# Patient Record
Sex: Female | Born: 1945
Health system: Southern US, Community
[De-identification: ages and names within clinical notes are randomized; demographics above are authoritative.]

## PROBLEM LIST (undated history)

## (undated) DIAGNOSIS — K8020 Calculus of gallbladder without cholecystitis without obstruction: Secondary | ICD-10-CM

## (undated) DIAGNOSIS — N951 Menopausal and female climacteric states: Secondary | ICD-10-CM

## (undated) DIAGNOSIS — N3001 Acute cystitis with hematuria: Secondary | ICD-10-CM

## (undated) DIAGNOSIS — Z1329 Encounter for screening for other suspected endocrine disorder: Secondary | ICD-10-CM

## (undated) DIAGNOSIS — J42 Unspecified chronic bronchitis: Secondary | ICD-10-CM

## (undated) DIAGNOSIS — F329 Major depressive disorder, single episode, unspecified: Secondary | ICD-10-CM

## (undated) DIAGNOSIS — M5430 Sciatica, unspecified side: Secondary | ICD-10-CM

## (undated) DIAGNOSIS — IMO0001 Reserved for inherently not codable concepts without codable children: Secondary | ICD-10-CM

## (undated) DIAGNOSIS — F32A Depression, unspecified: Secondary | ICD-10-CM

## (undated) DIAGNOSIS — J3800 Paralysis of vocal cords and larynx, unspecified: Secondary | ICD-10-CM

## (undated) DIAGNOSIS — R220 Localized swelling, mass and lump, head: Secondary | ICD-10-CM

## (undated) DIAGNOSIS — F419 Anxiety disorder, unspecified: Secondary | ICD-10-CM

## (undated) DIAGNOSIS — E785 Hyperlipidemia, unspecified: Secondary | ICD-10-CM

## (undated) DIAGNOSIS — D1800 Hemangioma unspecified site: Secondary | ICD-10-CM

## (undated) DIAGNOSIS — I10 Essential (primary) hypertension: Secondary | ICD-10-CM

## (undated) DIAGNOSIS — K219 Gastro-esophageal reflux disease without esophagitis: Secondary | ICD-10-CM

## (undated) DIAGNOSIS — G5600 Carpal tunnel syndrome, unspecified upper limb: Secondary | ICD-10-CM

## (undated) DIAGNOSIS — Z5189 Encounter for other specified aftercare: Secondary | ICD-10-CM

## (undated) DIAGNOSIS — J449 Chronic obstructive pulmonary disease, unspecified: Secondary | ICD-10-CM

## (undated) DIAGNOSIS — R221 Localized swelling, mass and lump, neck: Secondary | ICD-10-CM

## (undated) HISTORY — PX: LUMBAR LAMINECTOMY: SHX95

## (undated) HISTORY — DX: Calculus of gallbladder without cholecystitis without obstruction: K80.20

## (undated) HISTORY — PX: OTHER SURGICAL HISTORY: SHX169

## (undated) HISTORY — DX: Hemangioma unspecified site: D18.00

## (undated) HISTORY — DX: Essential (primary) hypertension: I10

## (undated) HISTORY — DX: Acute cystitis with hematuria: N30.01

## (undated) HISTORY — DX: Sciatica, unspecified side: M54.30

## (undated) HISTORY — PX: CARPAL TUNNEL RELEASE: SHX101

## (undated) HISTORY — PX: CHOLECYSTECTOMY: SHX55

## (undated) HISTORY — DX: Hyperlipidemia, unspecified: E78.5

## (undated) HISTORY — PX: TUBAL LIGATION: SHX77

## (undated) HISTORY — DX: Menopausal and female climacteric states: N95.1

## (undated) HISTORY — DX: Encounter for screening for other suspected endocrine disorder: Z13.29

## (undated) HISTORY — PX: ABDOMINAL HYSTERECTOMY: SHX81

## (undated) HISTORY — DX: Depression, unspecified: F32.A

## (undated) HISTORY — DX: Encounter for other specified aftercare: Z51.89

## (undated) HISTORY — DX: Unspecified chronic bronchitis: J42

## (undated) HISTORY — DX: Reserved for inherently not codable concepts without codable children: IMO0001

## (undated) HISTORY — DX: Chronic obstructive pulmonary disease, unspecified: J44.9

## (undated) HISTORY — DX: Major depressive disorder, single episode, unspecified: F32.9

## (undated) HISTORY — DX: Anxiety disorder, unspecified: F41.9

## (undated) HISTORY — DX: Carpal tunnel syndrome, unspecified upper limb: G56.00

## (undated) HISTORY — DX: Gastro-esophageal reflux disease without esophagitis: K21.9

## (undated) HISTORY — DX: Localized swelling, mass and lump, neck: R22.1

## (undated) HISTORY — DX: Localized swelling, mass and lump, head: R22.0

## (undated) HISTORY — DX: Paralysis of vocal cords and larynx, unspecified: J38.00

---

## 1997-05-27 ENCOUNTER — Encounter (INDEPENDENT_AMBULATORY_CARE_PROVIDER_SITE_OTHER): Payer: Self-pay | Admitting: *Deleted

## 1998-04-23 ENCOUNTER — Ambulatory Visit (HOSPITAL_COMMUNITY): Admission: RE | Admit: 1998-04-23 | Discharge: 1998-04-23 | Payer: Self-pay | Admitting: *Deleted

## 1998-05-22 ENCOUNTER — Encounter: Admission: RE | Admit: 1998-05-22 | Discharge: 1998-05-22 | Payer: Self-pay | Admitting: Sports Medicine

## 1998-06-07 ENCOUNTER — Encounter: Admission: RE | Admit: 1998-06-07 | Discharge: 1998-06-07 | Payer: Self-pay | Admitting: Family Medicine

## 1998-10-17 ENCOUNTER — Encounter: Admission: RE | Admit: 1998-10-17 | Discharge: 1998-10-17 | Payer: Self-pay | Admitting: Sports Medicine

## 1999-02-25 ENCOUNTER — Encounter: Admission: RE | Admit: 1999-02-25 | Discharge: 1999-02-25 | Payer: Self-pay | Admitting: Family Medicine

## 1999-02-25 ENCOUNTER — Ambulatory Visit (HOSPITAL_COMMUNITY): Admission: RE | Admit: 1999-02-25 | Discharge: 1999-02-25 | Payer: Self-pay | Admitting: Family Medicine

## 1999-04-03 ENCOUNTER — Encounter: Admission: RE | Admit: 1999-04-03 | Discharge: 1999-04-03 | Payer: Self-pay | Admitting: Family Medicine

## 1999-06-13 ENCOUNTER — Encounter: Admission: RE | Admit: 1999-06-13 | Discharge: 1999-06-13 | Payer: Self-pay | Admitting: Family Medicine

## 1999-06-21 ENCOUNTER — Ambulatory Visit (HOSPITAL_COMMUNITY): Admission: RE | Admit: 1999-06-21 | Discharge: 1999-06-21 | Payer: Self-pay | Admitting: Family Medicine

## 1999-06-21 ENCOUNTER — Encounter: Payer: Self-pay | Admitting: Family Medicine

## 1999-08-22 ENCOUNTER — Encounter: Admission: RE | Admit: 1999-08-22 | Discharge: 1999-08-22 | Payer: Self-pay | Admitting: Family Medicine

## 1999-09-11 ENCOUNTER — Encounter: Admission: RE | Admit: 1999-09-11 | Discharge: 1999-09-11 | Payer: Self-pay | Admitting: Family Medicine

## 1999-10-28 DIAGNOSIS — D1800 Hemangioma unspecified site: Secondary | ICD-10-CM

## 1999-10-28 HISTORY — DX: Hemangioma unspecified site: D18.00

## 1999-12-12 ENCOUNTER — Encounter: Admission: RE | Admit: 1999-12-12 | Discharge: 1999-12-12 | Payer: Self-pay | Admitting: Family Medicine

## 2000-01-03 ENCOUNTER — Encounter: Admission: RE | Admit: 2000-01-03 | Discharge: 2000-01-03 | Payer: Self-pay | Admitting: Family Medicine

## 2000-01-15 ENCOUNTER — Encounter: Payer: Self-pay | Admitting: Orthopedic Surgery

## 2000-01-15 ENCOUNTER — Encounter: Admission: RE | Admit: 2000-01-15 | Discharge: 2000-01-15 | Payer: Self-pay | Admitting: Orthopedic Surgery

## 2000-01-29 ENCOUNTER — Ambulatory Visit (HOSPITAL_COMMUNITY): Admission: RE | Admit: 2000-01-29 | Discharge: 2000-01-29 | Payer: Self-pay | Admitting: Orthopedic Surgery

## 2000-01-29 ENCOUNTER — Encounter: Payer: Self-pay | Admitting: Orthopedic Surgery

## 2000-04-22 ENCOUNTER — Encounter: Admission: RE | Admit: 2000-04-22 | Discharge: 2000-04-22 | Payer: Self-pay | Admitting: Family Medicine

## 2000-06-23 ENCOUNTER — Encounter: Payer: Self-pay | Admitting: Family Medicine

## 2000-06-23 ENCOUNTER — Ambulatory Visit (HOSPITAL_COMMUNITY): Admission: RE | Admit: 2000-06-23 | Discharge: 2000-06-23 | Payer: Self-pay | Admitting: Sports Medicine

## 2000-10-06 ENCOUNTER — Encounter: Admission: RE | Admit: 2000-10-06 | Discharge: 2000-10-06 | Payer: Self-pay | Admitting: Family Medicine

## 2000-10-09 ENCOUNTER — Encounter: Payer: Self-pay | Admitting: Family Medicine

## 2000-10-09 ENCOUNTER — Encounter: Admission: RE | Admit: 2000-10-09 | Discharge: 2000-10-09 | Payer: Self-pay | Admitting: Family Medicine

## 2000-11-10 ENCOUNTER — Encounter: Admission: RE | Admit: 2000-11-10 | Discharge: 2000-11-10 | Payer: Self-pay | Admitting: Sports Medicine

## 2000-12-08 ENCOUNTER — Encounter: Admission: RE | Admit: 2000-12-08 | Discharge: 2000-12-08 | Payer: Self-pay | Admitting: Family Medicine

## 2000-12-31 ENCOUNTER — Encounter: Admission: RE | Admit: 2000-12-31 | Discharge: 2000-12-31 | Payer: Self-pay | Admitting: Family Medicine

## 2001-03-31 ENCOUNTER — Encounter: Admission: RE | Admit: 2001-03-31 | Discharge: 2001-03-31 | Payer: Self-pay | Admitting: Family Medicine

## 2001-04-07 ENCOUNTER — Encounter: Admission: RE | Admit: 2001-04-07 | Discharge: 2001-05-26 | Payer: Self-pay | Admitting: Sports Medicine

## 2001-05-27 ENCOUNTER — Encounter: Admission: RE | Admit: 2001-05-27 | Discharge: 2001-08-25 | Payer: Self-pay | Admitting: Sports Medicine

## 2001-09-07 ENCOUNTER — Encounter: Admission: RE | Admit: 2001-09-07 | Discharge: 2001-09-07 | Payer: Self-pay | Admitting: Family Medicine

## 2001-09-07 ENCOUNTER — Encounter: Payer: Self-pay | Admitting: Sports Medicine

## 2001-09-07 ENCOUNTER — Encounter: Admission: RE | Admit: 2001-09-07 | Discharge: 2001-09-07 | Payer: Self-pay | Admitting: Sports Medicine

## 2001-09-13 ENCOUNTER — Encounter: Admission: RE | Admit: 2001-09-13 | Discharge: 2001-09-13 | Payer: Self-pay | Admitting: Family Medicine

## 2001-09-21 ENCOUNTER — Ambulatory Visit (HOSPITAL_COMMUNITY): Admission: RE | Admit: 2001-09-21 | Discharge: 2001-09-21 | Payer: Self-pay | Admitting: Sports Medicine

## 2001-10-05 ENCOUNTER — Encounter: Admission: RE | Admit: 2001-10-05 | Discharge: 2001-10-05 | Payer: Self-pay | Admitting: Family Medicine

## 2002-01-07 ENCOUNTER — Encounter: Admission: RE | Admit: 2002-01-07 | Discharge: 2002-01-07 | Payer: Self-pay | Admitting: Family Medicine

## 2002-02-10 ENCOUNTER — Encounter: Admission: RE | Admit: 2002-02-10 | Discharge: 2002-02-10 | Payer: Self-pay | Admitting: Family Medicine

## 2002-03-08 ENCOUNTER — Encounter: Admission: RE | Admit: 2002-03-08 | Discharge: 2002-03-08 | Payer: Self-pay | Admitting: Sports Medicine

## 2002-03-08 ENCOUNTER — Encounter: Payer: Self-pay | Admitting: Sports Medicine

## 2002-03-08 ENCOUNTER — Encounter: Admission: RE | Admit: 2002-03-08 | Discharge: 2002-03-08 | Payer: Self-pay | Admitting: Family Medicine

## 2002-05-17 ENCOUNTER — Encounter: Admission: RE | Admit: 2002-05-17 | Discharge: 2002-05-17 | Payer: Self-pay | Admitting: Family Medicine

## 2002-05-20 ENCOUNTER — Encounter: Admission: RE | Admit: 2002-05-20 | Discharge: 2002-05-20 | Payer: Self-pay | Admitting: Neurosurgery

## 2002-05-20 ENCOUNTER — Encounter: Payer: Self-pay | Admitting: Neurosurgery

## 2002-06-03 ENCOUNTER — Encounter: Payer: Self-pay | Admitting: Neurosurgery

## 2002-06-03 ENCOUNTER — Encounter: Admission: RE | Admit: 2002-06-03 | Discharge: 2002-06-03 | Payer: Self-pay | Admitting: Neurosurgery

## 2002-06-20 ENCOUNTER — Encounter: Admission: RE | Admit: 2002-06-20 | Discharge: 2002-06-20 | Payer: Self-pay | Admitting: Family Medicine

## 2003-02-14 ENCOUNTER — Encounter: Admission: RE | Admit: 2003-02-14 | Discharge: 2003-02-14 | Payer: Self-pay | Admitting: Sports Medicine

## 2003-04-27 ENCOUNTER — Encounter: Admission: RE | Admit: 2003-04-27 | Discharge: 2003-04-27 | Payer: Self-pay | Admitting: Family Medicine

## 2003-09-26 ENCOUNTER — Encounter (INDEPENDENT_AMBULATORY_CARE_PROVIDER_SITE_OTHER): Payer: Self-pay | Admitting: Specialist

## 2003-09-26 ENCOUNTER — Observation Stay (HOSPITAL_COMMUNITY): Admission: RE | Admit: 2003-09-26 | Discharge: 2003-09-27 | Payer: Self-pay | Admitting: Obstetrics & Gynecology

## 2003-10-28 DIAGNOSIS — J42 Unspecified chronic bronchitis: Secondary | ICD-10-CM

## 2003-10-28 HISTORY — DX: Unspecified chronic bronchitis: J42

## 2003-12-14 ENCOUNTER — Encounter: Admission: RE | Admit: 2003-12-14 | Discharge: 2003-12-14 | Payer: Self-pay | Admitting: Family Medicine

## 2004-02-05 ENCOUNTER — Encounter: Admission: RE | Admit: 2004-02-05 | Discharge: 2004-02-05 | Payer: Self-pay | Admitting: Family Medicine

## 2004-02-12 ENCOUNTER — Encounter: Admission: RE | Admit: 2004-02-12 | Discharge: 2004-02-12 | Payer: Self-pay | Admitting: Sports Medicine

## 2004-05-02 ENCOUNTER — Encounter: Admission: RE | Admit: 2004-05-02 | Discharge: 2004-05-02 | Payer: Self-pay | Admitting: Family Medicine

## 2004-08-30 ENCOUNTER — Ambulatory Visit: Payer: Self-pay | Admitting: Family Medicine

## 2004-09-18 ENCOUNTER — Ambulatory Visit: Payer: Self-pay | Admitting: Family Medicine

## 2004-10-14 ENCOUNTER — Ambulatory Visit: Payer: Self-pay | Admitting: Sports Medicine

## 2004-10-15 ENCOUNTER — Encounter: Admission: RE | Admit: 2004-10-15 | Discharge: 2004-10-15 | Payer: Self-pay | Admitting: Sports Medicine

## 2004-10-27 HISTORY — PX: UPPER GASTROINTESTINAL ENDOSCOPY: SHX188

## 2004-10-30 ENCOUNTER — Ambulatory Visit (HOSPITAL_COMMUNITY): Admission: RE | Admit: 2004-10-30 | Discharge: 2004-10-30 | Payer: Self-pay | Admitting: Gastroenterology

## 2004-11-08 ENCOUNTER — Ambulatory Visit: Payer: Self-pay | Admitting: Sports Medicine

## 2005-05-01 ENCOUNTER — Ambulatory Visit: Payer: Self-pay | Admitting: Family Medicine

## 2005-05-14 ENCOUNTER — Ambulatory Visit: Payer: Self-pay | Admitting: Family Medicine

## 2005-05-27 ENCOUNTER — Ambulatory Visit: Payer: Self-pay | Admitting: Psychology

## 2005-05-28 ENCOUNTER — Ambulatory Visit: Payer: Self-pay | Admitting: Family Medicine

## 2005-06-03 ENCOUNTER — Ambulatory Visit: Payer: Self-pay | Admitting: Family Medicine

## 2005-07-16 ENCOUNTER — Encounter: Admission: RE | Admit: 2005-07-16 | Discharge: 2005-07-16 | Payer: Self-pay | Admitting: Sports Medicine

## 2005-10-03 ENCOUNTER — Ambulatory Visit: Payer: Self-pay | Admitting: Family Medicine

## 2005-11-17 ENCOUNTER — Ambulatory Visit: Payer: Self-pay | Admitting: Sports Medicine

## 2005-11-28 ENCOUNTER — Encounter: Admission: RE | Admit: 2005-11-28 | Discharge: 2005-11-28 | Payer: Self-pay | Admitting: Family Medicine

## 2006-04-29 ENCOUNTER — Emergency Department (HOSPITAL_COMMUNITY): Admission: EM | Admit: 2006-04-29 | Discharge: 2006-04-29 | Payer: Self-pay | Admitting: Emergency Medicine

## 2006-07-15 ENCOUNTER — Ambulatory Visit: Payer: Self-pay | Admitting: Family Medicine

## 2006-07-23 ENCOUNTER — Ambulatory Visit: Payer: Self-pay | Admitting: Family Medicine

## 2006-11-26 ENCOUNTER — Ambulatory Visit: Payer: Self-pay | Admitting: Family Medicine

## 2006-12-23 ENCOUNTER — Ambulatory Visit: Payer: Self-pay | Admitting: Family Medicine

## 2006-12-23 ENCOUNTER — Encounter (INDEPENDENT_AMBULATORY_CARE_PROVIDER_SITE_OTHER): Payer: Self-pay | Admitting: Family Medicine

## 2006-12-23 ENCOUNTER — Ambulatory Visit (HOSPITAL_COMMUNITY): Admission: RE | Admit: 2006-12-23 | Discharge: 2006-12-23 | Payer: Self-pay | Admitting: Family Medicine

## 2006-12-23 LAB — CONVERTED CEMR LAB
ALT: 11 units/L (ref 0–35)
Albumin: 3.9 g/dL (ref 3.5–5.2)
Alkaline Phosphatase: 69 units/L (ref 39–117)
CO2: 28 meq/L (ref 19–32)
Calcium: 9.1 mg/dL (ref 8.4–10.5)
Chloride: 105 meq/L (ref 96–112)
Cholesterol: 193 mg/dL (ref 0–200)
HDL: 41 mg/dL (ref >39)
LDL Cholesterol: 116 mg/dL — ABNORMAL HIGH (ref 0–99)
TSH: 2.065 microintl units/mL (ref 0.350–5.50)
Total Protein: 6.7 g/dL (ref 6.0–8.3)
Triglycerides: 181 mg/dL — ABNORMAL HIGH (ref <150)

## 2006-12-24 DIAGNOSIS — M545 Low back pain, unspecified: Secondary | ICD-10-CM | POA: Insufficient documentation

## 2006-12-24 DIAGNOSIS — K219 Gastro-esophageal reflux disease without esophagitis: Secondary | ICD-10-CM

## 2006-12-24 DIAGNOSIS — R002 Palpitations: Secondary | ICD-10-CM | POA: Insufficient documentation

## 2006-12-24 DIAGNOSIS — G5600 Carpal tunnel syndrome, unspecified upper limb: Secondary | ICD-10-CM

## 2006-12-24 DIAGNOSIS — M1990 Unspecified osteoarthritis, unspecified site: Secondary | ICD-10-CM | POA: Insufficient documentation

## 2006-12-24 DIAGNOSIS — J45909 Unspecified asthma, uncomplicated: Secondary | ICD-10-CM | POA: Insufficient documentation

## 2006-12-24 DIAGNOSIS — F339 Major depressive disorder, recurrent, unspecified: Secondary | ICD-10-CM | POA: Insufficient documentation

## 2006-12-24 DIAGNOSIS — I10 Essential (primary) hypertension: Secondary | ICD-10-CM | POA: Insufficient documentation

## 2006-12-24 DIAGNOSIS — E669 Obesity, unspecified: Secondary | ICD-10-CM | POA: Insufficient documentation

## 2006-12-24 DIAGNOSIS — F411 Generalized anxiety disorder: Secondary | ICD-10-CM | POA: Insufficient documentation

## 2006-12-24 DIAGNOSIS — N951 Menopausal and female climacteric states: Secondary | ICD-10-CM

## 2006-12-24 DIAGNOSIS — N3941 Urge incontinence: Secondary | ICD-10-CM | POA: Insufficient documentation

## 2006-12-24 HISTORY — DX: Menopausal and female climacteric states: N95.1

## 2006-12-24 HISTORY — DX: Gastro-esophageal reflux disease without esophagitis: K21.9

## 2006-12-24 HISTORY — DX: Carpal tunnel syndrome, unspecified upper limb: G56.00

## 2006-12-25 ENCOUNTER — Encounter (INDEPENDENT_AMBULATORY_CARE_PROVIDER_SITE_OTHER): Payer: Self-pay | Admitting: *Deleted

## 2006-12-28 ENCOUNTER — Ambulatory Visit (HOSPITAL_COMMUNITY): Admission: RE | Admit: 2006-12-28 | Discharge: 2006-12-28 | Payer: Self-pay | Admitting: Family Medicine

## 2006-12-29 ENCOUNTER — Encounter (INDEPENDENT_AMBULATORY_CARE_PROVIDER_SITE_OTHER): Payer: Self-pay | Admitting: *Deleted

## 2006-12-29 DIAGNOSIS — R221 Localized swelling, mass and lump, neck: Secondary | ICD-10-CM

## 2006-12-29 DIAGNOSIS — R220 Localized swelling, mass and lump, head: Secondary | ICD-10-CM | POA: Insufficient documentation

## 2006-12-29 HISTORY — DX: Localized swelling, mass and lump, neck: R22.1

## 2006-12-29 HISTORY — DX: Localized swelling, mass and lump, head: R22.0

## 2006-12-31 ENCOUNTER — Ambulatory Visit: Payer: Self-pay | Admitting: Sports Medicine

## 2006-12-31 DIAGNOSIS — E28319 Asymptomatic premature menopause: Secondary | ICD-10-CM | POA: Insufficient documentation

## 2007-01-01 ENCOUNTER — Encounter (INDEPENDENT_AMBULATORY_CARE_PROVIDER_SITE_OTHER): Payer: Self-pay | Admitting: Pharmacist

## 2007-01-08 ENCOUNTER — Ambulatory Visit: Payer: Self-pay | Admitting: Sports Medicine

## 2007-01-08 DIAGNOSIS — M722 Plantar fascial fibromatosis: Secondary | ICD-10-CM | POA: Insufficient documentation

## 2007-01-21 ENCOUNTER — Encounter (INDEPENDENT_AMBULATORY_CARE_PROVIDER_SITE_OTHER): Payer: Self-pay | Admitting: Family Medicine

## 2007-02-03 ENCOUNTER — Ambulatory Visit: Payer: Self-pay | Admitting: Sports Medicine

## 2007-02-03 DIAGNOSIS — M25579 Pain in unspecified ankle and joints of unspecified foot: Secondary | ICD-10-CM | POA: Insufficient documentation

## 2007-03-01 ENCOUNTER — Encounter (INDEPENDENT_AMBULATORY_CARE_PROVIDER_SITE_OTHER): Payer: Self-pay | Admitting: Family Medicine

## 2007-03-10 ENCOUNTER — Telehealth: Payer: Self-pay | Admitting: *Deleted

## 2007-03-15 ENCOUNTER — Encounter (INDEPENDENT_AMBULATORY_CARE_PROVIDER_SITE_OTHER): Payer: Self-pay | Admitting: Family Medicine

## 2007-04-05 ENCOUNTER — Encounter (INDEPENDENT_AMBULATORY_CARE_PROVIDER_SITE_OTHER): Payer: Self-pay | Admitting: Family Medicine

## 2007-04-09 ENCOUNTER — Ambulatory Visit: Payer: Self-pay | Admitting: Family Medicine

## 2007-04-16 ENCOUNTER — Encounter (INDEPENDENT_AMBULATORY_CARE_PROVIDER_SITE_OTHER): Payer: Self-pay | Admitting: Family Medicine

## 2007-04-22 ENCOUNTER — Encounter: Payer: Self-pay | Admitting: *Deleted

## 2007-05-03 ENCOUNTER — Encounter: Payer: Self-pay | Admitting: *Deleted

## 2007-05-05 ENCOUNTER — Encounter (INDEPENDENT_AMBULATORY_CARE_PROVIDER_SITE_OTHER): Payer: Self-pay | Admitting: Family Medicine

## 2007-05-06 ENCOUNTER — Encounter (INDEPENDENT_AMBULATORY_CARE_PROVIDER_SITE_OTHER): Payer: Self-pay | Admitting: Family Medicine

## 2007-05-06 ENCOUNTER — Ambulatory Visit (HOSPITAL_COMMUNITY): Admission: RE | Admit: 2007-05-06 | Discharge: 2007-05-06 | Payer: Self-pay | Admitting: Family Medicine

## 2007-05-06 ENCOUNTER — Ambulatory Visit: Payer: Self-pay | Admitting: Sports Medicine

## 2007-05-08 ENCOUNTER — Emergency Department (HOSPITAL_COMMUNITY): Admission: EM | Admit: 2007-05-08 | Discharge: 2007-05-08 | Payer: Self-pay | Admitting: Emergency Medicine

## 2007-05-10 ENCOUNTER — Encounter (INDEPENDENT_AMBULATORY_CARE_PROVIDER_SITE_OTHER): Payer: Self-pay | Admitting: Family Medicine

## 2007-05-12 ENCOUNTER — Encounter (INDEPENDENT_AMBULATORY_CARE_PROVIDER_SITE_OTHER): Payer: Self-pay | Admitting: Family Medicine

## 2007-05-28 ENCOUNTER — Encounter (INDEPENDENT_AMBULATORY_CARE_PROVIDER_SITE_OTHER): Payer: Self-pay | Admitting: Family Medicine

## 2007-06-11 ENCOUNTER — Encounter (INDEPENDENT_AMBULATORY_CARE_PROVIDER_SITE_OTHER): Payer: Self-pay | Admitting: Family Medicine

## 2007-08-02 ENCOUNTER — Encounter (INDEPENDENT_AMBULATORY_CARE_PROVIDER_SITE_OTHER): Payer: Self-pay | Admitting: Family Medicine

## 2007-08-24 ENCOUNTER — Encounter: Admission: RE | Admit: 2007-08-24 | Discharge: 2007-08-24 | Payer: Self-pay | Admitting: Sports Medicine

## 2007-08-31 ENCOUNTER — Encounter: Admission: RE | Admit: 2007-08-31 | Discharge: 2007-08-31 | Payer: Self-pay | Admitting: Sports Medicine

## 2007-09-13 ENCOUNTER — Encounter (INDEPENDENT_AMBULATORY_CARE_PROVIDER_SITE_OTHER): Payer: Self-pay | Admitting: Family Medicine

## 2007-09-13 LAB — CONVERTED CEMR LAB
BUN: 15 mg/dL
CO2: 27 meq/L
GFR calc non Af Amer: >60 mL/min
Hgb A1c MFr Bld: 5.5 %

## 2007-09-22 ENCOUNTER — Encounter (INDEPENDENT_AMBULATORY_CARE_PROVIDER_SITE_OTHER): Payer: Self-pay | Admitting: Family Medicine

## 2007-10-14 ENCOUNTER — Ambulatory Visit: Payer: Self-pay | Admitting: Family Medicine

## 2007-10-18 ENCOUNTER — Encounter (INDEPENDENT_AMBULATORY_CARE_PROVIDER_SITE_OTHER): Payer: Self-pay | Admitting: Family Medicine

## 2007-11-01 ENCOUNTER — Encounter (INDEPENDENT_AMBULATORY_CARE_PROVIDER_SITE_OTHER): Payer: Self-pay | Admitting: Family Medicine

## 2007-11-11 ENCOUNTER — Encounter (INDEPENDENT_AMBULATORY_CARE_PROVIDER_SITE_OTHER): Payer: Self-pay | Admitting: Family Medicine

## 2007-11-24 ENCOUNTER — Encounter (INDEPENDENT_AMBULATORY_CARE_PROVIDER_SITE_OTHER): Payer: Self-pay | Admitting: Family Medicine

## 2007-11-26 ENCOUNTER — Ambulatory Visit: Payer: Self-pay | Admitting: Family Medicine

## 2007-11-30 ENCOUNTER — Telehealth (INDEPENDENT_AMBULATORY_CARE_PROVIDER_SITE_OTHER): Payer: Self-pay | Admitting: Family Medicine

## 2007-12-28 ENCOUNTER — Encounter (INDEPENDENT_AMBULATORY_CARE_PROVIDER_SITE_OTHER): Payer: Self-pay | Admitting: Family Medicine

## 2007-12-28 LAB — CONVERTED CEMR LAB
BUN: 14 mg/dL
CO2: 31 meq/L
GFR calc non Af Amer: >60 mL/min
Glucose, Bld: 92 mg/dL
Potassium: 4.2 meq/L

## 2007-12-29 ENCOUNTER — Encounter (INDEPENDENT_AMBULATORY_CARE_PROVIDER_SITE_OTHER): Payer: Self-pay | Admitting: Family Medicine

## 2008-01-19 ENCOUNTER — Encounter (INDEPENDENT_AMBULATORY_CARE_PROVIDER_SITE_OTHER): Payer: Self-pay | Admitting: Family Medicine

## 2008-02-21 ENCOUNTER — Encounter (INDEPENDENT_AMBULATORY_CARE_PROVIDER_SITE_OTHER): Payer: Self-pay | Admitting: Family Medicine

## 2008-03-09 ENCOUNTER — Telehealth: Payer: Self-pay | Admitting: *Deleted

## 2008-04-24 ENCOUNTER — Encounter (INDEPENDENT_AMBULATORY_CARE_PROVIDER_SITE_OTHER): Payer: Self-pay | Admitting: Family Medicine

## 2008-05-08 ENCOUNTER — Encounter (INDEPENDENT_AMBULATORY_CARE_PROVIDER_SITE_OTHER): Payer: Self-pay | Admitting: Family Medicine

## 2008-05-22 ENCOUNTER — Encounter (INDEPENDENT_AMBULATORY_CARE_PROVIDER_SITE_OTHER): Payer: Self-pay | Admitting: Family Medicine

## 2008-06-19 ENCOUNTER — Encounter (INDEPENDENT_AMBULATORY_CARE_PROVIDER_SITE_OTHER): Payer: Self-pay | Admitting: Family Medicine

## 2008-10-30 ENCOUNTER — Telehealth: Payer: Self-pay | Admitting: *Deleted

## 2008-10-31 ENCOUNTER — Ambulatory Visit: Payer: Self-pay | Admitting: Family Medicine

## 2008-10-31 DIAGNOSIS — J019 Acute sinusitis, unspecified: Secondary | ICD-10-CM | POA: Insufficient documentation

## 2008-11-08 ENCOUNTER — Ambulatory Visit (HOSPITAL_COMMUNITY): Admission: RE | Admit: 2008-11-08 | Discharge: 2008-11-08 | Payer: Self-pay | Admitting: Family Medicine

## 2008-11-08 ENCOUNTER — Telehealth: Payer: Self-pay | Admitting: *Deleted

## 2008-11-08 ENCOUNTER — Ambulatory Visit: Payer: Self-pay | Admitting: Family Medicine

## 2008-11-08 DIAGNOSIS — J13 Pneumonia due to Streptococcus pneumoniae: Secondary | ICD-10-CM | POA: Insufficient documentation

## 2009-02-20 ENCOUNTER — Encounter: Admission: RE | Admit: 2009-02-20 | Discharge: 2009-02-20 | Payer: Self-pay | Admitting: Family Medicine

## 2009-02-20 ENCOUNTER — Ambulatory Visit: Payer: Self-pay | Admitting: Family Medicine

## 2009-02-20 ENCOUNTER — Encounter (INDEPENDENT_AMBULATORY_CARE_PROVIDER_SITE_OTHER): Payer: Self-pay | Admitting: Family Medicine

## 2009-02-20 ENCOUNTER — Telehealth (INDEPENDENT_AMBULATORY_CARE_PROVIDER_SITE_OTHER): Payer: Self-pay | Admitting: *Deleted

## 2009-02-21 LAB — CONVERTED CEMR LAB
HCT: 37.7 % (ref 36.0–46.0)
Hemoglobin: 12.5 g/dL (ref 12.0–15.0)
MCV: 92.6 fL (ref 78.0–100.0)
Neutrophils Relative %: 34 % — ABNORMAL LOW (ref 43–77)
Platelets: 183 10*3/uL (ref 150–400)
WBC: 3.5 10*3/uL — ABNORMAL LOW (ref 4.0–10.5)

## 2009-04-18 ENCOUNTER — Encounter (INDEPENDENT_AMBULATORY_CARE_PROVIDER_SITE_OTHER): Payer: Self-pay | Admitting: Family Medicine

## 2009-04-18 ENCOUNTER — Ambulatory Visit: Payer: Self-pay | Admitting: Family Medicine

## 2009-04-18 LAB — CONVERTED CEMR LAB
AST: 15 units/L (ref 0–37)
Albumin: 3.9 g/dL (ref 3.5–5.2)
Alkaline Phosphatase: 66 units/L (ref 39–117)
BUN: 17 mg/dL (ref 6–23)
CO2: 26 meq/L (ref 19–32)
Calcium: 9.1 mg/dL (ref 8.4–10.5)
Glucose, Bld: 101 mg/dL — ABNORMAL HIGH (ref 70–99)
Potassium: 4.1 meq/L (ref 3.5–5.3)
Total Bilirubin: 0.2 mg/dL — ABNORMAL LOW (ref 0.3–1.2)

## 2009-04-19 ENCOUNTER — Encounter (INDEPENDENT_AMBULATORY_CARE_PROVIDER_SITE_OTHER): Payer: Self-pay | Admitting: *Deleted

## 2009-04-19 DIAGNOSIS — D1800 Hemangioma unspecified site: Secondary | ICD-10-CM | POA: Insufficient documentation

## 2009-04-23 ENCOUNTER — Encounter (INDEPENDENT_AMBULATORY_CARE_PROVIDER_SITE_OTHER): Payer: Self-pay | Admitting: Family Medicine

## 2009-04-24 ENCOUNTER — Ambulatory Visit (HOSPITAL_COMMUNITY): Admission: RE | Admit: 2009-04-24 | Discharge: 2009-04-24 | Payer: Self-pay | Admitting: Family Medicine

## 2009-04-25 ENCOUNTER — Telehealth (INDEPENDENT_AMBULATORY_CARE_PROVIDER_SITE_OTHER): Payer: Self-pay | Admitting: *Deleted

## 2009-04-25 DIAGNOSIS — K8020 Calculus of gallbladder without cholecystitis without obstruction: Secondary | ICD-10-CM | POA: Insufficient documentation

## 2009-04-25 HISTORY — DX: Calculus of gallbladder without cholecystitis without obstruction: K80.20

## 2009-05-02 ENCOUNTER — Encounter: Payer: Self-pay | Admitting: Family Medicine

## 2009-07-04 ENCOUNTER — Encounter: Payer: Self-pay | Admitting: Family Medicine

## 2009-08-08 ENCOUNTER — Encounter: Payer: Self-pay | Admitting: Family Medicine

## 2009-11-28 ENCOUNTER — Encounter: Payer: Self-pay | Admitting: Family Medicine

## 2010-07-30 ENCOUNTER — Encounter: Payer: Self-pay | Admitting: Family Medicine

## 2010-09-12 ENCOUNTER — Encounter: Payer: Self-pay | Admitting: Family Medicine

## 2010-11-14 ENCOUNTER — Encounter: Payer: Self-pay | Admitting: Family Medicine

## 2010-11-14 ENCOUNTER — Ambulatory Visit
Admission: RE | Admit: 2010-11-14 | Discharge: 2010-11-14 | Payer: Self-pay | Source: Home / Self Care | Attending: Family Medicine | Admitting: Family Medicine

## 2010-11-14 DIAGNOSIS — J069 Acute upper respiratory infection, unspecified: Secondary | ICD-10-CM | POA: Insufficient documentation

## 2010-11-18 ENCOUNTER — Telehealth: Payer: Self-pay | Admitting: *Deleted

## 2010-11-25 ENCOUNTER — Encounter: Payer: Self-pay | Admitting: Family Medicine

## 2010-11-28 NOTE — Consult Note (Signed)
Summary: [facility]  [facility]   Imported By: Clydell Hakim 08/12/2010 15:24:32  _____________________________________________________________________  External Attachment:    Type:   Image     Comment:   External Document

## 2010-11-28 NOTE — Consult Note (Signed)
Summary: [facility]  [facility]   Imported By: Marily Memos 09/26/2010 10:10:58  _____________________________________________________________________  External Attachment:    Type:   Image     Comment:   External Document

## 2010-11-28 NOTE — Consult Note (Signed)
Summary: [facility]: Thyroid US in May, 2011  [facility]   Imported By: Clydell Hakim 12/19/2009 15:27:08  _____________________________________________________________________  External Attachment:    Type:   Image     Comment:   External Document

## 2010-11-28 NOTE — Letter (Signed)
Summary: Out of Work  [facility]  [address]   [place], [place] [address]   Phone: [phone]  Fax: [phone]    November 14, 2010   Employee:  [employer]AP[employer]    To Whom It May Concern:   For Medical reasons, please excuse the above named employee from work for the following dates:  Start:   November 14, 2010  End:   November 14, 2010  If you need additional information, please feel free to contact our office.         Sincerely,    Milinda Antis MD

## 2010-11-28 NOTE — Progress Notes (Signed)
Summary: MR/TS  ---- Converted from flag ---- ---- 11/14/2010 8:43 PM, Milinda Antis MD wrote: Can we get the hospital discharge from [facility] and her most recent labs. We already get other correspondence from [facility]. I am missing the most recent note ------------------------------  called and request above to be faxed to Dr.Quartzsite's attention.

## 2010-11-28 NOTE — Assessment & Plan Note (Signed)
Summary: flu sx,df   Vital Signs:  Patient profile:   65 year old female Height:      63 inches Weight:      193 pounds BMI:     34.31 Temp:     98.1 degrees F oral Pulse rate:   87 / minute BP sitting:   148 / 83  (left arm)  Vitals Entered By: Tessie Fass CMA (November 14, 2010 10:40 AM)  Serial Vital Signs/Assessments:  Time      Position  BP       Pulse  Resp  Temp     By                     144/88                         Milinda Antis MD  CC: flu like symptoms Pain Assessment Patient in pain? yes     Location: head Intensity: 6   Primary Care Provider:  Jamie Brookes MD  CC:  flu like symptoms.  History of Present Illness:    +nasal drip and congestion x 1 week , history of chronic congestion and sinutis, since intiial surgery, +subjective fever, +chills, cough with a chronic sputum production , this morning had blood streaked sputum, no chest pain, no SOB , no nausea,body aches   using night-time cold medication  using nasal saline   Taking Tylenol, drinking fluids Works at [employer]   Had [place] removed on 12/20 has not had follow-up  +Flu shot   Current Medications (verified): 1)  Advair Diskus 250-50 Mcg/dose Misc (Fluticasone-Salmeterol) .... Inhale 1 Puff As Directed Twice A Day 2)  Albuterol 90 Mcg/act Aers (Albuterol) .... Inhale 2 Puff Using Inhaler Every Four Hours 3)  Mobic 7.5 Mg Tabs (Meloxicam) .... Take 1 Tablet By Mouth Once A Day 4)  Sm Calcium/vitamin D 500-200 Mg-Unit Tabs (Calcium Carbonate-Vitamin D) .... Take 1 Tablet By Mouth Twice A Day 5)  Zoloft 100 Mg Tabs (Sertraline Hcl) .... Take 1 Tablet By Mouth Once A Day 6)  Neurontin 300 Mg  Caps (Gabapentin) .Marland Kitchen.. 1 Tab By Mouth Three Times A Day 7)  Omeprazole 20 Mg Cpdr (Omeprazole) .Marland Kitchen.. 1 By Mouth Two Times A Day 8)  Lisinopril 10 Mg  Tabs (Lisinopril) .... Take 1 Tab By Mouth Daily 9)  Hydrochlorothiazide 25 Mg Tabs (Hydrochlorothiazide) .... Take 1 Tablet By Mouth Once A  Day 10)  Trazodone Hcl 50 Mg Tabs (Trazodone Hcl) .Marland Kitchen.. 1 Tab By Mouth At Bedtime 11)  Augmentin 875-125 Mg Tabs (Amoxicillin-Pot Clavulanate) .Marland Kitchen.. 1 By Mouth Two Times A Day X 10 Days 12)  Nasonex 50 Mcg/act Susp (Mometasone Furoate) .Marland Kitchen.. 1 Spray Each Nostril Daily 13)  Guiatuss Ac 100-10 Mg/33ml Syrp (Guaifenesin-Codeine) .... Take 1-2 Teaspoons By Mouth Q 6 Hours As Needed Cough Qs 2 Weeks  Allergies (verified): No Known Drug Allergies  Past History:  Past Medical History: C6-7 Degenerative Disk Disease w/ spurring, bulgin, Focal Calcific density R breast History of Fatty Liver (Ultrasound 6-98) Hospitalized in 1990 for depression/SI--Charter S/P removal of Bartholin`s cyst-2005-[place] HTN Bronchitis  Depression/anxiety Insomnia   Past Surgical History: Bladder repair 1986/1989 - 02/06/2000 Carpal tunnel surgery in 1989/2000 - 02/06/2000 cystoscopy for stones in 1990 - 02/06/2000 EGD-HH with lower esoph. Ring S/P dilatation, mild gastritis - 10/27/2004 hysterectomy secondary to DUB 1986 - lumbar laminectomy 1891 - ? vocal cord surgery Cholecystectomy - laproscopic 10/16/2011-- LKG  Review of Systems       per HPI  Physical Exam  General:  Hourse quality to voice ( previous surgery) Vital signs noted BP rechecked Eyes:  PERRL, clear conjuncitva/sclera Ears:  TM Clear bilat, canals clear Nose:  mucosal erythema, mucosal edema, L maxillary sinus tenderness, and R maxillary sinus tenderness.  clear discharge Mouth:  post nasal drip mild erythema no exudates Neck:  mild chronic tenderness right side of neck, no LAD Lungs:  upper airway congestion otherwise CTAB Heart:  RRR, no murmur Abdomen:  soft, normal bowel sounds, and no distention.  mild TTP over epigastric port site- port incisions well healed   Impression & Recommendations:  Problem # 1:  SINUSITIS, ACUTE (ICD-461.9) Assessment New with chronic vocal cord paralysis and high risk for sinus infection  secondary to surgical intervention will treat current symptoms Nasal steroid for inflammation Her updated medication list for this problem includes:    Augmentin 875-125 Mg Tabs (Amoxicillin-pot clavulanate) .Marland Kitchen... 1 by mouth two times a day x 10 days    Nasonex 50 Mcg/act Susp (Mometasone furoate) .Marland Kitchen... 1 spray each nostril daily    Guiatuss Ac 100-10 Mg/32ml Syrp (Guaifenesin-codeine) .Marland Kitchen... Take 1-2 teaspoons by mouth q 6 hours as needed cough qs 2 weeks  Orders: [facility]- Est  Level 4 (14782)  Problem # 2:  VIRAL URI (ICD-465.9) Assessment: New Likley viral illness however with sinusitis and recent hospitalization antibiotics per above. Her updated medication list for this problem includes:    Mobic 7.5 Mg Tabs (Meloxicam) .Marland Kitchen... Take 1 tablet by mouth once a day    Guiatuss Ac 100-10 Mg/39ml Syrp (Guaifenesin-codeine) .Marland Kitchen... Take 1-2 teaspoons by mouth q 6 hours as needed cough qs 2 weeks  Orders: [facility]- Est  Level 4 ([address]21)  Problem # 3:  HYPERTENSION, BENIGN SYSTEMIC (ICD-401.1) Assessment: Deteriorated pt to f/u with PCP No meds taken today will contact [facility] for recent labs and d/c summary Her updated medication list for this problem includes:    Lisinopril 10 Mg Tabs (Lisinopril) .Marland Kitchen... Take 1 tab by mouth daily    Hydrochlorothiazide 25 Mg Tabs (Hydrochlorothiazide) .Marland Kitchen... Take 1 tablet by mouth once a day  Orders: [facility]- Est  Level 4 (30865)  Complete Medication List: 1)  Advair Diskus 250-50 Mcg/dose Misc (Fluticasone-salmeterol) .... Inhale 1 puff as directed twice a day 2)  Albuterol 90 Mcg/act Aers (Albuterol) .... Inhale 2 puff using inhaler every four hours 3)  Mobic 7.5 Mg Tabs (Meloxicam) .... Take 1 tablet by mouth once a day 4)  Sm Calcium/vitamin D 500-200 Mg-unit Tabs (Calcium carbonate-vitamin d) .... Take 1 tablet by mouth twice a day 5)  Zoloft 100 Mg Tabs (Sertraline hcl) .... Take 1 tablet by mouth once a day 6)  Neurontin 300 Mg Caps (Gabapentin) .Marland Kitchen.. 1 tab by mouth  three times a day 7)  Omeprazole 20 Mg Cpdr (Omeprazole) .Marland Kitchen.. 1 by mouth two times a day 8)  Lisinopril 10 Mg Tabs (Lisinopril) .... Take 1 tab by mouth daily 9)  Hydrochlorothiazide 25 Mg Tabs (Hydrochlorothiazide) .... Take 1 tablet by mouth once a day 10)  Trazodone Hcl 50 Mg Tabs (Trazodone hcl) .Marland Kitchen.. 1 tab by mouth at bedtime 11)  Augmentin 875-125 Mg Tabs (Amoxicillin-pot clavulanate) .Marland Kitchen.. 1 by mouth two times a day x 10 days 12)  Nasonex 50 Mcg/act Susp (Mometasone furoate) .Marland Kitchen.. 1 spray each nostril daily 13)  Guiatuss Ac 100-10 Mg/17ml Syrp (Guaifenesin-codeine) .... Take 1-2 teaspoons by mouth q 6 hours as  needed cough qs 2 weeks  Patient Instructions: 1)  If no improvement call back to be rechecked 2)  Take the anibiotics as prescribed 3)  Use the nasal Spray  4)  Use the cough medicine as needed  5)  Make a follow-up appt for your blood pressure  Prescriptions: GUIATUSS AC 100-10 MG/5ML SYRP (GUAIFENESIN-CODEINE) Take 1-2 teaspoons by mouth q 6 hours as needed cough QS 2 weeks  #1 x 0   Entered and Authorized by:   Milinda Antis MD   Signed by:   Milinda Antis MD on 11/14/2010   Method used:   Handwritten   RxID:   [id number] NASONEX 50 MCG/ACT SUSP (MOMETASONE FUROATE) 1 spray each nostril daily  #1 x 2   Entered and Authorized by:   Milinda Antis MD   Signed by:   Milinda Antis MD on 11/14/2010   Method used:   Electronically to        [facility] (retail)       [address]       [place], [place]  [address]       Ph: [phone]       Fax: [phone]   RxID:   [id number] AUGMENTIN 875-125 MG TABS (AMOXICILLIN-POT CLAVULANATE) 1 by mouth two times a day x 10 days  #20 x 0   Entered and Authorized by:   Milinda Antis MD   Signed by:   Milinda Antis MD on 11/14/2010   Method used:   Electronically to        [facility] (retail)       [address]       [place], [place]  [address]        Ph: [phone]       Fax: [phone]   RxID:   [id number]    Orders Added: 1)  [facility]- Est  Level 4 [[address]]

## 2010-12-12 NOTE — Consult Note (Signed)
[facility]: [facility]  [facility]   Imported By: De Nurse 12/03/2010 10:56:13  _____________________________________________________________________  External Attachment:    Type:   Image     Comment:   External Document

## 2010-12-20 ENCOUNTER — Telehealth: Payer: Self-pay | Admitting: Family Medicine

## 2010-12-20 NOTE — Telephone Encounter (Signed)
Ms. Andrea Buck need all her meds except her inhalers to be filled to [facility] on [address].  Just got out of hospital.  Called [facility] to have them fax the info to [place] first and was told they didn't have list of her meds.

## 2010-12-20 NOTE — Telephone Encounter (Signed)
Sent to me in error. 

## 2010-12-24 MED ORDER — ALBUTEROL 90 MCG/ACT IN AERS: 2.0000 | INHALATION_SPRAY | RESPIRATORY_TRACT | Status: DC

## 2010-12-24 MED ORDER — FLUTICASONE-SALMETEROL 250-50 MCG/DOSE IN AEPB: 1.0000 | INHALATION_SPRAY | Freq: Two times a day (BID) | RESPIRATORY_TRACT | Status: DC

## 2010-12-24 NOTE — Telephone Encounter (Signed)
Advair and albuterol refilled.  

## 2010-12-31 ENCOUNTER — Telehealth: Payer: Self-pay | Admitting: Family Medicine

## 2010-12-31 NOTE — Telephone Encounter (Signed)
pcp is strother

## 2010-12-31 NOTE — Telephone Encounter (Signed)
Ms. Andrea Buck did not want her inhaler's refilled as stated in previous note.  She wanted all her other meds called to [facility] on [address].  Please inform pt when all other meds have been refilled

## 2011-01-05 NOTE — Telephone Encounter (Signed)
Can you verify that this patient got her prescriptions changed to [facility] on [address]?  I have a note from March 6th saying that she wanted her Rx's changed to [facility] on [address] and it looks like Earnest Bailey did it but just trying to make sure. Thanks.

## 2011-01-06 NOTE — Telephone Encounter (Signed)
Spoke with patient and she is going to call the [facility] on [address] and see if they were switched. She has not checked since Wednesday of last week

## 2011-01-29 ENCOUNTER — Encounter: Payer: Self-pay | Admitting: Home Health Services

## 2011-02-03 ENCOUNTER — Telehealth: Payer: Self-pay | Admitting: Family Medicine

## 2011-02-03 NOTE — Telephone Encounter (Signed)
Will route to her PCP who is in clinic tomorrow afternoon.

## 2011-02-03 NOTE — Telephone Encounter (Signed)
Ms. Andrea Buck calling to say that she has always had [facility] on [address] as her pharmacy and wanted to have refills on her meds except her inhalers and the omeprazole.  Here is the list of meds needed for refill:  Gabapentin Hctz Lisinopril  Mobic Nasonex Ambien 10 mgs Zoloft Trazadone  She has been out of these meds for several weeks. Please call her when filled.

## 2011-02-04 ENCOUNTER — Other Ambulatory Visit: Payer: Self-pay | Admitting: Family Medicine

## 2011-02-04 DIAGNOSIS — G4700 Insomnia, unspecified: Secondary | ICD-10-CM

## 2011-02-04 MED ORDER — MOMETASONE FUROATE 50 MCG/ACT NA SUSP: 1.0000 | Freq: Every day | NASAL | Status: DC

## 2011-02-04 MED ORDER — SERTRALINE HCL 100 MG PO TABS: 100.0000 mg | ORAL_TABLET | Freq: Every day | ORAL | Status: DC

## 2011-02-04 MED ORDER — LISINOPRIL 10 MG PO TABS: 10.0000 mg | ORAL_TABLET | Freq: Every day | ORAL | Status: DC

## 2011-02-04 MED ORDER — TRAZODONE HCL 50 MG PO TABS: 50.0000 mg | ORAL_TABLET | Freq: Every day | ORAL | Status: DC

## 2011-02-04 MED ORDER — MELOXICAM 7.5 MG PO TABS: 7.5000 mg | ORAL_TABLET | Freq: Every day | ORAL | Status: DC

## 2011-02-04 MED ORDER — HYDROCHLOROTHIAZIDE 25 MG PO TABS: 25.0000 mg | ORAL_TABLET | Freq: Every day | ORAL | Status: DC

## 2011-02-04 MED ORDER — GABAPENTIN 300 MG PO CAPS: 300.0000 mg | ORAL_CAPSULE | Freq: Three times a day (TID) | ORAL | Status: DC

## 2011-02-04 NOTE — Telephone Encounter (Signed)
Please let this patient know that I have refilled all her meds except the Ambien which I do not have record of her ever being on. I looked in both computer systems and there is not a record of being on Ambien. Besides, I sent in Trazodone for insomnia which she has been on in the past. She can not be on both for sleep.

## 2011-02-05 NOTE — Telephone Encounter (Signed)
Message left on voicemail to return call.

## 2011-02-06 ENCOUNTER — Ambulatory Visit (INDEPENDENT_AMBULATORY_CARE_PROVIDER_SITE_OTHER): Payer: Medicare Other | Admitting: Family Medicine

## 2011-02-06 ENCOUNTER — Encounter: Payer: Self-pay | Admitting: Family Medicine

## 2011-02-06 VITALS — BP 135/81 | HR 69 | Temp 98.2°F | Wt 197.2 lb

## 2011-02-06 DIAGNOSIS — M25569 Pain in unspecified knee: Secondary | ICD-10-CM

## 2011-02-06 DIAGNOSIS — M25561 Pain in right knee: Secondary | ICD-10-CM | POA: Insufficient documentation

## 2011-02-06 DIAGNOSIS — M549 Dorsalgia, unspecified: Secondary | ICD-10-CM | POA: Insufficient documentation

## 2011-02-06 DIAGNOSIS — M79609 Pain in unspecified limb: Secondary | ICD-10-CM

## 2011-02-06 DIAGNOSIS — I10 Essential (primary) hypertension: Secondary | ICD-10-CM

## 2011-02-06 DIAGNOSIS — M79669 Pain in unspecified lower leg: Secondary | ICD-10-CM | POA: Insufficient documentation

## 2011-02-06 LAB — BASIC METABOLIC PANEL
BUN: 22 mg/dL (ref 6–23)
CO2: 26 mEq/L (ref 19–32)
Calcium: 9.6 mg/dL (ref 8.4–10.5)
Chloride: 104 mEq/L (ref 96–112)
Sodium: 139 mEq/L (ref 135–145)

## 2011-02-06 LAB — LIPID PANEL
Cholesterol: 201 mg/dL — ABNORMAL HIGH (ref 0–200)
HDL: 50 mg/dL (ref >39)

## 2011-02-06 NOTE — Telephone Encounter (Signed)
Patient notified of message from MD. 

## 2011-02-06 NOTE — Assessment & Plan Note (Signed)
Pt had surgery 1 month ago. She is now having Rt calf and has not been on any blood thinner or ASA since the surgery. She is concerned for clot. Minimal Rt calf pain with palpation. Will get d-dimer.

## 2011-02-06 NOTE — Patient Instructions (Signed)
We are going to get an xray of your chest to look for aspiration pneumonia, and bilateral standing xrays to look for arthritis in your knees.  Also, we are going to check you blood today for cholesterol, kidney and glucose function and do a d-dimer to evaluate for a clot.  We will call you with results.

## 2011-02-06 NOTE — Assessment & Plan Note (Signed)
Pt has vocal cord problems and is suppose to be on thicken liquids for aspiration risk. She feels like she has been aspirating her foods lately and is concerned about PNA. Plan to do CXR to eval for asp PNA.

## 2011-02-07 NOTE — Assessment & Plan Note (Addendum)
Pt is having some Rt knee pain with very minimal swellling. She has some pain going up and down stairs. I suspect that she is having some pain from OA. Will get standing knee x-rays. PT is also wearing some support hose and elevated legs and says that this feels better. Pt will continue taking Mobic now that she has her refill of it.

## 2011-02-07 NOTE — Progress Notes (Signed)
Knee pain: Pt has been having some knee pain for the last 4-5 days. It has been hurting going up and down stairs. She has sharp pain only when walking, no pain while seated. She has noted some Rt knee swelling. She started having the pain after she ran out of her Mobic. She has not fallen or had any trauma to the knee.   Back pain:  Pt is having some back pain and  Pt has vocal cord problems and is suppose to be on thicken liquids for aspiration risk. She feels like she has been aspirating her foods lately and is concerned about PNA. No fever or chills. No change in urination or pain with urination.   Calf pain:  Pt recently had surgery and was not put on anything for DVT prophylaxis according to her. She was not even taking her usual aspirin. She is concerned that the pain in her calf (which only happens when she walks) could be a DVT. PT has nothad any difficulty breathing.   HTN: Pt has been out of her meds until yesterday. She took her BP pills yesterday and her BP is improved today.   ROS: neg except as noted in HPI.  PE:  Gen: NAD, sitting comfortably on bed CV: RRR, no murmurs Pulm: CTAB, no wheezes or crackles, normal breathing.  Back: Pt has some paraspinal pain at the level of T12.  Knee: Pt has equal range of motion with both hips and flexion and exetnsion is equal bilaterally with the knee, no tenderness with palpation around knee, nor on the joint line, valgus and varus stress does not cause tenderness bilatearlly, no Rt knee swelling noted. No calf sweeling or tenderness on squeezing, no warmth, no erythema.

## 2011-02-09 NOTE — Assessment & Plan Note (Signed)
Pt ran out of all her medications and has not been taking them until yesterday. Her BP is fairly well controlled today. Will not adjust anything at this time. If elevated at the next appointment would consider adding an additional medication.

## 2011-02-12 ENCOUNTER — Other Ambulatory Visit: Payer: Self-pay | Admitting: Family Medicine

## 2011-02-12 ENCOUNTER — Ambulatory Visit
Admission: RE | Admit: 2011-02-12 | Discharge: 2011-02-12 | Disposition: A | Discharge status: Home / Self Care | Payer: Medicare Other | Source: Ambulatory Visit | Attending: Family Medicine | Admitting: Family Medicine

## 2011-02-12 ENCOUNTER — Telehealth: Payer: Self-pay | Admitting: *Deleted

## 2011-02-12 DIAGNOSIS — M549 Dorsalgia, unspecified: Secondary | ICD-10-CM

## 2011-02-12 DIAGNOSIS — M25561 Pain in right knee: Secondary | ICD-10-CM

## 2011-02-12 NOTE — Telephone Encounter (Signed)
LVM for patient to call back to inform of below 

## 2011-02-12 NOTE — Telephone Encounter (Signed)
Message copied by Jimmy Footman on Wed Feb 12, 2011 11:35 AM ------      Message from: Jamie Brookes      Created: Tue Feb 11, 2011  9:21 PM      Regarding: Please call this patient       And ask her if her leg is still hurting? Is she breathing ok?       Her test for a clot was elevated by 0.1above normal and I suspect that this has nothing to do with a clot. But I will consider doing an ultrasound of her legs if she is having continued pain or difficulty breathing. Please let me know.       Amber      ----- Message -----         From: [facility] Interface         Sent: 02/06/2011  11:37 AM           To: Jamie Brookes

## 2011-02-12 NOTE — Telephone Encounter (Signed)
lvm for pt to return call °

## 2011-02-14 NOTE — Telephone Encounter (Signed)
Left message on patient's voicemail informing of message below after attempting to reach patient numerous time. Left phone number for patient to call us back and let us know if there are any issues or at least to let us know she is doing ok.

## 2011-02-20 ENCOUNTER — Ambulatory Visit: Payer: No Typology Code available for payment source | Admitting: Home Health Services

## 2011-02-21 ENCOUNTER — Encounter: Payer: Self-pay | Admitting: Home Health Services

## 2011-02-21 ENCOUNTER — Ambulatory Visit (INDEPENDENT_AMBULATORY_CARE_PROVIDER_SITE_OTHER): Payer: Medicare Other | Admitting: Family Medicine

## 2011-02-21 ENCOUNTER — Ambulatory Visit (INDEPENDENT_AMBULATORY_CARE_PROVIDER_SITE_OTHER): Payer: No Typology Code available for payment source | Admitting: Home Health Services

## 2011-02-21 VITALS — BP 115/80 | HR 71 | Temp 98.2°F | Ht 64.5 in | Wt 192.2 lb

## 2011-02-21 DIAGNOSIS — J13 Pneumonia due to Streptococcus pneumoniae: Secondary | ICD-10-CM

## 2011-02-21 DIAGNOSIS — Z0000 Encounter for general adult medical examination without abnormal findings: Secondary | ICD-10-CM

## 2011-02-21 MED ORDER — PNEUMOCOCCAL VAC POLYVALENT 25 MCG/0.5ML IJ INJ: 0.5000 mL | INJECTION | Freq: Once | INTRAMUSCULAR | Status: DC

## 2011-02-21 NOTE — Progress Notes (Signed)
Patient here for annual wellness visit, patient reports: Risk Factors/Conditions needing evaluation or treatment: Patient does not have any risk factors that need evaluation. Home Safety: Patient lives in 2 story home with granddaughter and 3 great grandchildren.  Patient reports having smoke detectors and does not have adaptive equipment in bathroom. Other Information: Corrective lens: Patient reports wearing corrective lens for driving.  Results from vision screening in hearing/vision.  Patient reports visiting eye doctor every 2 years. Dentures: Patient does not have dentures and reports having a tooth that needs to be pulled.  She does not have dental insurance. Memory: Patient reports some memory loss. Patient's Mini Mental Score (recorded in doc. flowsheet): 30 Patient's (15 question) Geriatric Depression Screening Score (recorded in doc. flowsheet): 7   Balance Abnormal Patient value  Sitting balance    Arise  Uses hands  Attempts to arise    Immediate standing balance    Standing balance    Nudge    Eyes closed    360 degree turn    Sitting down  Uses hands   Gait Abnormal Patient value  Initiation of gait    Step length-left    Step length-right    Step height-left    Step height-right    Step symmetry    Step continuity    Path    Trunk    Walking stance        Annual Wellness Visit Requirements Recorded Today In  Medical, family, social history Past Medical, Family, Social History Section  Current providers Care team  Current medications Medications  Wt, BP, Ht, BMI Vital signs  Visual acuity (welcome visit) Hearing/Vision  Hearing assessment (welcome visit) Hearing/Vision  Tobacco, alcohol, illicit drug use History  ADL Nurse Assessment  Depression Screening Nurse Assessment  Cognitive impairment Nurse Assessment  Mini Mental Status Document Flowsheet  Fall Risk Nurse Assessment  Home Safety Progress Note  End of Life Planning (welcome visit) Social  Documentation  Medicare preventative services Progress Note  Risk factors/conditions needing evaluation/treatment Progress Note  Personalized health advice Patient Instructions, goals, letter  Diet & Exercise Social Documentation  Emergency Contact Social Documentation  Seat Belts Social Documentation  Sun exposure/protection Social Documentation    Prevention Plan: Reccommended patient schedule a colonoscopy, mammogram, and contact pharmacy for shingles vaccine.   Recommended Medicare Prevention Screenings Women over 69 Test For Frequency Date of Last- BOLD if needed  Breast Cancer 1-2 yrs 11/08  Cervical Cancer 1-3 yrs hysterectomy  Colorectal Cancer 1-10 yrs recommended  Osteoporosis once 12/2006  Cholesterol 5 yrs 4/12  Diabetes yearly 11/08  HIV yearly declined  Influenza Shot yearly 9/11  Pneumonia Shot once 4/12  Zostavax Shot once recommended

## 2011-02-21 NOTE — Patient Instructions (Addendum)
1. Work on losing 15 pounds by April 2013. 2. Start walking/exercising 3-4 times a week for 30 minutes. 3. Call Pharmacy for shingles vaccine. 4. Schedule a mammogram. 5. Schedule a colonoscopy.  6. Focus on eating 3-4 (soft) vegetables a day.  7. Review your medical wishes with your family.

## 2011-02-23 NOTE — Progress Notes (Signed)
I saw and examined this patient with Rosalita Chessman.   PE:  Gen: sitting comfortably in chair. HEENt: [place]/AT, EOMI, PERRL, decrease in voice quality, no external ear or nose deformities CV: RRR, no murmur Pulm: CTAB, no wheezes.  Ext: no edema

## 2011-03-11 NOTE — Progress Notes (Signed)
Pt was seen and examined with Andrea Buck.   Pt is doing well. No specific concerns today.   PE:  Gen: Pt sitting comfortably in chair.  CV: RRR, no murmur, rubs or gallops Pulm: CTAB, no wheezing or crackles.  Ext: no edema, warm dry intact skin.

## 2011-03-14 NOTE — H&P (Signed)
   NAME:  Andrea Buck, Andrea Buck                           ACCOUNT NO.:  [id number]   MEDICAL RECORD NO.:  [id number]                   PATIENT TYPE:  AMB   LOCATION:  [facility]                                  FACILITY:  [facility]   PHYSICIAN:  [name], M.D.         DATE OF BIRTH:  1946-01-24   DATE OF ADMISSION:  DATE OF DISCHARGE:                                HISTORY & PHYSICAL   CHIEF COMPLAINT:  The patient is a 64 year old African-American female with  a right-sided Bartholin cyst who presents for a marsupialization.   HISTORY OF PRESENT ILLNESS:  As above.   PAST OBSTETRICAL/GYNECOLOGICAL HISTORY:  She has been pregnant five times.  She has five living children.  She is status post hysterectomy and two  retropubic cystourethropexies.   PAST MEDICAL HISTORY:  1. Bronchitis.  2. Depression.  3. Hypertension.  4. Back problems.   PAST SURGICAL HISTORY:  As above, laminectomy and carpal tunnel.   SOCIAL HISTORY:  She is divorced.  She is employed as an L.P.N.  She denies  any significant smoking history.  She does not give any significant history  of alcohol usage.  She reports a minimal amount of caffeinated beverages and  denies illicit drug use.   FAMILY HISTORY:  Remarkable for breast cancer, Alzheimer disease, CVA,  diabetes mellitus, and hypertension.   REVIEW OF SYSTEMS:  Noncontributory.   MEDICATIONS:  Premarin, Celebrex, Neurontin, Lexapro, hydrochlorothiazide,  albuterol inhaler.   ALLERGIES:  No known drug allergies.   PHYSICAL EXAMINATION:  VITAL SIGNS:  Height 5 feet 4 inches, weight 240  pounds, temperature 97.4, pulse 72, blood pressure 140/80.  GENERAL APPEARANCE:  An African-American female, appears stated age, obese  body habitus.  LUNGS:  Clear to auscultation.  HEART:  Regular rate and rhythm.  ABDOMEN:  Soft, nontender without masses.  PELVIC:  There is a nontender Bartholin gland cyst on the right. Otherwise,  the external female genitalia are  normal appearing.  On speculum exam the  vagina was clean.  There are no lesions.  Bimanual exam:  The adnexa are  nonpalpable.    ASSESSMENT:  Right-sided Bartholin gland cyst.   PLAN:  Marsupialization and biopsy of the cyst wall.                                               [name], M.D.    Judee Clara  D:  09/05/2003  T:  09/05/2003  Job:  [id number]

## 2011-03-14 NOTE — Op Note (Signed)
NAME:  Andrea Buck, Andrea Buck                           ACCOUNT NO.:  [id number]   MEDICAL RECORD NO.:  [id number]                   PATIENT TYPE:  OBV   LOCATION:  9325                                 FACILITY:  [facility]   PHYSICIAN:  [name], M.D.         DATE OF BIRTH:  [birth year]   DATE OF PROCEDURE:  09/26/2003  DATE OF DISCHARGE:                                 OPERATIVE REPORT   PREOPERATIVE DIAGNOSIS:  Right-sided Bartholin's gland cyst.   POSTOPERATIVE DIAGNOSIS:  Right-sided Bartholin's gland cyst.   PROCEDURE:  Marsupialization of a right Bartholin's gland cyst.   SURGEON:  [name], M.D.   ANESTHESIA:  Laryngeal mask airway.   COMPLICATIONS:  None.   ESTIMATED BLOOD LOSS:  50 mL.   FINDINGS:  The right Bartholin's gland was dilated approximately 3 cm and  was fluctuant.  Upon drainage of the cyst, cloudy fluid was noted.  The cyst  wall appeared smooth.   The patient was taken to the operating room.  She was placed in the dorsal  lithotomy position and prepped and draped in the usual sterile fashion.  The  area over the mucocutaneous junction was infiltrated with 1% lidocaine.  An  elliptical incision was made over the gland at the mucocutaneous junction in  the skin.  The skin was excised.  The gland was then incised with the above  findings noted.  A biopsy was then taken of the gland wall.  The gland wall  was then sutured to the vaginal mucosa with interrupted sutures of 0  Monocryl.  At the biopsy site at approximately 10 o'clock, there was a  bleeder noted.  This was oversewn with figure-of-eight sutures of the same.  Adequate hemostasis was noted.  The gland was then irrigated and packed with  Iodoform gauze.  The patient tolerated the procedure well.  The patient was  taken to the PACU in stable condition.   PATHOLOGY:  Bartholin's gland wall.                                               [name], M.D.    Judee Clara  D:   09/27/2003  T:  09/27/2003  Job:  [id number]

## 2011-03-14 NOTE — Op Note (Signed)
NAME:  Andrea Buck, Andrea Buck                           ACCOUNT NO.:  [id number]   MEDICAL RECORD NO.:  [id number]                   PATIENT TYPE:  OBV   LOCATION:  9325                                 FACILITY:  [facility]   PHYSICIAN:  [name], M.D.         DATE OF BIRTH:  [birth year]   DATE OF PROCEDURE:  09/26/2003  DATE OF DISCHARGE:                                 OPERATIVE REPORT   PREOPERATIVE DIAGNOSIS:  Bleeding at operative site status post right-sided  Bartholin's gland marsupialization and biopsy.   POSTOPERATIVE DIAGNOSIS:  Bleeding at operative site status post right-sided  Bartholin's gland marsupialization and biopsy.   PROCEDURE:  Placement of hemostatic sutures and vaginal packing.   SURGEON:  [name], M.D.   ANESTHESIA:  Laryngeal mask airway.   COMPLICATIONS:  Inadequately controlled bleeding with hemostatic sutures.   ESTIMATED BLOOD LOSS:  50 mL.   FINDINGS:  At the biopsy site, there was noted to be active bleeding.   DESCRIPTION OF PROCEDURE:  The patient was taken to the operating room.  The  patient was placed in the dorsal lithotomy position and prepped and draped  in usual sterile fashion.  The entire right side of the suture line was  oversewn in a running interlocking fashion with 2-0 chromic.  Interrupted  sutures of the same were placed to obtain adequate hemostasis.  The gland  was then irrigated and watched for several minutes and there was no active  bleeding noted.  The gland was then again packed with Iodoform gauze  packing.  However, after the patient was transferred to the stretcher en  route to the PACU, brisk bleeding was again noted.  At this point, the  vagina was packed tightly with Kerlix.  A Foley catheter was placed.  The  patient was then taken to the PACU in stable condition.                                               [name], M.D.    Andrea Buck  D:  09/27/2003  T:  09/27/2003  Job:  [id number]

## 2011-03-14 NOTE — Op Note (Signed)
Andrea Buck, Andrea Buck                 ACCOUNT NO.:  [id number]   MEDICAL RECORD NO.:  [id number]          PATIENT TYPE:  AMB   LOCATION:  ENDO                         FACILITY:  [facility]   PHYSICIAN:  John C. Madilyn Fireman, M.D.    DATE OF BIRTH:  [birth year]   DATE OF PROCEDURE:  10/30/2004  DATE OF DISCHARGE:                                 OPERATIVE REPORT   PROCEDURE:  Colonoscopy.   INDICATIONS FOR PROCEDURE:  Average risk colon cancer screening.   PROCEDURE:  The patient was placed in the left lateral decubitus position  and placed on the pulse monitor with continuous low-flow oxygen delivered by  nasal cannula.  She was sedated with 25 mcg IV fentanyl 2 mg IV Versed in  addition to the medicine given for the previous EGD.  The Olympus video  colonoscope was inserted into the rectum and advanced the cecum, confirmed  by transillumination of McBurney's point and visualization of ileocecal  valve and appendiceal orifice.  The prep was excellent.  The cecum,  ascending, transverse, descending and sigmoid colon all appeared normal with  no masses, polyps, diverticula or other mucosal abnormalities.  The rectum  likewise appeared normal and retroflexed view of the anus revealed no  obvious internal hemorrhoids.  The scope was then withdrawn and the patient  returned to the recovery room in stable condition .  She tolerated the  procedure well and there were no immediate complications.   IMPRESSION:  Normal colonoscopy.   PLAN:  Next colon screening by sigmoidoscopy in five years.      J[facility]/MEDQ  D:  10/30/2004  T:  10/30/2004  Job:  [id number]   cc:   Rodolph Bong, M.D.  [address] [place], [place] [address]  Fax: [phone]

## 2011-03-14 NOTE — Op Note (Signed)
Andrea Buck, Andrea Buck                 ACCOUNT NO.:  [id number]   MEDICAL RECORD NO.:  [id number]          PATIENT TYPE:  AMB   LOCATION:  ENDO                         FACILITY:  [facility]   PHYSICIAN:  John C. Madilyn Fireman, M.D.    DATE OF BIRTH:  05/23/1946   DATE OF PROCEDURE:  10/30/2004  DATE OF DISCHARGE:                                 OPERATIVE REPORT   PROCEDURE:  Esophagogastroduodenoscopy.   INDICATIONS FOR PROCEDURE:  Epigastric abdominal pain as well as solid and  liquid dysphagia with failure to respond adequately to proton pump inhibitor  and recent negative gallbladder ultrasound.   DESCRIPTION OF PROCEDURE:  The patient was placed in the left lateral  decubitus position and placed on the pulse monitor with continuous low flow  oxygen delivered by nasal cannula.  She was sedated with 75 mcg IV Fentanyl  and 8 mg IV Versed.  The Olympus videoendoscope was advanced under direct  vision into the oropharynx and esophagus.  The esophagus was straight and of  normal caliber, with the squamocolumnar line at 38 cm.  There was a fairly  patent but distinctive lower esophageal ring above a 1.5-cm hiatal hernia.  There was no visible esophagitis or significant length of stricture.  The  stomach was entered, and small amount of liquid secretions were suctioned  from the fundus.  Retroflex view of the cardia confirmed a hiatal hernia but  was otherwise unremarkable.  The fundus, body, antrum, and pylorus were well-  inspected, and there appeared to be some granularity mainly in the fundus  and some mild erythema with finer granularity in the antrum consistent with  a diffuse mild gastritis.  No ulcers or erosions were seen.  The pylorus was  nondeformed and easily allowed passage of the endoscope dip into the  duodenum.  Both the bulb and second portion were well-inspected and appeared  to be within normal limits.  The scope was withdrawn back into the stomach  and a CLOtest obtained.  The  Savary guidewire was then passed into the distal duodenum and the scope  withdrawn.  A single 17-mm Savary dilator was passed over the guidewire with  minimal resistance and minimal blood seen on withdrawal.  The dilator was  removed together with the wire and the patient returned to the recovery room  in stable condition.  She tolerated the procedure well, and there were no  immediate complications.   IMPRESSION:  1.  Hiatal hernia with lower esophageal ring.  2.  Mild gastritis.   PLAN:  Await CLOtest and treat for eradication if Helicobacter is positive.  Otherwise, continue proton pump inhibitor and advance diet and observe  response to today's dilatation.      JCH/MEDQ  D:  10/30/2004  T:  10/30/2004  Job:  [id number]   cc:   Penni Bombard, M.D.

## 2011-05-21 ENCOUNTER — Ambulatory Visit (INDEPENDENT_AMBULATORY_CARE_PROVIDER_SITE_OTHER): Payer: Medicare Other | Admitting: Family Medicine

## 2011-05-21 ENCOUNTER — Encounter: Payer: Self-pay | Admitting: Family Medicine

## 2011-05-21 VITALS — BP 148/88 | HR 69 | Temp 98.2°F | Ht 64.5 in | Wt 194.1 lb

## 2011-05-21 DIAGNOSIS — M25519 Pain in unspecified shoulder: Secondary | ICD-10-CM

## 2011-05-21 DIAGNOSIS — J3800 Paralysis of vocal cords and larynx, unspecified: Secondary | ICD-10-CM

## 2011-05-21 MED ORDER — CYCLOBENZAPRINE HCL 10 MG PO TABS: 10.0000 mg | ORAL_TABLET | Freq: Three times a day (TID) | ORAL | Status: DC | PRN

## 2011-05-21 NOTE — Assessment & Plan Note (Signed)
Rt. Sided present for 3-4 days

## 2011-05-26 ENCOUNTER — Encounter: Payer: Self-pay | Admitting: Family Medicine

## 2011-05-26 DIAGNOSIS — J3800 Paralysis of vocal cords and larynx, unspecified: Secondary | ICD-10-CM

## 2011-05-26 HISTORY — DX: Paralysis of vocal cords and larynx, unspecified: J38.00

## 2011-05-26 NOTE — Assessment & Plan Note (Signed)
Wants ENT referral for second opinion.

## 2011-05-26 NOTE — Progress Notes (Signed)
  Subjective:    Patient ID: Andrea Buck, female    DOB: Jul 30, 1946, 65 y.o.   MRN: [id number]  HPI  Two distinct issues: Rt shoulder pain, worse with deep breath.  Has been doing some extra activity.  Also worse with movement. Had Rt shoulder pain with gallbladder disease but is SP cholecystectomy.  No risk factors for PE.  Specifically, denies SOB, recent travel, previous clotting problems, recent surg or BCPs.  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  She ended with unilateral vocal cord paralysis that has not responded to vocal cord injections.  She is dissatisfied with the current state of her symptoms and would like a second opinion.  Her troubles are intermitant aspiration and she works with speech therapy for this.  Also, has acute pain which sounds neuropathic.    Review of Systems     Objective:   Physical Exam Some decrease ROM of Rt shoulder above her head.  No obvious crepitis Lungs clear. Pain is located beneath Rt Scapula       Assessment & Plan:   Subscapular pain Rt. Sided present for 3-4 days

## 2011-05-27 ENCOUNTER — Telehealth: Payer: Self-pay | Admitting: *Deleted

## 2011-05-27 NOTE — Telephone Encounter (Signed)
Pt called back and info was given to her.

## 2011-05-27 NOTE — Telephone Encounter (Signed)
LVM for patient to call back to inform of ENT referral appointment set up. [facility] ENT 8/2 @ 3:40pm with Dr. Emeline Darling. If patient needs to cancel or reschedule call [phone].Erma Pinto

## 2011-07-16 ENCOUNTER — Other Ambulatory Visit: Payer: Self-pay | Admitting: Family Medicine

## 2011-07-16 DIAGNOSIS — Z1231 Encounter for screening mammogram for malignant neoplasm of breast: Secondary | ICD-10-CM

## 2011-07-23 ENCOUNTER — Ambulatory Visit (HOSPITAL_COMMUNITY)
Admission: RE | Admit: 2011-07-23 | Discharge: 2011-07-23 | Disposition: A | Discharge status: Home / Self Care | Payer: Medicare Other | Source: Ambulatory Visit | Attending: Family Medicine | Admitting: Family Medicine

## 2011-07-23 DIAGNOSIS — Z1231 Encounter for screening mammogram for malignant neoplasm of breast: Secondary | ICD-10-CM | POA: Insufficient documentation

## 2011-08-12 LAB — URINALYSIS, ROUTINE W REFLEX MICROSCOPIC
Glucose, UA: NEGATIVE
Hgb urine dipstick: NEGATIVE
Ketones, ur: 40 — AB
Protein, ur: NEGATIVE
pH: 7

## 2011-08-12 LAB — COMPREHENSIVE METABOLIC PANEL
AST: 18
BUN: 19
CO2: 26
Chloride: 103
Creatinine, Ser: 0.98
GFR calc non Af Amer: 58 — ABNORMAL LOW
Glucose, Bld: 145 — ABNORMAL HIGH
Total Bilirubin: 0.9

## 2011-08-12 LAB — LIPASE, BLOOD: Lipase: 25

## 2011-08-12 LAB — CBC
HCT: 36.8
Hemoglobin: 12.6
MCHC: 34.2
MCV: 89.7
RBC: 4.1
WBC: 4

## 2011-08-12 LAB — DIFFERENTIAL
Basophils Absolute: 0
Eosinophils Relative: 0
Lymphocytes Relative: 33
Neutro Abs: 2.3
Neutrophils Relative %: 57

## 2011-08-12 LAB — URINE MICROSCOPIC-ADD ON

## 2011-08-12 LAB — URINE CULTURE: Colony Count: 80000

## 2011-11-12 DIAGNOSIS — J3800 Paralysis of vocal cords and larynx, unspecified: Secondary | ICD-10-CM | POA: Diagnosis not present

## 2011-11-26 ENCOUNTER — Ambulatory Visit (INDEPENDENT_AMBULATORY_CARE_PROVIDER_SITE_OTHER): Payer: Medicare Other | Admitting: Family Medicine

## 2011-11-26 ENCOUNTER — Encounter: Payer: Self-pay | Admitting: Family Medicine

## 2011-11-26 VITALS — BP 146/88 | HR 93 | Temp 99.3°F | Ht 65.0 in | Wt 201.0 lb

## 2011-11-26 DIAGNOSIS — H6690 Otitis media, unspecified, unspecified ear: Secondary | ICD-10-CM | POA: Diagnosis not present

## 2011-11-26 MED ORDER — AMOXICILLIN 875 MG PO TABS: 875.0000 mg | ORAL_TABLET | Freq: Two times a day (BID) | ORAL | Status: AC

## 2011-11-26 MED ORDER — GUAIFENESIN-CODEINE 100-10 MG/5ML PO SYRP: 5.0000 mL | ORAL_SOLUTION | Freq: Four times a day (QID) | ORAL | Status: DC | PRN

## 2011-11-26 NOTE — Progress Notes (Signed)
  Subjective:    Patient ID: Andrea Buck, female    DOB: Oct 18, 1946, 66 y.o.   MRN: [id number]  HPI work in appt  Here for 5 days of worsening cough with sputum, nasal congestion, chills, bilateral ear pain.  Some dyspnea.  Compliant with asthma meds. Notes coughing with some post-tussive emesis.   Is planning on surgery in a little more than a week for ?vocal cords?    I have reviewed patient's  PMH, FH, and Social history and Medications as related to this visit. Patient has never smoked.  Hx sig for asthma Review of SystemsGeneral:  Positive for fever, chills No malaise, myalgias HEENT: Positive for conjunctivitis, ear pain or drainage, rhinorrhea, nasal congestion, sore throat Respiratory:  Positive for cough, sputum, dyspnea Abdomen: Negative for abdominal pain, emesis, diarrhea.           Objective:   Physical Exam GEN: Alert & Oriented, No acute distress, tired appearing HEENT: [place]/AT. EOMI, PERRLA, no conjunctival injection or scleral icterus.  Bilateral tympanic membranes intact very erythematous bilaterally, without effusion.   Nares with edema/rhinorrhea. Upper dentures in place.  Oropharynx is without erythema or exudates.  No anterior or posterior cervical lymphadenopathy. CV:  Regular Rate & Rhythm, no murmur Respiratory:  Normal work of breathing, CTAB, no wheezes        Assessment & Plan:

## 2011-11-26 NOTE — Patient Instructions (Signed)
Take antibiotic (amoxicillin) for ear infection  Make follow-up for physical with your primary doctor

## 2011-11-26 NOTE — Assessment & Plan Note (Signed)
Glenford Peers with otitis media.  Will tx with amoxicillin x 7 days.

## 2011-12-01 DIAGNOSIS — I10 Essential (primary) hypertension: Secondary | ICD-10-CM | POA: Diagnosis not present

## 2011-12-01 DIAGNOSIS — R079 Chest pain, unspecified: Secondary | ICD-10-CM | POA: Diagnosis not present

## 2011-12-01 DIAGNOSIS — K219 Gastro-esophageal reflux disease without esophagitis: Secondary | ICD-10-CM | POA: Diagnosis not present

## 2011-12-01 DIAGNOSIS — J3801 Paralysis of vocal cords and larynx, unilateral: Secondary | ICD-10-CM | POA: Diagnosis not present

## 2011-12-01 DIAGNOSIS — Z23 Encounter for immunization: Secondary | ICD-10-CM | POA: Diagnosis not present

## 2011-12-01 DIAGNOSIS — Z01811 Encounter for preprocedural respiratory examination: Secondary | ICD-10-CM | POA: Diagnosis not present

## 2011-12-04 DIAGNOSIS — K219 Gastro-esophageal reflux disease without esophagitis: Secondary | ICD-10-CM | POA: Diagnosis not present

## 2011-12-04 DIAGNOSIS — I10 Essential (primary) hypertension: Secondary | ICD-10-CM | POA: Diagnosis not present

## 2011-12-04 DIAGNOSIS — J3801 Paralysis of vocal cords and larynx, unilateral: Secondary | ICD-10-CM | POA: Diagnosis not present

## 2011-12-04 DIAGNOSIS — Z23 Encounter for immunization: Secondary | ICD-10-CM | POA: Diagnosis not present

## 2011-12-04 DIAGNOSIS — J3800 Paralysis of vocal cords and larynx, unspecified: Secondary | ICD-10-CM | POA: Diagnosis not present

## 2011-12-05 DIAGNOSIS — J3801 Paralysis of vocal cords and larynx, unilateral: Secondary | ICD-10-CM | POA: Diagnosis not present

## 2011-12-05 DIAGNOSIS — I10 Essential (primary) hypertension: Secondary | ICD-10-CM | POA: Diagnosis not present

## 2011-12-05 DIAGNOSIS — K219 Gastro-esophageal reflux disease without esophagitis: Secondary | ICD-10-CM | POA: Diagnosis not present

## 2011-12-05 DIAGNOSIS — Z23 Encounter for immunization: Secondary | ICD-10-CM | POA: Diagnosis not present

## 2011-12-31 DIAGNOSIS — J3800 Paralysis of vocal cords and larynx, unspecified: Secondary | ICD-10-CM | POA: Diagnosis not present

## 2012-01-13 ENCOUNTER — Other Ambulatory Visit: Payer: Self-pay | Admitting: Family Medicine

## 2012-01-13 MED ORDER — MELOXICAM 7.5 MG PO TABS: 7.5000 mg | ORAL_TABLET | Freq: Every day | ORAL | Status: DC

## 2012-01-13 MED ORDER — SERTRALINE HCL 100 MG PO TABS: 100.0000 mg | ORAL_TABLET | Freq: Every day | ORAL | Status: DC

## 2012-01-13 MED ORDER — LISINOPRIL 10 MG PO TABS: 10.0000 mg | ORAL_TABLET | Freq: Every day | ORAL | Status: DC

## 2012-01-19 ENCOUNTER — Other Ambulatory Visit: Payer: Self-pay | Admitting: Family Medicine

## 2012-01-19 MED ORDER — SERTRALINE HCL 100 MG PO TABS: 100.0000 mg | ORAL_TABLET | Freq: Every day | ORAL | Status: DC

## 2012-01-22 ENCOUNTER — Other Ambulatory Visit: Payer: Self-pay | Admitting: Family Medicine

## 2012-01-22 MED ORDER — LISINOPRIL 10 MG PO TABS: 10.0000 mg | ORAL_TABLET | Freq: Every day | ORAL | Status: DC

## 2012-01-22 MED ORDER — MELOXICAM 7.5 MG PO TABS: 7.5000 mg | ORAL_TABLET | Freq: Every day | ORAL | Status: DC

## 2012-01-22 MED ORDER — SERTRALINE HCL 100 MG PO TABS: 100.0000 mg | ORAL_TABLET | Freq: Every day | ORAL | Status: DC

## 2012-02-02 ENCOUNTER — Other Ambulatory Visit: Payer: Self-pay | Admitting: Family Medicine

## 2012-02-02 MED ORDER — LISINOPRIL 10 MG PO TABS: 10.0000 mg | ORAL_TABLET | Freq: Every day | ORAL | Status: DC

## 2012-02-12 DIAGNOSIS — H2510 Age-related nuclear cataract, unspecified eye: Secondary | ICD-10-CM | POA: Diagnosis not present

## 2012-02-12 DIAGNOSIS — H524 Presbyopia: Secondary | ICD-10-CM | POA: Diagnosis not present

## 2012-02-12 DIAGNOSIS — H5210 Myopia, unspecified eye: Secondary | ICD-10-CM | POA: Diagnosis not present

## 2012-02-25 ENCOUNTER — Ambulatory Visit (INDEPENDENT_AMBULATORY_CARE_PROVIDER_SITE_OTHER): Payer: Medicare Other | Admitting: Family Medicine

## 2012-02-25 ENCOUNTER — Encounter: Payer: Self-pay | Admitting: Family Medicine

## 2012-02-25 VITALS — BP 128/82 | HR 66 | Temp 97.6°F | Ht 65.0 in | Wt 205.6 lb

## 2012-02-25 DIAGNOSIS — M25519 Pain in unspecified shoulder: Secondary | ICD-10-CM | POA: Diagnosis not present

## 2012-02-25 DIAGNOSIS — F339 Major depressive disorder, recurrent, unspecified: Secondary | ICD-10-CM

## 2012-02-25 DIAGNOSIS — F411 Generalized anxiety disorder: Secondary | ICD-10-CM

## 2012-02-25 DIAGNOSIS — Z1211 Encounter for screening for malignant neoplasm of colon: Secondary | ICD-10-CM | POA: Diagnosis not present

## 2012-02-25 MED ORDER — ZOSTER VACCINE LIVE 19400 UNT/0.65ML ~~LOC~~ SOLR: 0.6500 mL | Freq: Once | SUBCUTANEOUS | Status: AC

## 2012-02-25 MED ORDER — OMEPRAZOLE 20 MG PO CPDR: 20.0000 mg | DELAYED_RELEASE_CAPSULE | Freq: Two times a day (BID) | ORAL | Status: DC

## 2012-02-25 MED ORDER — CYCLOBENZAPRINE HCL 10 MG PO TABS: 10.0000 mg | ORAL_TABLET | Freq: Three times a day (TID) | ORAL | Status: AC | PRN

## 2012-02-25 MED ORDER — SERTRALINE HCL 100 MG PO TABS: 100.0000 mg | ORAL_TABLET | Freq: Every day | ORAL | Status: DC

## 2012-02-25 MED ORDER — ALBUTEROL SULFATE HFA 108 (90 BASE) MCG/ACT IN AERS: 2.0000 | INHALATION_SPRAY | Freq: Four times a day (QID) | RESPIRATORY_TRACT | Status: DC | PRN

## 2012-02-25 MED ORDER — FLUTICASONE-SALMETEROL 250-50 MCG/DOSE IN AEPB: 1.0000 | INHALATION_SPRAY | Freq: Two times a day (BID) | RESPIRATORY_TRACT | Status: DC

## 2012-02-25 MED ORDER — TRAZODONE HCL 50 MG PO TABS: 50.0000 mg | ORAL_TABLET | Freq: Every day | ORAL | Status: DC

## 2012-02-25 MED ORDER — MOMETASONE FUROATE 50 MCG/ACT NA SUSP: 1.0000 | Freq: Every day | NASAL | Status: DC

## 2012-02-25 MED ORDER — ASPIRIN 81 MG PO TBEC: 81.0000 mg | DELAYED_RELEASE_TABLET | Freq: Every day | ORAL | Status: AC

## 2012-02-25 MED ORDER — BUSPIRONE HCL 15 MG PO TABS: 15.0000 mg | ORAL_TABLET | Freq: Three times a day (TID) | ORAL | Status: AC

## 2012-02-25 MED ORDER — CALCIUM CARBONATE-VITAMIN D 500-200 MG-UNIT PO TABS: 1.0000 | ORAL_TABLET | Freq: Two times a day (BID) | ORAL | Status: DC

## 2012-02-25 MED ORDER — GABAPENTIN 300 MG PO CAPS: 300.0000 mg | ORAL_CAPSULE | Freq: Three times a day (TID) | ORAL | Status: DC

## 2012-02-25 MED ORDER — MELOXICAM 7.5 MG PO TABS: 7.5000 mg | ORAL_TABLET | Freq: Every day | ORAL | Status: DC

## 2012-02-25 MED ORDER — LISINOPRIL 10 MG PO TABS: 10.0000 mg | ORAL_TABLET | Freq: Every day | ORAL | Status: DC

## 2012-02-25 MED ORDER — HYDROCHLOROTHIAZIDE 25 MG PO TABS: 25.0000 mg | ORAL_TABLET | Freq: Every day | ORAL | Status: DC

## 2012-02-25 NOTE — Progress Notes (Signed)
Procedure Note: I+D of abscess in mouth  Informed Consent Obtained for above procedure. All Risks, benefits, alternative, complications reviewed with patient and understood. Time Out performed. Using 1% lidocaine (1 cc) local anesthesia was obtained at the left of the lower frenulum posterior to the lower lip.  Using an 11 blade a 0.25 cm incision was made and all contents expressed. Blood and minimal pus drained.  The patient tolerated the procedure well. No complications occurred. Patient counseled on post-operative care.  Edd Arbour, MD 2:24 PM 02/25/2012

## 2012-02-25 NOTE — Progress Notes (Signed)
  Subjective:   Patient ID: Andrea Buck, female DOB: 04/21/1946 66 y.o. MRN: [id number] HPI: Here for CPE C/o - stress related to family dynamics, anxiety  1. Anxiety  Onset: has been chronic  Time period of: year(s).  Severity is described as mild-moderate.  Course of her symptoms over time is recurrent, and recently flared up due to family stress issues. Elder grand-children in 30's living at her house.  Associated sx/sn: no suicidality or homicidality.   Tobacco use: Patient is a non smoker.   Review of Systems Pertinent items are noted in HPI.    Objective:   Physical Exam Filed Vitals:   02/25/12 1121 02/25/12 1210  BP: 145/90 128/82  Pulse: 66   Temp: 97.6 F (36.4 C)   TempSrc: Oral   Height: 5\' 5"  (1.651 m)   Weight: 205 lb 9.6 oz (93.26 kg)    General: aaf, nad, pleasant, obese Lungs:  Normal respiratory effort, chest expands symmetrically. Lungs are clear to auscultation, no crackles or wheezes. Heart - Regular rate and rhythm.  No murmurs, gallops or rubs. Abdomen: soft and non-tender without masses, organomegaly or hernias noted.  No guarding or rebound Skin:  Intact without suspicious lesions or rashes Breasts: breasts appear normal, no suspicious masses, no skin or nipple changes or axillary nodes Neck:  No deformities, thyromegaly, masses, or tenderness noted.   Supple with full range of motion without pain. Mouth - mucous membranes are moist, one decaying tooth and dentures. Small abscess along mucous membrane of lower jaw.  Ears:  External ear exam shows no significant lesions or deformities.  Otoscopic examination reveals clear canals, tympanic membranes are intact bilaterally without bulging, retraction, inflammation or discharge. Hearing is grossly normal bilaterall Eye - Pupils Equal Round Reactive to light, Extraocular movements intact, Fundi without hemorrhage or visible lesions, Conjunctiva without redness or discharge      Assessment & Plan:

## 2012-02-25 NOTE — Patient Instructions (Signed)
It was great to see you today!  Schedule an appointment to see me as needed.  

## 2012-02-26 ENCOUNTER — Encounter: Payer: Self-pay | Admitting: Home Health Services

## 2012-03-10 ENCOUNTER — Encounter: Payer: Self-pay | Admitting: Family Medicine

## 2012-03-10 ENCOUNTER — Ambulatory Visit (INDEPENDENT_AMBULATORY_CARE_PROVIDER_SITE_OTHER): Payer: Medicare Other | Admitting: Family Medicine

## 2012-03-10 VITALS — BP 118/72 | HR 80 | Temp 98.9°F | Ht 65.0 in | Wt 203.0 lb

## 2012-03-10 DIAGNOSIS — F411 Generalized anxiety disorder: Secondary | ICD-10-CM | POA: Diagnosis not present

## 2012-03-10 NOTE — Assessment & Plan Note (Signed)
Patient doing well since we started buspirone 15 mg TID.  GAD-7 looks great today.  I will maintain the current dosage.  Only side effect: makes her feel "stupid" at times.

## 2012-03-10 NOTE — Progress Notes (Signed)
  Subjective:   Patient ID: Andrea Buck, female DOB: [birth year] 66 y.o. MRN: [id number] HPI:  1. Anxiety  Synopsis: patient has situational anxiety and GAD. She was started on Buspirone 15 mg TID last visit.  Onset: has been chronic  Time period of: year(s).  Severity is described as mild-moderate.  Course of her symptoms over time is recurrent, and recently flared up due to family stress issues. Elder grand-children in 30's living at her house.  Associated sx/sn: no suicidality or homicidality.   History  Substance Use Topics  . Smoking status: Never Smoker   . Smokeless tobacco: Never Used  . Alcohol Use: No    Review of Systems: Pertinent items are noted in HPI.  Labs Reviewed: none Reviewed Chart Review for last notes.     Objective:   Filed Vitals:   03/10/12 1120  BP: 118/72  Pulse: 80  Temp: 98.9 F (37.2 C)  TempSrc: Oral  Height: 5\' 5"  (1.651 m)  Weight: 203 lb (92.08 kg)   Physical Exam: General: aaf, nad, pleasant Psych:  Cognition and judgment appear intact. Alert, communicative  and cooperative with normal attention span and concentration. No apparent delusions, illusions, hallucinations. Denies suicidal and homicidal thoughts.   Assessment & Plan:

## 2012-03-10 NOTE — Patient Instructions (Signed)
It was great to see you today!  Schedule an appointment to see me as needed.  We will maintain this current dosage of Buspirone.  I recommend you see a dentist soon for your tooth.

## 2012-03-17 ENCOUNTER — Ambulatory Visit: Payer: Medicare Other | Admitting: Home Health Services

## 2012-04-15 NOTE — Addendum Note (Signed)
Addended by: Edd Arbour on: 04/15/2012 11:11 AM   Modules accepted: Level of Service

## 2012-05-14 ENCOUNTER — Ambulatory Visit (INDEPENDENT_AMBULATORY_CARE_PROVIDER_SITE_OTHER): Payer: Medicare Other | Admitting: Family Medicine

## 2012-05-14 ENCOUNTER — Encounter: Payer: Self-pay | Admitting: Family Medicine

## 2012-05-14 VITALS — BP 156/88 | HR 69 | Temp 97.9°F | Ht 65.0 in | Wt 203.6 lb

## 2012-05-14 DIAGNOSIS — IMO0001 Reserved for inherently not codable concepts without codable children: Secondary | ICD-10-CM | POA: Diagnosis not present

## 2012-05-14 DIAGNOSIS — M791 Myalgia, unspecified site: Secondary | ICD-10-CM

## 2012-05-15 DIAGNOSIS — M791 Myalgia, unspecified site: Secondary | ICD-10-CM | POA: Insufficient documentation

## 2012-05-15 NOTE — Progress Notes (Signed)
  Subjective:    Patient ID: Andrea Buck, female    DOB: [birth year], 66 y.o.   MRN: [id number]  HPI Pt that comes today complaining of right arm/back pain. She started having this symptoms 3 weeks ago. 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 Denies clicking, popping or other sounds of right shoulder.  The pain is described as 5-6/10 sharp that starts in the back at the level of scapula and can be reproduced with certain positions or palpation. The pain does not irradiate to de arm. Denies weakness, tingling or numbness of that upper extremity. No edema or erythema of the area or shoulder joint. Pain is worse by palpation and she has taken Mobic PRN with minimal relieve.    Review of Systems Per HPI    Objective:   Physical Exam  Constitutional: She appears distressed.       Mildly, due to pain  Neck: Normal range of motion. Neck supple. No JVD present. No thyromegaly present.  Cardiovascular: Normal rate and regular rhythm.   Pulmonary/Chest: Effort normal and breath sounds normal.  Musculoskeletal: Normal range of motion. She exhibits no edema.       Point of maximum tenderness on right side of back at the level of rhomboid-trapezius muscles. No tenderness on vertebral bodies or scapula. Shoulder ROM intact. Maneuvers to explore rotator cuff injury are negative. No edema, erythema or increase in temperature. No masses palpated.  Normal peripheral pulses.     Assessment & Plan:

## 2012-05-15 NOTE — Assessment & Plan Note (Addendum)
On right mid subscapular area corresponding with Rhomboid and trapezius muscle. No pain on vertebral bodies, no edema, erythema or signs of cellulitis. Plan: Deep massage. Stretching exercises. Continue Mobic and f/u in 2 weeks. If no improvement consider physical therapy, next step will be steroid injection.

## 2012-05-15 NOTE — Patient Instructions (Signed)
Trigger Point Therapy Your exam shows your pain is coming from one or more trigger points. These points are places where the muscles of the neck, shoulder, back, and legs are tender to touch and cause pain, spasm, and fatigue. This condition can cause headaches, painful neck spasms, and low back pain. Trigger points can be thought of as weak areas of muscle that spasm and form hard knots when under chronic strain or stress. Treatment of trigger points may include medicines to reduce pain and inflammation and relax the muscles. Physical therapy can include several techniques. Deep friction massage over the tender areas can often help relieve pain and stiffness. Relaxation exercises and stretching may be helpful; cervical and lumbar traction can be used in severe cases. Cold therapy and hot packs have both proven helpful. Injection therapy with saline, anesthetics, or cortisone medicines, has also been very successful at reducing symptoms. Two or more injections are often needed over several weeks to gain the greatest benefit. Call your doctor to arrange for further treatment as recommended. Document Released: 11/20/2004 Document Revised: 10/02/2011 Document Reviewed: 10/13/2005 [facility] Patient Information 2012 [place], [place].  Please make a follow up appointment in 2 weeks or sooner if pain increases or new symptoms develop.

## 2012-05-26 ENCOUNTER — Ambulatory Visit (INDEPENDENT_AMBULATORY_CARE_PROVIDER_SITE_OTHER): Payer: Medicare Other | Admitting: Family Medicine

## 2012-05-26 ENCOUNTER — Encounter: Payer: Self-pay | Admitting: Family Medicine

## 2012-05-26 VITALS — BP 132/90 | HR 77 | Temp 97.3°F | Ht 65.0 in | Wt 203.3 lb

## 2012-05-26 DIAGNOSIS — M25539 Pain in unspecified wrist: Secondary | ICD-10-CM

## 2012-05-26 DIAGNOSIS — M79609 Pain in unspecified limb: Secondary | ICD-10-CM | POA: Diagnosis not present

## 2012-05-26 DIAGNOSIS — M25531 Pain in right wrist: Secondary | ICD-10-CM

## 2012-05-26 DIAGNOSIS — G8929 Other chronic pain: Secondary | ICD-10-CM | POA: Insufficient documentation

## 2012-05-26 DIAGNOSIS — M79601 Pain in right arm: Secondary | ICD-10-CM

## 2012-05-26 MED ORDER — PREDNISONE 50 MG PO TABS: 50.0000 mg | ORAL_TABLET | Freq: Every day | ORAL | Status: DC

## 2012-05-26 NOTE — Patient Instructions (Addendum)
It was nice to meet you today. We will call you with time and date of physical therapy. Please take Prednisone 50 mg daily x 5 days. Return to clinic 1-2 months or sooner as needed.  Wrist Pain Wrist injuries are frequent in adults and children. A sprain is an injury to the ligaments that hold your bones together. A strain is an injury to muscle or muscle cord-like structures (tendons) from stretching or pulling. Generally, when wrists are moderately tender to touch following a fall or injury, a break in the bone (fracture) may be present. Most wrist sprains or strains are better in 3 to 5 days, but complete healing may take several weeks. HOME CARE INSTRUCTIONS   Put ice on the injured area.   Put ice in a plastic bag.   Place a towel between your skin and the bag.   Leave the ice on for 15 to 20 minutes, 3 to 4 times a day, for the first 2 days.   Keep your arm raised above the level of your heart whenever possible to reduce swelling and pain.   Rest the injured area for at least 48 hours or as directed by your caregiver.   If a splint or elastic bandage has been applied, use it for as long as directed by your caregiver or until seen by a caregiver for a follow-up exam.   Only take over-the-counter or prescription medicines for pain, discomfort, or fever as directed by your caregiver.   Keep all follow-up appointments. You may need to follow up with a specialist or have follow-up X-rays. Improvement in pain level is not a guarantee that you did not fracture a bone in your wrist. The only way to determine whether or not you have a broken bone is by X-ray.  SEEK IMMEDIATE MEDICAL CARE IF:   Your fingers are swollen, very red, white, or cold and blue.   Your fingers are numb or tingling.   You have increasing pain.   You have difficulty moving your fingers.  MAKE SURE YOU:   Understand these instructions.   Will watch your condition.   Will get help right away if you are not  doing well or get worse.  Document Released: 07/23/2005 Document Revised: 10/02/2011 Document Reviewed: 12/04/2010 [facility] Patient Information 2012 [place], [place].

## 2012-05-26 NOTE — Assessment & Plan Note (Signed)
Patient has a hx carpel tunnel syndrome status post surgery x 2.  This pain could be flare up of OA vs. CTS vs. Trigger point.  Mobic provided minimal relief, deep tissue massage was effective.  Will treat with Prednisone burst and refer patient to PT.  Follow up in 1-2 months or sooner if symptoms worsen.

## 2012-05-26 NOTE — Progress Notes (Signed)
  Subjective:    Patient ID: Andrea Buck, female    DOB: 1946-06-04, 66 y.o.   MRN: [id number]  HPI  Patient presents to clinic for followup right shoulder and wrist pain. Patient was seen about 2 weeks ago and given a prescription for meloxicam to treat possible right sided trigger point.  Patient says meloxicam provided little relief. She also tried deep tissue massage which was helpful at that time. However, she continues to experience intermittent right wrist pain, right shoulder pain that radiates to right scapula.  Per patient, she has had a history of carpal tunnel syndrome and has had 2 surgeries in the past. Patient says pain is worse with movements of her wrist and shoulder. It has become difficult to drive, pick up objects with her fingers. Patient denies any numbness or tingling or decreased sensation of her right hand. She denies unilateral weakness.   Review of Systems  Per history of present illness    Objective:   Physical Exam  General: patient is in no acute distress, pleasant Musculoskeletal: 5 out of 5 strength upper extremities, bilateral. Sensation intact.  Positive pain with prayer test and reverse prayer test. Pain on palpation of right scapular border and paraspinal muscles. Full range of motion of right shoulder, no evidence of nerve impingement.     Assessment & Plan:

## 2012-05-31 DIAGNOSIS — J3800 Paralysis of vocal cords and larynx, unspecified: Secondary | ICD-10-CM | POA: Diagnosis not present

## 2012-05-31 DIAGNOSIS — R490 Dysphonia: Secondary | ICD-10-CM | POA: Diagnosis not present

## 2012-06-08 DIAGNOSIS — R490 Dysphonia: Secondary | ICD-10-CM | POA: Diagnosis not present

## 2012-06-24 ENCOUNTER — Encounter: Payer: Self-pay | Admitting: Home Health Services

## 2012-06-24 ENCOUNTER — Ambulatory Visit (INDEPENDENT_AMBULATORY_CARE_PROVIDER_SITE_OTHER): Payer: Medicare Other | Admitting: Home Health Services

## 2012-06-24 VITALS — BP 118/80 | HR 67 | Temp 98.0°F | Ht 65.0 in | Wt 200.4 lb

## 2012-06-24 DIAGNOSIS — Z0000 Encounter for general adult medical examination without abnormal findings: Secondary | ICD-10-CM

## 2012-06-24 NOTE — Progress Notes (Signed)
Patient here for annual wellness visit, patient reports: Risk Factors/Conditions needing evaluation or treatment: Pt does not have any new risk factors that need evaluation. Home Safety: Pt lives with granddaughter and her 3 children.  Grandson.  Pt reports having smoke detectors. Other Information: Corrective lens: Pt wears glasses for distance.  Visits eye dr annually. Being monitored for cataracts. Dentures: Pt has full upper dentures, partial bottom. Memory: Pt denies memory problems. Patient's Mini Mental Score (recorded in doc. flowsheet): 27  Balance/Gait:  Balance Abnormal Patient value  Sitting balance    Sit to stand x Uses arms  Attempts to arise    Immediate standing balance    Standing balance    Nudge    Eyes closed- Romberg    Tandem stance x unable  Back lean    Neck Rotation    360 degree turn    Sitting down x Uses arms   Gait Abnormal Patient value  Initiation of gait    Step length-left    Step length-right    Step height-left    Step height-right    Step symmetry    Step continuity    Path deviation    Trunk movement    Walking stance        Annual Wellness Visit Requirements Recorded Today In  Medical, family, social history Past Medical, Family, Social History Section  Current providers Care team  Current medications Medications  Wt, BP, Ht, BMI Vital signs  Tobacco, alcohol, illicit drug use History  ADL Nurse Assessment  Depression Screening Nurse Assessment  Cognitive impairment Nurse Assessment  Mini Mental Status Document Flowsheet  Fall Risk Nurse Assessment  Home Safety Progress Note  End of Life Planning (welcome visit) Social Documentation  Medicare preventative services Progress Note  Risk factors/conditions needing evaluation/treatment Progress Note  Personalized health advice Patient Instructions, goals, letter  Diet & Exercise Social Documentation  Emergency Contact Social Documentation  Seat Belts Social Documentation    Sun exposure/protection Social Documentation    Prevention Plan:   Recommended Medicare Prevention Screenings Women over 65 Test For Frequency Date of Last- BOLD if needed  Breast Cancer 1-2 yrs 2012  Cervical Cancer 1-3 yrs NI due to age  Colorectal Cancer 1-10 yrs Gave hemoccult card  Osteoporosis once 2008  Cholesterol 5 yrs 2011  Diabetes yearly 2011  HIV yearly declined  Influenza Shot yearly Get's vaccine at work  Pneumonia Shot once 2012  Zostavax Shot once Needs prescription at next pcp visit

## 2012-06-24 NOTE — Patient Instructions (Signed)
1. Continue to work on weight loss. 2. Focus on eating 3-5 fruits and vegetables a day. 3. Consider starting regular exercise routine such as walking 2-3 times a week. 4. Consider counseling if you feel you need to talk with someone about your stressors in life. 5. Remember to ask for shingles vaccine prescription at your next doctors visit.

## 2012-06-25 ENCOUNTER — Encounter: Payer: Self-pay | Admitting: Home Health Services

## 2012-06-25 NOTE — Progress Notes (Signed)
Patient ID: Andrea Buck, female   DOB: July 15, 1946, 66 y.o.   MRN: [id number]  I have reviewed this visit and discussed with Arlys John and agree with her documentation.

## 2012-07-15 LAB — HEMOCCULT GUIAC POC 1CARD (OFFICE)
Card #2 Fecal Occult Blod, POC: NEGATIVE
Card #3 Fecal Occult Blood, POC: NEGATIVE
Fecal Occult Blood, POC: NEGATIVE

## 2012-07-16 ENCOUNTER — Other Ambulatory Visit (INDEPENDENT_AMBULATORY_CARE_PROVIDER_SITE_OTHER): Payer: Medicare Other | Admitting: Family Medicine

## 2012-07-16 DIAGNOSIS — Z1211 Encounter for screening for malignant neoplasm of colon: Secondary | ICD-10-CM | POA: Diagnosis not present

## 2012-07-16 NOTE — Progress Notes (Signed)
Hemoccult stool cards returned by mail;  Developed 07-15-12,  The first 2 cards were too old >14 days and should be recollected and developed within 14 days.

## 2012-07-20 ENCOUNTER — Encounter: Payer: Self-pay | Admitting: Family Medicine

## 2012-07-26 ENCOUNTER — Ambulatory Visit: Payer: Medicare Other | Admitting: Family Medicine

## 2012-08-09 DIAGNOSIS — G902 Horner's syndrome: Secondary | ICD-10-CM | POA: Insufficient documentation

## 2012-08-09 DIAGNOSIS — H52209 Unspecified astigmatism, unspecified eye: Secondary | ICD-10-CM

## 2012-08-09 DIAGNOSIS — H02409 Unspecified ptosis of unspecified eyelid: Secondary | ICD-10-CM | POA: Insufficient documentation

## 2012-08-09 DIAGNOSIS — H524 Presbyopia: Secondary | ICD-10-CM

## 2012-08-09 DIAGNOSIS — H5210 Myopia, unspecified eye: Secondary | ICD-10-CM | POA: Insufficient documentation

## 2012-08-09 DIAGNOSIS — H25819 Combined forms of age-related cataract, unspecified eye: Secondary | ICD-10-CM | POA: Insufficient documentation

## 2012-09-14 ENCOUNTER — Encounter: Payer: Self-pay | Admitting: Sports Medicine

## 2012-09-14 ENCOUNTER — Ambulatory Visit (INDEPENDENT_AMBULATORY_CARE_PROVIDER_SITE_OTHER): Payer: Medicare Other | Admitting: Sports Medicine

## 2012-09-14 VITALS — BP 141/80 | HR 69 | Temp 98.1°F | Ht 65.0 in | Wt 200.0 lb

## 2012-09-14 DIAGNOSIS — M79609 Pain in unspecified limb: Secondary | ICD-10-CM

## 2012-09-14 DIAGNOSIS — G479 Sleep disorder, unspecified: Secondary | ICD-10-CM

## 2012-09-14 DIAGNOSIS — M19019 Primary osteoarthritis, unspecified shoulder: Secondary | ICD-10-CM

## 2012-09-14 DIAGNOSIS — M12819 Other specific arthropathies, not elsewhere classified, unspecified shoulder: Secondary | ICD-10-CM

## 2012-09-14 DIAGNOSIS — M79601 Pain in right arm: Secondary | ICD-10-CM

## 2012-09-14 DIAGNOSIS — M7710 Lateral epicondylitis, unspecified elbow: Secondary | ICD-10-CM

## 2012-09-14 DIAGNOSIS — M7711 Lateral epicondylitis, right elbow: Secondary | ICD-10-CM | POA: Insufficient documentation

## 2012-09-14 MED ORDER — PREDNISONE 50 MG PO TABS: 50.0000 mg | ORAL_TABLET | Freq: Every day | ORAL | Status: DC

## 2012-09-14 NOTE — Patient Instructions (Addendum)
It was nice to see you today.   Today we discussed: 1. Pain, arm, right  Follow up for X-rays of your neck and your shoulder.  PT will be contacting you  If not better we can discuss referring you to sports medicine for further imaging.  Take the steroid for 5 days  2. Sleep disorder  Continue your trazodone and neurontin.  Follow up with Dr. Domenick Bookbinder in 2 weeks to discuss your pain and your sleep further  3. Lateral epicondylitis of right elbow  Follow up with PT  Please plan to return to see Dr Domenick Bookbinder in 2 weeks.  If you need anything prior to seeing me please call the clinic.  Please Bring all medications with you to each appointment.

## 2012-09-14 NOTE — Progress Notes (Signed)
  [facility]  Patient name: Andrea Buck MRN [id number]  Date of birth: [birth year]  CC & HPI:  Andrea Buck is a 66 y.o. female presenting today for evaluation of:  # R shoulder pain:  Lateral deltoid, worse with movement  # R Wrist Pain: dorsum of wrist extending towards elbow, worse with hand shaking  # Neck Pain: R>L, restriction in ROM,   All of the above problems have been persistent for over the last 2 weeks in our recur and.  Last occurrence was in July.  And responded well to prednisone.  She is on Mobic chronically this has not relieved it.  She has not tried physical therapy.  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 She has never used smokeless tobacco.   Objective Findings:  Vitals:  Filed Vitals:   09/14/12 1543  BP: 141/80  Pulse: 69  Temp: 98.1 F (36.7 C)    PE: GENERAL:  Adult obese AA female. In no discomfort; no respiratory distress. NEUROMSK: Upper extremity myotomes intact however limited by pain.  No paresthesia.   NECK: negative Spurling's compression test, restricted motion in L cervical rotation and L cervical sidebending.  R posterior cervical muscle spasm  Shoulder: + empty can, + hawkins, neg apprehension, neg sulcus sign, neg speed's,   Wrist/Elbow: pain with wrist extension, TTP over lateral epicondylitis, pain over belly of extensor tendons, slightly swollen, no erythema    Assessment & Plan:

## 2012-09-14 NOTE — Assessment & Plan Note (Signed)
To  PT

## 2012-09-14 NOTE — Assessment & Plan Note (Signed)
Refer to X-ray to eval acromion PT as below >consider [place] at [facility] if not improved

## 2012-09-14 NOTE — Assessment & Plan Note (Signed)
Cervical Imaging given unclear etiology and prolonged duration and cervical motion restriction Refer to PT for cervical, L shoulder and L wrist/elbow pain.  Likely Supraspinatous Tear, and Lateral Epicondylitis, Steroid X 5 days given already on Mobic >consider referral to [facility] clinic for further imaging/evaluation if not improved in 3-4 weeks (if in rehab)\

## 2012-09-14 NOTE — Assessment & Plan Note (Signed)
Unclear etiology, on Neurontin and Trazodone but wakes up tired and sometimes has hang over effect from meds Treat underlying pain issues at this time. Consider Sleep study given obesity > f/u with Dr. Tye Savoy in 2 weeks for consideration of sleep study

## 2012-09-15 ENCOUNTER — Telehealth: Payer: Self-pay | Admitting: Sports Medicine

## 2012-09-15 ENCOUNTER — Encounter: Payer: Self-pay | Admitting: Sports Medicine

## 2012-09-15 ENCOUNTER — Ambulatory Visit
Admission: RE | Admit: 2012-09-15 | Discharge: 2012-09-15 | Disposition: A | Discharge status: Home / Self Care | Payer: Medicare Other | Source: Ambulatory Visit | Attending: Family Medicine | Admitting: Family Medicine

## 2012-09-15 DIAGNOSIS — M79601 Pain in right arm: Secondary | ICD-10-CM

## 2012-09-15 DIAGNOSIS — M5030 Other cervical disc degeneration, unspecified cervical region: Secondary | ICD-10-CM | POA: Diagnosis not present

## 2012-09-15 DIAGNOSIS — M898X9 Other specified disorders of bone, unspecified site: Secondary | ICD-10-CM | POA: Diagnosis not present

## 2012-09-15 NOTE — Telephone Encounter (Signed)
LVM regarding results of X-ray.  Plan for PT, f/u as previously discussed

## 2012-10-06 ENCOUNTER — Ambulatory Visit (INDEPENDENT_AMBULATORY_CARE_PROVIDER_SITE_OTHER): Payer: Medicare Other | Admitting: Family Medicine

## 2012-10-06 ENCOUNTER — Encounter: Payer: Self-pay | Admitting: Family Medicine

## 2012-10-06 VITALS — BP 148/80 | HR 69 | Temp 97.8°F | Ht 65.0 in | Wt 198.5 lb

## 2012-10-06 DIAGNOSIS — M12819 Other specific arthropathies, not elsewhere classified, unspecified shoulder: Secondary | ICD-10-CM

## 2012-10-06 DIAGNOSIS — G5600 Carpal tunnel syndrome, unspecified upper limb: Secondary | ICD-10-CM | POA: Diagnosis not present

## 2012-10-06 DIAGNOSIS — M19019 Primary osteoarthritis, unspecified shoulder: Secondary | ICD-10-CM

## 2012-10-06 NOTE — Assessment & Plan Note (Signed)
Improving status post Prednisone, Mobic, and continued PT. Continue working on the exercises that the PT has given for another month.   Patient counseled not to take the Mobic daily if it can be avoided due to the side effects.   If the pain is still persistent, consider steroid injection-  may be helpful but she stated that she wants to use that as a last resort.  RTC in 3 months or sooner as needed.

## 2012-10-06 NOTE — Progress Notes (Signed)
Subjective:     Patient ID: Andrea Buck, female   DOB: 01-23-1946, 66 y.o.   MRN: [id number]  HPI 1. Shoulder pain: Patient states that she is doing 50% better in terms of pain, strength and ROM. 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  She still feels pain upon extension, lifting anything and when sleeping on the right shoulder.  She has been taking Mobic daily for the pain.  She has also been taking Omega XL as a supplement and she says it has been helping. At this point she doesn't want to consider surgery as an option for her shoulder.  Patient has taken Prednisone burst (5 days) once in July and again in November with good relief.  2. Wrist pain:  She states that her wrist feels a little better. She feels pain in the dorsal aspect of her right hand. It's sharp in nature and is felt upon extension and at night, while sleeping.  She does not use brace while at work or while sleeping but does have one.  She at times feels a loss of strength and has to set things down in the fear of dropping them.  Patient able to ADLS without difficulty.  She is not interested in further nerve conduction studies at this time.  Review of Systems Within normal limits other than noted in HPI    Objective:   Physical Exam General: NAD  HEENT: NCAT NEURO: oriented x 3, alert  Cardio: S1S2, RRR, no murmurs  Resp: CTA, no rhonchi, rales or wheezes  MSK: right shoulder has active ROM to 180 degrees upon abduction and flexion.  Weakness noted on external rotation. Right wrist:  She has pain on extension. 5/5 strength on hand grip. No loss of sensation.     Assessment:     1. Right shoulder pain  2.  Right Wrist pain     Plan:

## 2012-10-06 NOTE — Assessment & Plan Note (Signed)
She was also instructed to continue exercises to help her wrist pain.  She is not interested in repeat nerve conduction studies.  She has had sx done in the past. Follow up in 3 months to reassess; sports medicine referral or Neuro referral may be needed at that time.

## 2012-10-06 NOTE — Patient Instructions (Addendum)
You are progressing very well.  Keep up the good work! Continue physical therapy for both shoulder and wrist. I will send in Mobic to your pharmacy.  Use only as needed. Schedule follow up with me in 3 months or if symptoms worsen. It was great to see you today.

## 2012-10-29 ENCOUNTER — Telehealth: Payer: Self-pay | Admitting: Family Medicine

## 2012-10-29 NOTE — Telephone Encounter (Signed)
Has been coughing for the last 3 days and nothing has helped- wants to know what she can do or if we can call something in for her.

## 2012-10-29 NOTE — Telephone Encounter (Signed)
Spoke with patient and she states she has had chills, but hasn't taken temp. Coughing and feels some SOB . She states she has bronchitis.  Advised  since we have no available appointment today , go to Urgent Care for evaluation. She voices understanding.

## 2012-11-01 ENCOUNTER — Encounter: Payer: Self-pay | Admitting: Family Medicine

## 2012-11-01 ENCOUNTER — Ambulatory Visit (INDEPENDENT_AMBULATORY_CARE_PROVIDER_SITE_OTHER): Payer: Medicare Other | Admitting: Family Medicine

## 2012-11-01 VITALS — BP 132/84 | HR 72 | Temp 98.5°F | Ht 65.0 in | Wt 196.7 lb

## 2012-11-01 DIAGNOSIS — B9789 Other viral agents as the cause of diseases classified elsewhere: Secondary | ICD-10-CM | POA: Diagnosis not present

## 2012-11-01 DIAGNOSIS — J988 Other specified respiratory disorders: Secondary | ICD-10-CM

## 2012-11-01 MED ORDER — HYDROCODONE-HOMATROPINE 5-1.5 MG/5ML PO SYRP: 5.0000 mL | ORAL_SOLUTION | Freq: Three times a day (TID) | ORAL | Status: DC | PRN

## 2012-11-01 NOTE — Patient Instructions (Addendum)
I am sorry for your troubles with this illness. You should be getting better within the next 48 hours. Use the cough syrup as needed. Also, use mucinex to thin the mucous and nyquil to help with sleep.   If your fever persists for past Wednesday or you have difficulty breathing, then please return to clinic.   Take Care,   Dr. Clinton Sawyer

## 2012-11-01 NOTE — Progress Notes (Signed)
  Subjective:    Patient ID: Andrea Buck, female    DOB: 07/27/1946, 67 y.o.   MRN: [id number]  HPI  67 year old F who presents for evaluation of cough and chest congestion of 5 days durations. Accompanied by fever up to 101.2 measured at home and chills. She denies myalgia but has chest pain with coughing. She has tried robitussin cough syrup, ibuprofen, and tylenol. Also tried cough lozenges that were helpful. Takes daily advair and PRN albuterol 3 times in last 48 hours. Has had some post-tussive emesis. Did have flu vaccine this year. Overall course is mildly improved from the weekend. Most bothersome symptoms are persistent cough and subsequent rib pain.   PMH - Notes history of bronchitis    Review of Systems See HPI    Objective:   Physical Exam BP 132/84  Pulse 72  Temp 98.5 F (36.9 C) (Oral)  Ht 5\' 5"  (1.651 m)  Wt 196 lb 11.2 oz (89.223 kg)  BMI 32.73 kg/m2 Gen: elderly AA female, ill appearing, NAD, persistently coughing during exam HEENT: no sinus tenderness, OP erythematous and moist,bilat submandib lymphadenopathy, boggy nasal turbinates CV: RRR, no m/r/g, no JVD or carotid bruits Pulm: normal WOB, CTA-B     Assessment & Plan:  67 year old F with improving viral respiratory infection. Give hycodan cough syrup for symptoms control and expect natural resolution in 4-7 days. F/u if worsening.

## 2012-11-08 DIAGNOSIS — J3800 Paralysis of vocal cords and larynx, unspecified: Secondary | ICD-10-CM | POA: Diagnosis not present

## 2013-05-06 DIAGNOSIS — H02409 Unspecified ptosis of unspecified eyelid: Secondary | ICD-10-CM | POA: Diagnosis not present

## 2013-05-06 DIAGNOSIS — H11829 Conjunctivochalasis, unspecified eye: Secondary | ICD-10-CM | POA: Insufficient documentation

## 2013-05-06 DIAGNOSIS — H0289 Other specified disorders of eyelid: Secondary | ICD-10-CM | POA: Diagnosis not present

## 2013-05-06 DIAGNOSIS — H02839 Dermatochalasis of unspecified eye, unspecified eyelid: Secondary | ICD-10-CM | POA: Diagnosis not present

## 2013-05-06 DIAGNOSIS — H5210 Myopia, unspecified eye: Secondary | ICD-10-CM | POA: Diagnosis not present

## 2013-05-06 DIAGNOSIS — G909 Disorder of the autonomic nervous system, unspecified: Secondary | ICD-10-CM | POA: Diagnosis not present

## 2013-05-06 DIAGNOSIS — H2589 Other age-related cataract: Secondary | ICD-10-CM | POA: Diagnosis not present

## 2013-06-08 DIAGNOSIS — J3800 Paralysis of vocal cords and larynx, unspecified: Secondary | ICD-10-CM | POA: Diagnosis not present

## 2013-06-15 DIAGNOSIS — D447 Neoplasm of uncertain behavior of aortic body and other paraganglia: Secondary | ICD-10-CM | POA: Diagnosis not present

## 2013-06-15 DIAGNOSIS — Z9889 Other specified postprocedural states: Secondary | ICD-10-CM | POA: Diagnosis not present

## 2013-07-12 ENCOUNTER — Encounter: Payer: Self-pay | Admitting: Home Health Services

## 2013-07-21 ENCOUNTER — Ambulatory Visit (INDEPENDENT_AMBULATORY_CARE_PROVIDER_SITE_OTHER): Payer: Medicare Other | Admitting: Family Medicine

## 2013-07-21 ENCOUNTER — Encounter: Payer: Self-pay | Admitting: Family Medicine

## 2013-07-21 VITALS — BP 140/70 | HR 62 | Ht 65.0 in | Wt 198.0 lb

## 2013-07-21 DIAGNOSIS — R0609 Other forms of dyspnea: Secondary | ICD-10-CM | POA: Diagnosis not present

## 2013-07-21 DIAGNOSIS — F411 Generalized anxiety disorder: Secondary | ICD-10-CM

## 2013-07-21 DIAGNOSIS — R0600 Dyspnea, unspecified: Secondary | ICD-10-CM | POA: Insufficient documentation

## 2013-07-21 DIAGNOSIS — M129 Arthropathy, unspecified: Secondary | ICD-10-CM | POA: Diagnosis not present

## 2013-07-21 DIAGNOSIS — I10 Essential (primary) hypertension: Secondary | ICD-10-CM | POA: Diagnosis not present

## 2013-07-21 DIAGNOSIS — F339 Major depressive disorder, recurrent, unspecified: Secondary | ICD-10-CM

## 2013-07-21 DIAGNOSIS — Z23 Encounter for immunization: Secondary | ICD-10-CM

## 2013-07-21 LAB — CBC WITH DIFFERENTIAL/PLATELET
Basophils Absolute: 0 10*3/uL (ref 0.0–0.1)
Basophils Relative: 1 % (ref 0–1)
Eosinophils Absolute: 0.2 10*3/uL (ref 0.0–0.7)
Eosinophils Relative: 4 % (ref 0–5)
Lymphocytes Relative: 45 % (ref 12–46)
Lymphs Abs: 1.7 10*3/uL (ref 0.7–4.0)
MCV: 89.4 fL (ref 78.0–100.0)
Monocytes Absolute: 0.3 10*3/uL (ref 0.1–1.0)
Neutro Abs: 1.7 10*3/uL (ref 1.7–7.7)
Platelets: 210 10*3/uL (ref 150–400)
RBC: 4.07 MIL/uL (ref 3.87–5.11)
RDW: 14.2 % (ref 11.5–15.5)
WBC: 3.8 10*3/uL — ABNORMAL LOW (ref 4.0–10.5)

## 2013-07-21 LAB — LIPID PANEL
Cholesterol: 151 mg/dL (ref 0–200)
Triglycerides: 86 mg/dL (ref <150)
VLDL: 17 mg/dL (ref 0–40)

## 2013-07-21 LAB — BASIC METABOLIC PANEL
Calcium: 9 mg/dL (ref 8.4–10.5)
Chloride: 106 mEq/L (ref 96–112)
Creat: 0.85 mg/dL (ref 0.50–1.10)
Glucose, Bld: 101 mg/dL — ABNORMAL HIGH (ref 70–99)

## 2013-07-21 LAB — TSH: TSH: 1.997 u[IU]/mL (ref 0.350–4.500)

## 2013-07-21 MED ORDER — SERTRALINE HCL 100 MG PO TABS: 100.0000 mg | ORAL_TABLET | Freq: Every day | ORAL | Status: DC

## 2013-07-21 MED ORDER — BUSPIRONE HCL 7.5 MG PO TABS: 7.5000 mg | ORAL_TABLET | Freq: Two times a day (BID) | ORAL | Status: DC

## 2013-07-21 MED ORDER — HYDROCHLOROTHIAZIDE 25 MG PO TABS: 25.0000 mg | ORAL_TABLET | Freq: Every day | ORAL | Status: DC

## 2013-07-21 MED ORDER — OMEPRAZOLE 20 MG PO CPDR: 20.0000 mg | DELAYED_RELEASE_CAPSULE | Freq: Two times a day (BID) | ORAL | Status: DC

## 2013-07-21 MED ORDER — GABAPENTIN 300 MG PO CAPS: 300.0000 mg | ORAL_CAPSULE | Freq: Three times a day (TID) | ORAL | Status: DC

## 2013-07-21 MED ORDER — MELOXICAM 7.5 MG PO TABS: 7.5000 mg | ORAL_TABLET | Freq: Every day | ORAL | Status: DC

## 2013-07-21 MED ORDER — LISINOPRIL 10 MG PO TABS: 10.0000 mg | ORAL_TABLET | Freq: Every day | ORAL | Status: DC

## 2013-07-21 NOTE — Assessment & Plan Note (Signed)
Patient admits she has not been taking BuSpar as directed prior. She states she does feel like she had some benefit from the medication. will prescribe scheduled medication. Educated patient on proper use of BuSpar start her on low dose twice a day He is to followup with me in 3 weeks

## 2013-07-21 NOTE — Assessment & Plan Note (Signed)
Patient with increased symptoms of arthritis after running out of Mobic. Will refill Mobic today.

## 2013-07-21 NOTE — Patient Instructions (Signed)
It was a pleasure meeting you today Andrea Buck. I have ordered some basic lab works to follow up on your blood count, thyroid, and cholesterol. I have also ordered you a chest x-ray for you to get some point today when he can work in your schedule. We will give you a flu shot as well today. I will call you with the results of your chest x-ray when they come in.  As long as all of your lab work and chest x-ray is clear today I do not see a reason to see you back for about  2 months and that would be basically just to check up on your anxiety and depression with the start of BuSpar daily. These make an appointment on your way out today for 2 months.  I recommend you attempt to figure out when her last colonoscopy was and I will go through your chart to look at as well and if it was hypertension years and I recommend you get your colonoscopy screening completed. As well as your mammogram every 2 years.  Keeping You Healthy  Get These Tests  Blood Pressure- Have your blood pressure checked by your healthcare provider at least once a year.  Normal blood pressure is 120/80.  Weight- Have your body mass index (BMI) calculated to screen for obesity.  BMI is a measure of body fat based on height and weight.  You can calculate your own BMI at https://www.west-esparza.com/  Cholesterol- Have your cholesterol checked every year.  Diabetes- Have your blood sugar checked every year if you have high blood pressure, high cholesterol, a family history of diabetes or if you are overweight.  Pap Smear- Have a pap smear every 1 to 3 years if you have been sexually active.  If you are older than 65 and recent pap smears have been normal you may not need additional pap smears.  In addition, if you have had a hysterectomy  For benign disease additional pap smears are not necessary.  Mammogram-Yearly mammograms are essential for early detection of breast cancer  Screening for Colon Cancer- Colonoscopy starting at age 31.  Screening may begin sooner depending on your family history and other health conditions.  Follow up colonoscopy as directed by your Gastroenterologist.  Screening for Osteoporosis- Screening begins at age 62 with bone density scanning, sooner if you are at higher risk for developing Osteoporosis.  Get these medicines  Calcium with Vitamin D- Your body requires 1200-1500 mg of Calcium a day and [phone] IU of Vitamin D a day.  You can only absorb 500 mg of Calcium at a time therefore Calcium must be taken in 2 or 3 separate doses throughout the day.  Hormones- Hormone therapy has been associated with increased risk for certain cancers and heart disease.  Talk to your healthcare provider about if you need relief from menopausal symptoms.  Aspirin- Ask your healthcare provider about taking Aspirin to prevent Heart Disease and Stroke.  Get these Immuniztions  Flu shot- Every fall  Pneumonia shot- Once after the age of 30; if you are younger ask your healthcare provider if you need a pneumonia shot.  Tetanus- Every ten years.  Zostavax- Once after the age of 30 to prevent shingles.  Take these steps  Don't smoke- Your healthcare provider can help you quit. For tips on how to quit, ask your healthcare provider or go to www.smokefree.gov or call 1-800 QUIT-NOW.  Be physically active- Exercise 5 days a week for a minimum of 30 minutes.  If you are not already physically active, start slow and gradually work up to 30 minutes of moderate physical activity.  Try walking, dancing, bike riding, swimming, etc.  Eat a healthy diet- Eat a variety of healthy foods such as fruits, vegetables, whole grains, low fat milk, low fat cheeses, yogurt, lean meats, chicken, fish, eggs, dried beans, tofu, etc.  For more information go to www.thenutritionsource.org  Dental visit- Brush and floss teeth twice daily; visit your dentist twice a year.  Eye exam- Visit your Optometrist or Ophthalmologist  yearly.  Drink alcohol in moderation- Limit alcohol intake to one drink or less a day.  Never drink and drive.  Depression- Your emotional health is as important as your physical health.  If you're feeling down or losing interest in things you normally enjoy, please talk to your healthcare provider.  Seat Belts- can save your life; always wear one  Smoke/Carbon Monoxide detectors- These detectors need to be installed on the appropriate level of your home.  Replace batteries at least once a year.  Violence- If anyone is threatening or hurting you, please tell your healthcare provider.  Living Will/ Health care power of attorney- Discuss with your healthcare provider and family.

## 2013-07-21 NOTE — Assessment & Plan Note (Signed)
Unlikely pneumonia however patient does have increased risk of aspiration with her vocal cord paralysis. Although she has been afebrile, she does admit to chills and pain with deep inspiration on the right side for this reason I will obtain a chest x-ray. We will call patient with results.

## 2013-07-21 NOTE — Assessment & Plan Note (Signed)
Patient states she feels great on Zoloft. She would like to schedule BuSpar

## 2013-07-21 NOTE — Progress Notes (Signed)
Subjective:     Patient ID: Andrea Buck, female   DOB: [birth year], 67 y.o.   MRN: [id number]  HPI  Ms. Andrea Buck is an established patient at this clinic but I am her new PCP, this is her initial visit.  Hypertension: Patient is at goal today for age bracket. She currently is taking lisinopril, HCTZ. She has no complaints of headaches dizziness or changes in vision.  Anxiety/depression Has history of anxiety and depression. She currently takes Zoloft and BuSpar. She states the BuSpar is not every day and she takes as needed. Oddly she's only had one prescription of 90 pills in over a year. Since she has been on the Zoloft she feels increasingly more stabilized and able to control her mood. She desires to stay on Zoloft and BuSpar  Arthritis:  Patient states that last week when she made this appointment her left knee with burning and so was her fingers bilaterally. He states she tried to stand up and almost collapsed from the pain in her left knee. She did not have a fever and she did not notice any redness or swelling in that joint. She states she has been out of her Mobic for some time, prior to this her arthritis was controlled.  Dyspnea:  Patient denies fevers but does endorse chills over the last few days and feeling like "beat up and drug through the mud ". She does not recall aspirating. She does have increased aspiration risk with her vocal cord paralysis from her globus tumor removal. He states he's had some mild fatigue over the last few days as well. No cough, but she has noticed right-sided pain with deep breath that she describes as a catch. She states the pain is underneath her right rib cage but towards her back. She is status post gallbladder removal. She's concerned she has pneumonia.  Health maintenance: Patient is agreeable to flu shot today. It appears as been a while since we obtain routine labs Will order CBC, BMP, TSH, cholesterol level today as patient to have colonoscopy  screening completed. History of endometrial cancer in mother. Advised mammogram yearly.  Patient's past medical, social, and family history were reviewed and updated as appropriate. Review of Systems Negative, with the exception of above mentioned in HPI     Objective:   Physical Exam BP 140/70  Pulse 62  Ht 5\' 5"  (1.651 m)  Wt 198 lb (89.812 kg)  BMI 32.95 kg/m2 Gen: NAD. Pleasant. Soft-spoken due to vocal cord paralysis. HEENT: AT. [place]. Bilateral TM visualized and normal in appearance. Bilateral eyes without injections or icterus. MMM. Bilateral nares normal. Throat without erythema or exudates.  CV: RRR  Chest: CTAB, no wheeze or crackles Abd: Soft. . NTND. BS present. No Masses palpated.  Ext: No erythema. No edema.  Skin: No rashes, purpura or petechiae.  Neuro:   PERLA. EOMi. Alert. Grossly intact.

## 2013-07-29 ENCOUNTER — Encounter: Payer: Self-pay | Admitting: Family Medicine

## 2013-08-05 ENCOUNTER — Telehealth: Payer: Self-pay | Admitting: Family Medicine

## 2013-08-05 ENCOUNTER — Ambulatory Visit (HOSPITAL_COMMUNITY)
Admission: RE | Admit: 2013-08-05 | Discharge: 2013-08-05 | Disposition: A | Discharge status: Home / Self Care | Payer: Medicare Other | Source: Ambulatory Visit | Attending: Family Medicine | Admitting: Family Medicine

## 2013-08-05 DIAGNOSIS — R05 Cough: Secondary | ICD-10-CM | POA: Diagnosis not present

## 2013-08-05 DIAGNOSIS — M4120 Other idiopathic scoliosis, site unspecified: Secondary | ICD-10-CM | POA: Insufficient documentation

## 2013-08-05 DIAGNOSIS — R0609 Other forms of dyspnea: Secondary | ICD-10-CM | POA: Diagnosis not present

## 2013-08-05 DIAGNOSIS — R059 Cough, unspecified: Secondary | ICD-10-CM | POA: Diagnosis not present

## 2013-08-05 DIAGNOSIS — R0600 Dyspnea, unspecified: Secondary | ICD-10-CM

## 2013-08-05 DIAGNOSIS — R0989 Other specified symptoms and signs involving the circulatory and respiratory systems: Secondary | ICD-10-CM | POA: Insufficient documentation

## 2013-08-05 NOTE — Telephone Encounter (Signed)
Spoke whit patient and informed her of below

## 2013-08-05 NOTE — Telephone Encounter (Signed)
Please call Patient and inform her the cxr was normal. Thanks.

## 2013-08-15 ENCOUNTER — Other Ambulatory Visit: Payer: Self-pay

## 2013-08-15 DIAGNOSIS — Z1231 Encounter for screening mammogram for malignant neoplasm of breast: Secondary | ICD-10-CM

## 2013-08-17 ENCOUNTER — Ambulatory Visit
Admission: RE | Admit: 2013-08-17 | Discharge: 2013-08-17 | Disposition: A | Discharge status: Home / Self Care | Payer: Medicare Other | Source: Ambulatory Visit

## 2013-08-17 DIAGNOSIS — Z1231 Encounter for screening mammogram for malignant neoplasm of breast: Secondary | ICD-10-CM

## 2013-08-22 ENCOUNTER — Other Ambulatory Visit: Payer: Self-pay | Admitting: Family Medicine

## 2013-08-22 DIAGNOSIS — R928 Other abnormal and inconclusive findings on diagnostic imaging of breast: Secondary | ICD-10-CM

## 2013-08-25 ENCOUNTER — Telehealth: Payer: Self-pay | Admitting: Family Medicine

## 2013-08-25 NOTE — Telephone Encounter (Signed)
[id number]  Patient's recent screening mammogram was routed to my inbox; the study report indicates abnormalities and recommends further imaging, with plan to contact the patient.  I attempted to call her at the mobile number in chart, left message asking her to contact us.  When/if she calls back, please ask if she has been contacted regarding her mammography results.  JB

## 2013-08-26 ENCOUNTER — Encounter: Payer: Self-pay | Admitting: Family Medicine

## 2013-08-31 ENCOUNTER — Telehealth: Payer: Self-pay | Admitting: Family Medicine

## 2013-08-31 NOTE — Telephone Encounter (Signed)
Pt called and would like the doctor to call something in for her cough and also she has pink eye and needs antibiotics for that also. JW

## 2013-09-01 NOTE — Telephone Encounter (Signed)
Unable to reach patient.no voicemail. Andrea Buck, Andrea Buck

## 2013-09-01 NOTE — Telephone Encounter (Signed)
She needs to make an appointment to be evaluated. Please make her aware. Thanks

## 2013-09-02 ENCOUNTER — Encounter: Payer: Self-pay | Admitting: Family Medicine

## 2013-09-02 ENCOUNTER — Ambulatory Visit (INDEPENDENT_AMBULATORY_CARE_PROVIDER_SITE_OTHER): Payer: Medicare Other | Admitting: Family Medicine

## 2013-09-02 VITALS — BP 147/79 | HR 76 | Temp 98.2°F | Ht 65.0 in | Wt 196.7 lb

## 2013-09-02 DIAGNOSIS — J209 Acute bronchitis, unspecified: Secondary | ICD-10-CM | POA: Insufficient documentation

## 2013-09-02 DIAGNOSIS — J40 Bronchitis, not specified as acute or chronic: Secondary | ICD-10-CM | POA: Insufficient documentation

## 2013-09-02 MED ORDER — GUAIFENESIN-CODEINE 100-10 MG/5ML PO SOLN: 5.0000 mL | Freq: Four times a day (QID) | ORAL | Status: DC | PRN

## 2013-09-02 NOTE — Assessment & Plan Note (Signed)
Pt with productive cough, laryngitis, and URI Sx for the past 4 days.  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  She denies recent travel, she does not smoke, however, her granddaughter does smoke pretty consistently around her. She denies any fever, chills, or sweats, has had good oral intake and UOP.  She has tried some mucinex which has seemed to help, but otherwise has tried some warm fluids and nothing else.  She has started to feel slightly better but has lost her voice over the past day.  She most likely has acute bronchitis, viral origin, with some laryngitis.  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  Recommend continued hydration with honey, delsym for cough, nasal saline, and will call in Robitussin AC qhs PRN since pt has been unable to sleep.  She has required this in the past and has no allergy to codeine.  Recommend f/u in one week if no improvement.

## 2013-09-02 NOTE — Progress Notes (Signed)
JACKLINE CASTILLA is a 67 y.o. female who presents today for laryngitis and cough.  Pt with productive cough, laryngitis, and URI Sx for the past 4 days.  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  She denies recent travel, she does not smoke, however, her granddaughter does smoke pretty consistently around her. She denies any fever, chills, or sweats, has had good oral intake and UOP.  She has tried some mucinex which has seemed to help, but otherwise has tried some warm fluids and nothing else.  She has started to feel slightly better but has lost her voice over the past day.   Past Medical History  Diagnosis Date  . Depression   . Blood transfusion     after hysterectomy  . Carpal tunnel syndrome 12/24/2006    Qualifier: Diagnosis of  By: Dewitt Rota    . CHOLELITHIASIS 04/25/2009    Qualifier: Diagnosis of  By: Karn Pickler MD, Shanda Bumps    . NECK MASS 12/29/2006    Qualifier: Diagnosis of  By: Christell Constant CMA, [place]    . GLOMUS TUMOR 2001    Qualifier: Diagnosis of  By: Karn Pickler MD, Shanda Bumps    . Vocal cord paralysis 05/26/2011    RIGHT vocal cord parlaysis Seen by Dr,. Rayford Halsted at [facility].( May 03, 2011)  Placed on scopalamine patch and w f/u [facility]   . GASTROESOPHAGEAL REFLUX, NO ESOPHAGITIS 12/24/2006    Qualifier: Diagnosis of  By: Dewitt Rota      History  Smoking status  . Never Smoker   Smokeless tobacco  . Never Used    Family History  Problem Relation Age of Onset  . Cancer Mother     Uterus  . Diabetes Mother   . Heart disease Father     Current Outpatient Prescriptions on File Prior to Visit  Medication Sig Dispense Refill  . albuterol (PROVENTIL HFA;VENTOLIN HFA) 108 (90 BASE) MCG/ACT inhaler Inhale 2 puffs into the lungs every 6 (six) hours as needed for wheezing.  1 Inhaler  0  . busPIRone (BUSPAR) 7.5 MG tablet Take 1 tablet (7.5 mg total) by mouth 2 (two) times daily.  60 tablet  1   . calcium-vitamin D (SM CALCIUM-VITAMIN D) 500-200 MG-UNIT per tablet Take 1 tablet by mouth 2 (two) times daily.  60 tablet  6  . Fluticasone-Salmeterol (ADVAIR DISKUS) 250-50 MCG/DOSE AEPB Inhale 1 puff into the lungs 2 (two) times daily.  60 each  1  . gabapentin (NEURONTIN) 300 MG capsule Take 1 capsule (300 mg total) by mouth 3 (three) times daily.  90 capsule  3  . hydrochlorothiazide (HYDRODIURIL) 25 MG tablet Take 1 tablet (25 mg total) by mouth daily.  30 tablet  3  . lisinopril (PRINIVIL,ZESTRIL) 10 MG tablet Take 1 tablet (10 mg total) by mouth daily.  30 tablet  3  . meloxicam (MOBIC) 7.5 MG tablet Take 1 tablet (7.5 mg total) by mouth daily.  30 tablet  3  . mometasone (NASONEX) 50 MCG/ACT nasal spray Place 1 spray into the nose daily.  17 g  3  . omeprazole (PRILOSEC) 20 MG capsule Take 1 capsule (20 mg total) by mouth 2 (two) times daily.  30 capsule  6  . sertraline (ZOLOFT) 100 MG tablet Take 1 tablet (100 mg total) by mouth daily.  30 tablet  3  . traZODone (DESYREL) 50 MG  tablet Take 1 tablet (50 mg total) by mouth at bedtime.  30 tablet  3   Current Facility-Administered Medications on File Prior to Visit  Medication Dose Route Frequency Provider Last Rate Last Dose  . pneumococcal 23 valent vaccine (PNU-IMMUNE) injection 0.5 mL  0.5 mL Intramuscular Once Olivia Mackie, MD        ROS: Per HPI.  All other systems reviewed and are negative.   Physical Exam Filed Vitals:   09/02/13 1613  BP: 147/79  Pulse: 76  Temp: 98.2 F (36.8 C)    Physical Examination: General appearance - alert, well appearing, and in no distress Eyes - left eye normal, right eye normal Ears - right ear normal, left ear normal Mouth - mucous membranes moist, pharynx normal without lesions Chest - clear to auscultation, no wheezes, rales or rhonchi, symmetric air entry Heart - normal rate and regular rhythm, no murmurs noted Neck: No LAD

## 2013-09-02 NOTE — Patient Instructions (Signed)
Andrea Buck, it was nice seeing you today.  Please continue with the hydration and cough medication as needed.  If you are not feeling better by the beginning of next week, please call us.    Thanks, Dr. Paulina Fusi

## 2013-09-06 ENCOUNTER — Ambulatory Visit
Admission: RE | Admit: 2013-09-06 | Discharge: 2013-09-06 | Disposition: A | Discharge status: Home / Self Care | Payer: Medicare Other | Source: Ambulatory Visit | Attending: Family Medicine | Admitting: Family Medicine

## 2013-09-06 DIAGNOSIS — R928 Other abnormal and inconclusive findings on diagnostic imaging of breast: Secondary | ICD-10-CM | POA: Diagnosis not present

## 2013-11-09 DIAGNOSIS — H2589 Other age-related cataract: Secondary | ICD-10-CM | POA: Diagnosis not present

## 2013-11-09 DIAGNOSIS — H02409 Unspecified ptosis of unspecified eyelid: Secondary | ICD-10-CM | POA: Diagnosis not present

## 2013-11-09 DIAGNOSIS — H0289 Other specified disorders of eyelid: Secondary | ICD-10-CM | POA: Diagnosis not present

## 2013-11-09 DIAGNOSIS — G909 Disorder of the autonomic nervous system, unspecified: Secondary | ICD-10-CM | POA: Diagnosis not present

## 2013-11-09 DIAGNOSIS — H11829 Conjunctivochalasis, unspecified eye: Secondary | ICD-10-CM | POA: Diagnosis not present

## 2013-11-09 DIAGNOSIS — H5210 Myopia, unspecified eye: Secondary | ICD-10-CM | POA: Diagnosis not present

## 2013-12-14 DIAGNOSIS — H2589 Other age-related cataract: Secondary | ICD-10-CM | POA: Diagnosis not present

## 2013-12-19 DIAGNOSIS — H269 Unspecified cataract: Secondary | ICD-10-CM | POA: Diagnosis not present

## 2013-12-19 DIAGNOSIS — J45909 Unspecified asthma, uncomplicated: Secondary | ICD-10-CM | POA: Diagnosis not present

## 2013-12-19 DIAGNOSIS — H2589 Other age-related cataract: Secondary | ICD-10-CM | POA: Diagnosis not present

## 2013-12-19 DIAGNOSIS — F411 Generalized anxiety disorder: Secondary | ICD-10-CM | POA: Diagnosis not present

## 2013-12-19 DIAGNOSIS — F3289 Other specified depressive episodes: Secondary | ICD-10-CM | POA: Diagnosis not present

## 2013-12-19 DIAGNOSIS — K219 Gastro-esophageal reflux disease without esophagitis: Secondary | ICD-10-CM | POA: Diagnosis not present

## 2013-12-19 DIAGNOSIS — F458 Other somatoform disorders: Secondary | ICD-10-CM | POA: Diagnosis not present

## 2013-12-19 DIAGNOSIS — F329 Major depressive disorder, single episode, unspecified: Secondary | ICD-10-CM | POA: Diagnosis not present

## 2013-12-19 DIAGNOSIS — I10 Essential (primary) hypertension: Secondary | ICD-10-CM | POA: Diagnosis not present

## 2013-12-20 DIAGNOSIS — Z961 Presence of intraocular lens: Secondary | ICD-10-CM | POA: Diagnosis not present

## 2014-01-02 DIAGNOSIS — J3800 Paralysis of vocal cords and larynx, unspecified: Secondary | ICD-10-CM | POA: Diagnosis not present

## 2014-01-04 ENCOUNTER — Encounter: Payer: Self-pay | Admitting: Family Medicine

## 2014-01-10 DIAGNOSIS — H2589 Other age-related cataract: Secondary | ICD-10-CM | POA: Diagnosis not present

## 2014-01-10 DIAGNOSIS — Z48810 Encounter for surgical aftercare following surgery on the sense organs: Secondary | ICD-10-CM | POA: Diagnosis not present

## 2014-01-16 DIAGNOSIS — H269 Unspecified cataract: Secondary | ICD-10-CM | POA: Diagnosis not present

## 2014-01-16 DIAGNOSIS — K219 Gastro-esophageal reflux disease without esophagitis: Secondary | ICD-10-CM | POA: Diagnosis not present

## 2014-01-16 DIAGNOSIS — Z9889 Other specified postprocedural states: Secondary | ICD-10-CM | POA: Diagnosis not present

## 2014-01-16 DIAGNOSIS — H2589 Other age-related cataract: Secondary | ICD-10-CM | POA: Diagnosis not present

## 2014-01-16 DIAGNOSIS — J45909 Unspecified asthma, uncomplicated: Secondary | ICD-10-CM | POA: Diagnosis not present

## 2014-01-16 DIAGNOSIS — J3800 Paralysis of vocal cords and larynx, unspecified: Secondary | ICD-10-CM | POA: Diagnosis not present

## 2014-01-16 DIAGNOSIS — I10 Essential (primary) hypertension: Secondary | ICD-10-CM | POA: Diagnosis not present

## 2014-01-17 DIAGNOSIS — Z961 Presence of intraocular lens: Secondary | ICD-10-CM | POA: Diagnosis not present

## 2014-01-19 DIAGNOSIS — IMO0001 Reserved for inherently not codable concepts without codable children: Secondary | ICD-10-CM | POA: Diagnosis not present

## 2014-01-19 DIAGNOSIS — R490 Dysphonia: Secondary | ICD-10-CM | POA: Diagnosis not present

## 2014-01-24 DIAGNOSIS — Z961 Presence of intraocular lens: Secondary | ICD-10-CM | POA: Diagnosis not present

## 2014-02-14 DIAGNOSIS — Z961 Presence of intraocular lens: Secondary | ICD-10-CM | POA: Diagnosis not present

## 2014-04-05 ENCOUNTER — Encounter: Payer: Self-pay | Admitting: Family Medicine

## 2014-04-05 ENCOUNTER — Ambulatory Visit (INDEPENDENT_AMBULATORY_CARE_PROVIDER_SITE_OTHER): Payer: Medicare Other | Admitting: Family Medicine

## 2014-04-05 VITALS — BP 156/81 | HR 64 | Temp 97.5°F | Ht 65.0 in | Wt 202.0 lb

## 2014-04-05 DIAGNOSIS — J3800 Paralysis of vocal cords and larynx, unspecified: Secondary | ICD-10-CM

## 2014-04-05 DIAGNOSIS — M25539 Pain in unspecified wrist: Secondary | ICD-10-CM | POA: Diagnosis not present

## 2014-04-05 DIAGNOSIS — M25532 Pain in left wrist: Secondary | ICD-10-CM | POA: Insufficient documentation

## 2014-04-05 NOTE — Progress Notes (Signed)
   Subjective:    Patient ID: Andrea Buck, female    DOB: June 29, 1946, 68 y.o.   MRN: [id number]  HPI Andrea Buck is a 68 y.o. African American female presents to family medicine clinic today for left wrist pain and concerns over vocal cord paralysis.  Left wrist pain: Patient states that for the last 6-8 months she has noticed her wrist has increased pain as well as swelling. She has battling this over the course of 5-10 years. However he has become increasingly painful. She states that it will swell up, without erythema. She states she experiences no pain at rest but with movement she gets pain inside of her wrist. She has noticed that when she picks up heavy objects she drops it because she gets a sharp pain in the wrist. She has a history of bilateral carpal tunnel with repair she states was "years ago ". She takes over-the-counter NSAIDs for swelling and pain. He has no history of gout.  Vocal cord paralysis: Patient has had close followup with a otolaryngologist in [place]. It sounds from patient's description there is not much more that he feels that he can do for her. She is experiencing changes in her voice and is concerned, and doesn't want to live the rest of her life with as the way it is. She is asking for a second opinion for an otolaryngologist.   Review of Systems Negative, with the exception of above mentioned in HPI    Objective:   Physical Exam BP 156/81  Pulse 64  Temp(Src) 97.5 F (36.4 C) (Oral)  Ht 5\' 5"  (1.651 m)  Wt 202 lb (91.627 kg)  BMI 33.61 kg/m2 Gen: Pleasant, well-developed, mildly obese, no acute distress, nontoxic in appearance Chest: CTAB, no wheeze or crackles Ext: Left wrist with no erythema. Moderate swelling of the volar wrist. Tender to palpation over her volar wrist and metacarpals. Active range of motion with pain. Passive range of motion decreased flexion and extension, with pain. Negative Tinel's. Palpable tissue texture changes  questionable ganglion cyst. Skin: No rashes, purpura or petechiae.

## 2014-04-05 NOTE — Assessment & Plan Note (Signed)
Patient with likely ganglion versus synovial tear in wrist. Will send to hand surgery for for evaluation.

## 2014-04-05 NOTE — Assessment & Plan Note (Signed)
Patient with history of right vocal cord paralysis. In by Dr. Denyse [facility]. Patient would like a second opinion. Will attempt to put an otolaryngology referral for her.

## 2014-04-05 NOTE — Patient Instructions (Signed)
Wrist Pain Wrist injuries are frequent in adults and children. A sprain is an injury to the ligaments that hold your bones together. A strain is an injury to muscle or muscle cord-like structures (tendons) from stretching or pulling. Generally, when wrists are moderately tender to touch following a fall or injury, a break in the bone (fracture) may be present. Most wrist sprains or strains are better in 3 to 5 days, but complete healing may take several weeks. HOME CARE INSTRUCTIONS   Put ice on the injured area.  Put ice in a plastic bag.  Place a towel between your skin and the bag.  Leave the ice on for 15-20 minutes, 03-04 times a day, for the first 2 days.  Keep your arm raised above the level of your heart whenever possible to reduce swelling and pain.  Rest the injured area for at least 48 hours or as directed by your caregiver.  If a splint or elastic bandage has been applied, use it for as long as directed by your caregiver or until seen by a caregiver for a follow-up exam.  Only take over-the-counter or prescription medicines for pain, discomfort, or fever as directed by your caregiver.  Keep all follow-up appointments. You may need to follow up with a specialist or have follow-up X-rays. Improvement in pain level is not a guarantee that you did not fracture a bone in your wrist. The only way to determine whether or not you have a broken bone is by X-ray. SEEK IMMEDIATE MEDICAL CARE IF:   Your fingers are swollen, very red, white, or cold and blue.  Your fingers are numb or tingling.  You have increasing pain.  You have difficulty moving your fingers. MAKE SURE YOU:   Understand these instructions.  Will watch your condition.  Will get help right away if you are not doing well or get worse. Document Released: 07/23/2005 Document Revised: 01/05/2012 Document Reviewed: 12/04/2010 [facility] Patient Information 2014 [place].

## 2014-04-12 ENCOUNTER — Telehealth: Payer: Self-pay | Admitting: *Deleted

## 2014-04-12 NOTE — Telephone Encounter (Signed)
LMOVM for pt to return call.  Please inform of the below:  6.19.15 @ 915 am [facility] [place] Phone: [phone]  Please document if pt receives message. Fleeger, Andrea Buck

## 2014-04-18 ENCOUNTER — Telehealth: Payer: Self-pay | Admitting: *Deleted

## 2014-04-18 NOTE — Telephone Encounter (Signed)
LMOVM for pt to return call. Please inform of the below:  Appt with Dr. Cathren Harsh ENT 04/21/14 @ 10:30am [address] [place], [place] [address] [phone] phone   Fount Bahe, Salome Spotted

## 2014-04-19 NOTE — Telephone Encounter (Signed)
LMOVM again. Fleeger, Jessica Dawn  

## 2014-04-19 NOTE — Telephone Encounter (Signed)
Patient informed. 

## 2014-04-21 DIAGNOSIS — J3800 Paralysis of vocal cords and larynx, unspecified: Secondary | ICD-10-CM | POA: Diagnosis not present

## 2014-04-21 DIAGNOSIS — D447 Neoplasm of uncertain behavior of aortic body and other paraganglia: Secondary | ICD-10-CM | POA: Diagnosis not present

## 2014-04-21 DIAGNOSIS — R490 Dysphonia: Secondary | ICD-10-CM | POA: Diagnosis not present

## 2014-06-14 ENCOUNTER — Encounter: Payer: Self-pay | Admitting: Family Medicine

## 2014-06-14 NOTE — Progress Notes (Signed)
Patient dropped off handicapped placard form to be filled out.  Please mail it to her when completed.

## 2014-06-15 NOTE — Progress Notes (Signed)
Spoke with patient and informed her that forms are up front for pick up

## 2014-09-10 ENCOUNTER — Other Ambulatory Visit: Payer: Self-pay | Admitting: Family Medicine

## 2014-10-18 ENCOUNTER — Ambulatory Visit (INDEPENDENT_AMBULATORY_CARE_PROVIDER_SITE_OTHER): Payer: Medicare Other | Admitting: Family Medicine

## 2014-10-18 ENCOUNTER — Encounter: Payer: Self-pay | Admitting: Family Medicine

## 2014-10-18 VITALS — BP 155/95 | HR 68 | Temp 98.0°F | Wt 200.0 lb

## 2014-10-18 DIAGNOSIS — H524 Presbyopia: Secondary | ICD-10-CM | POA: Diagnosis not present

## 2014-10-18 DIAGNOSIS — R053 Chronic cough: Secondary | ICD-10-CM

## 2014-10-18 DIAGNOSIS — M546 Pain in thoracic spine: Secondary | ICD-10-CM

## 2014-10-18 DIAGNOSIS — R05 Cough: Secondary | ICD-10-CM | POA: Diagnosis not present

## 2014-10-18 DIAGNOSIS — M549 Dorsalgia, unspecified: Secondary | ICD-10-CM | POA: Insufficient documentation

## 2014-10-18 DIAGNOSIS — R059 Cough, unspecified: Secondary | ICD-10-CM | POA: Insufficient documentation

## 2014-10-18 DIAGNOSIS — Z961 Presence of intraocular lens: Secondary | ICD-10-CM | POA: Diagnosis not present

## 2014-10-18 MED ORDER — AZITHROMYCIN 250 MG PO TABS: ORAL_TABLET | ORAL | Status: DC

## 2014-10-18 MED ORDER — BENZONATATE 100 MG PO CAPS: 100.0000 mg | ORAL_CAPSULE | Freq: Two times a day (BID) | ORAL | Status: DC | PRN

## 2014-10-18 NOTE — Patient Instructions (Signed)
Acute Bronchitis Bronchitis is when the airways that extend from the windpipe into the lungs get red, puffy, and painful (inflamed). Bronchitis often causes thick spit (mucus) to develop. This leads to a cough. A cough is the most common symptom of bronchitis. In acute bronchitis, the condition usually begins suddenly and goes away over time (usually in 2 weeks). Smoking, allergies, and asthma can make bronchitis worse. Repeated episodes of bronchitis may cause more lung problems. HOME CARE  Rest.  Drink enough fluids to keep your pee (urine) clear or pale yellow (unless you need to limit fluids as told by your doctor).  Only take over-the-counter or prescription medicines as told by your doctor.  Avoid smoking and secondhand smoke. These can make bronchitis worse. If you are a smoker, think about using nicotine gum or skin patches. Quitting smoking will help your lungs heal faster.  Reduce the chance of getting bronchitis again by:  Washing your hands often.  Avoiding people with cold symptoms.  Trying not to touch your hands to your mouth, nose, or eyes.  Follow up with your doctor as told. GET HELP IF: Your symptoms do not improve after 1 week of treatment. Symptoms include:  Cough.  Fever.  Coughing up thick spit.  Body aches.  Chest congestion.  Chills.  Shortness of breath.  Sore throat. GET HELP RIGHT AWAY IF:   You have an increased fever.  You have chills.  You have severe shortness of breath.  You have bloody thick spit (sputum).  You throw up (vomit) often.  You lose too much body fluid (dehydration).  You have a severe headache.  You faint. MAKE SURE YOU:   Understand these instructions.  Will watch your condition.  Will get help right away if you are not doing well or get worse. Document Released: 03/31/2008 Document Revised: 06/15/2013 Document Reviewed: 04/05/2013 [employer] Patient Information 2015 [employer]. This information is not  intended to replace advice given to you by your health care provider. Make sure you discuss any questions you have with your health care provider.  

## 2014-10-18 NOTE — Assessment & Plan Note (Signed)
Acute on chronic cough. Hx of COPD and allergy on Advair. Continue Advair. Due to worsening cough and sputum production will cover with Zithromax. 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 Chest xray ordered. Tessalon prn cough. Return precaution given.

## 2014-10-18 NOTE — Assessment & Plan Note (Signed)
Feels like musculoskeletal pain. Ibuprofen recommended prn pain. Return precaution given.

## 2014-10-18 NOTE — Progress Notes (Signed)
Subjective:     Patient ID: Andrea Buck, female   DOB: [birth year], 68 y.o.   MRN: [id number]  HPI  Back pain: C/O left upper back pain since 3-4 days,pain is shocking nature and aggravated by taking deep breath or with moving certain way or with excessive coughing. Back pain is about 6-6/10 in severity, improves with breathing slowly or shallowly. Denies fever, she works around sick patient all the time. No recent trauma or injury to the back. She had similar episode in the past which resolved by itself in few days, this was about 3-4 months ago. Cough: C/O cough which worsened in the last few weeks. She stated due to her COPD and allergy she coughs at baseline, and has been coughing daily for 9 yrs since she had throat surgery. Cough usually worsens during winter season, this time she is producing a lot of sputum which is dark brown or at times yellowish, not blood stained, she uses her COPD medications regularly with no improvement of symptoms, denies fever,no SOB,she work around sick people everyday,theres is associated occasional wheezing. OTC cough medication did not help.  Current Outpatient Prescriptions on File Prior to Visit  Medication Sig Dispense Refill  . albuterol (PROVENTIL HFA;VENTOLIN HFA) 108 (90 BASE) MCG/ACT inhaler Inhale 2 puffs into the lungs every 6 (six) hours as needed for wheezing. 1 Inhaler 0  . busPIRone (BUSPAR) 5 MG tablet TAKE ONE & ONE-HALF TABLETS BY MOUTH TWICE DAILY 90 tablet 0  . calcium-vitamin D (SM CALCIUM-VITAMIN D) 500-200 MG-UNIT per tablet Take 1 tablet by mouth 2 (two) times daily. 60 tablet 6  . Fluticasone-Salmeterol (ADVAIR DISKUS) 250-50 MCG/DOSE AEPB Inhale 1 puff into the lungs 2 (two) times daily. 60 each 1  . gabapentin (NEURONTIN) 300 MG capsule Take 1 capsule (300 mg total) by mouth 3 (three) times daily. 90 capsule 3  . guaiFENesin-codeine 100-10 MG/5ML syrup Take 5 mLs by mouth every 6 (six) hours as needed for cough. 120 mL 0  .  hydrochlorothiazide (HYDRODIURIL) 25 MG tablet Take 1 tablet (25 mg total) by mouth daily. 30 tablet 3  . lisinopril (PRINIVIL,ZESTRIL) 10 MG tablet TAKE ONE TABLET BY MOUTH ONCE DAILY 30 tablet 0  . meloxicam (MOBIC) 7.5 MG tablet TAKE ONE TABLET BY MOUTH ONCE DAILY 30 tablet 0  . mometasone (NASONEX) 50 MCG/ACT nasal spray Place 1 spray into the nose daily. 17 g 3  . omeprazole (PRILOSEC) 20 MG capsule Take 1 capsule (20 mg total) by mouth 2 (two) times daily. 30 capsule 6  . sertraline (ZOLOFT) 100 MG tablet TAKE ONE TABLET BY MOUTH ONCE DAILY 30 tablet 0  . traZODone (DESYREL) 50 MG tablet Take 1 tablet (50 mg total) by mouth at bedtime. 30 tablet 3   Current Facility-Administered Medications on File Prior to Visit  Medication Dose Route Frequency Provider Last Rate Last Dose  . pneumococcal 23 valent vaccine (PNU-IMMUNE) injection 0.5 mL  0.5 mL Intramuscular Once Delrae Rend, MD       Past Medical History  Diagnosis Date  . Depression   . Blood transfusion     after hysterectomy  . Carpal tunnel syndrome 12/24/2006    Qualifier: Diagnosis of  By: Shelbie Proctor    . CHOLELITHIASIS 04/25/2009    Qualifier: Diagnosis of  By: Genene Churn MD, Janett Billow    . NECK MASS 12/29/2006    Qualifier: Diagnosis of  By: Laurance Flatten CMA, [place]    . GLOMUS TUMOR 2001  Qualifier: Diagnosis of  By: Genene Churn MD, Janett Billow    . Vocal cord paralysis 05/26/2011    RIGHT vocal cord parlaysis Seen by Dr,. Pricilla Riffle at [facility].( May 03, 2011)  Placed on scopalamine patch and w f/u [facility] for [facility]   . GASTROESOPHAGEAL REFLUX, NO ESOPHAGITIS 12/24/2006    Qualifier: Diagnosis of  By: Shelbie Proctor        Review of Systems  Respiratory: Positive for cough and wheezing. Negative for shortness of breath.   Cardiovascular: Negative for palpitations and leg swelling.  Musculoskeletal:       Left upper back pain  All other systems reviewed and are negative.  Filed Vitals:   10/18/14 1611   BP: 155/95  Pulse: 68  Temp: 98 F (36.7 C)  TempSrc: Oral  Weight: 200 lb (90.719 kg)  SpO2: 97%       Objective:   Physical Exam  Constitutional: She appears well-developed. No distress.  Cardiovascular: Normal rate, regular rhythm and normal heart sounds.   No murmur heard. Pulmonary/Chest: Effort normal and breath sounds normal. No respiratory distress. She has no wheezes.  Musculoskeletal:       Arms: Neurological: She is alert.  Nursing note and vitals reviewed.      Assessment:     Back pain Chronic cough    Plan:     Check problem list.

## 2015-01-03 DIAGNOSIS — R1310 Dysphagia, unspecified: Secondary | ICD-10-CM | POA: Diagnosis not present

## 2015-01-03 DIAGNOSIS — J383 Other diseases of vocal cords: Secondary | ICD-10-CM | POA: Diagnosis not present

## 2015-01-03 DIAGNOSIS — R490 Dysphonia: Secondary | ICD-10-CM | POA: Diagnosis not present

## 2015-01-03 DIAGNOSIS — Z8709 Personal history of other diseases of the respiratory system: Secondary | ICD-10-CM | POA: Diagnosis not present

## 2015-01-03 DIAGNOSIS — J384 Edema of larynx: Secondary | ICD-10-CM | POA: Diagnosis not present

## 2015-01-10 DIAGNOSIS — R633 Feeding difficulties: Secondary | ICD-10-CM | POA: Diagnosis not present

## 2015-01-10 DIAGNOSIS — R1319 Other dysphagia: Secondary | ICD-10-CM | POA: Diagnosis not present

## 2015-01-15 ENCOUNTER — Other Ambulatory Visit: Payer: Self-pay | Admitting: Family Medicine

## 2015-02-02 DIAGNOSIS — Z5189 Encounter for other specified aftercare: Secondary | ICD-10-CM | POA: Diagnosis not present

## 2015-02-02 DIAGNOSIS — R1312 Dysphagia, oropharyngeal phase: Secondary | ICD-10-CM | POA: Diagnosis not present

## 2015-02-12 DIAGNOSIS — Z5189 Encounter for other specified aftercare: Secondary | ICD-10-CM | POA: Diagnosis not present

## 2015-02-12 DIAGNOSIS — R1312 Dysphagia, oropharyngeal phase: Secondary | ICD-10-CM | POA: Diagnosis not present

## 2015-02-23 DIAGNOSIS — K449 Diaphragmatic hernia without obstruction or gangrene: Secondary | ICD-10-CM | POA: Diagnosis not present

## 2015-02-23 DIAGNOSIS — R1319 Other dysphagia: Secondary | ICD-10-CM | POA: Diagnosis not present

## 2015-03-14 DIAGNOSIS — R1312 Dysphagia, oropharyngeal phase: Secondary | ICD-10-CM | POA: Diagnosis not present

## 2015-03-14 DIAGNOSIS — Z5189 Encounter for other specified aftercare: Secondary | ICD-10-CM | POA: Diagnosis not present

## 2015-04-18 DIAGNOSIS — R490 Dysphonia: Secondary | ICD-10-CM | POA: Diagnosis not present

## 2015-04-18 DIAGNOSIS — R1310 Dysphagia, unspecified: Secondary | ICD-10-CM | POA: Diagnosis not present

## 2015-04-18 DIAGNOSIS — J3801 Paralysis of vocal cords and larynx, unilateral: Secondary | ICD-10-CM | POA: Diagnosis not present

## 2015-04-18 DIAGNOSIS — Q394 Esophageal web: Secondary | ICD-10-CM | POA: Diagnosis not present

## 2015-04-18 DIAGNOSIS — K222 Esophageal obstruction: Secondary | ICD-10-CM | POA: Insufficient documentation

## 2015-05-07 ENCOUNTER — Other Ambulatory Visit: Payer: Self-pay | Admitting: Family Medicine

## 2015-05-07 DIAGNOSIS — I10 Essential (primary) hypertension: Secondary | ICD-10-CM

## 2015-05-07 DIAGNOSIS — F32A Depression, unspecified: Secondary | ICD-10-CM

## 2015-05-07 DIAGNOSIS — F4323 Adjustment disorder with mixed anxiety and depressed mood: Secondary | ICD-10-CM

## 2015-05-07 DIAGNOSIS — M1990 Unspecified osteoarthritis, unspecified site: Secondary | ICD-10-CM

## 2015-05-07 DIAGNOSIS — F329 Major depressive disorder, single episode, unspecified: Secondary | ICD-10-CM

## 2015-05-07 DIAGNOSIS — F411 Generalized anxiety disorder: Secondary | ICD-10-CM

## 2015-05-08 MED ORDER — BUSPIRONE HCL 5 MG PO TABS: 7.5000 mg | ORAL_TABLET | Freq: Two times a day (BID) | ORAL | Status: DC

## 2015-05-08 MED ORDER — LISINOPRIL 10 MG PO TABS: 10.0000 mg | ORAL_TABLET | Freq: Every day | ORAL | Status: DC

## 2015-05-08 MED ORDER — SERTRALINE HCL 100 MG PO TABS: 100.0000 mg | ORAL_TABLET | Freq: Every day | ORAL | Status: DC

## 2015-05-08 MED ORDER — MELOXICAM 7.5 MG PO TABS: 7.5000 mg | ORAL_TABLET | Freq: Every day | ORAL | Status: DC

## 2015-05-08 NOTE — Addendum Note (Signed)
Addended by: Wendee Beavers T on: 05/08/2015 08:00 PM   Modules accepted: Orders

## 2015-05-17 DIAGNOSIS — G902 Horner's syndrome: Secondary | ICD-10-CM | POA: Diagnosis not present

## 2015-05-17 DIAGNOSIS — I10 Essential (primary) hypertension: Secondary | ICD-10-CM | POA: Diagnosis not present

## 2015-05-17 DIAGNOSIS — R1312 Dysphagia, oropharyngeal phase: Secondary | ICD-10-CM | POA: Insufficient documentation

## 2015-05-17 DIAGNOSIS — R1310 Dysphagia, unspecified: Secondary | ICD-10-CM | POA: Diagnosis not present

## 2015-05-17 DIAGNOSIS — K219 Gastro-esophageal reflux disease without esophagitis: Secondary | ICD-10-CM | POA: Diagnosis not present

## 2015-05-17 DIAGNOSIS — J3800 Paralysis of vocal cords and larynx, unspecified: Secondary | ICD-10-CM | POA: Diagnosis not present

## 2015-05-17 DIAGNOSIS — H11829 Conjunctivochalasis, unspecified eye: Secondary | ICD-10-CM | POA: Diagnosis not present

## 2015-05-17 DIAGNOSIS — J3802 Paralysis of vocal cords and larynx, bilateral: Secondary | ICD-10-CM | POA: Diagnosis not present

## 2015-05-17 DIAGNOSIS — K222 Esophageal obstruction: Secondary | ICD-10-CM | POA: Diagnosis not present

## 2015-05-17 DIAGNOSIS — J3801 Paralysis of vocal cords and larynx, unilateral: Secondary | ICD-10-CM | POA: Diagnosis not present

## 2015-05-18 DIAGNOSIS — I10 Essential (primary) hypertension: Secondary | ICD-10-CM | POA: Diagnosis not present

## 2015-05-18 DIAGNOSIS — R1310 Dysphagia, unspecified: Secondary | ICD-10-CM | POA: Diagnosis not present

## 2015-05-18 DIAGNOSIS — J3800 Paralysis of vocal cords and larynx, unspecified: Secondary | ICD-10-CM | POA: Diagnosis not present

## 2015-05-18 DIAGNOSIS — K222 Esophageal obstruction: Secondary | ICD-10-CM | POA: Diagnosis not present

## 2015-05-18 DIAGNOSIS — H11829 Conjunctivochalasis, unspecified eye: Secondary | ICD-10-CM | POA: Diagnosis not present

## 2015-05-18 DIAGNOSIS — G902 Horner's syndrome: Secondary | ICD-10-CM | POA: Diagnosis not present

## 2015-06-15 ENCOUNTER — Encounter: Payer: Self-pay | Admitting: Student

## 2015-06-15 ENCOUNTER — Ambulatory Visit (INDEPENDENT_AMBULATORY_CARE_PROVIDER_SITE_OTHER): Payer: Medicare Other | Admitting: Student

## 2015-06-15 VITALS — BP 149/84 | HR 67 | Temp 98.4°F | Ht 65.0 in | Wt 202.5 lb

## 2015-06-15 DIAGNOSIS — Z0000 Encounter for general adult medical examination without abnormal findings: Secondary | ICD-10-CM

## 2015-06-15 DIAGNOSIS — R5382 Chronic fatigue, unspecified: Secondary | ICD-10-CM

## 2015-06-15 DIAGNOSIS — Z23 Encounter for immunization: Secondary | ICD-10-CM

## 2015-06-15 DIAGNOSIS — I10 Essential (primary) hypertension: Secondary | ICD-10-CM

## 2015-06-15 NOTE — Progress Notes (Signed)
   Subjective:    Patient ID: Andrea Buck, female    DOB: [birth year], 69 y.o.   MRN: [id number]  HPI Annual physical Blood pressure 149/84. Compliant with meds.  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 Reports normal Colonoscopy about 10 yrs ago. Complete hysterectomy at 26 yrs from bleeding. Has seen her dentist about three months ago. Reports some chronic pain unchanged from baseline. She also reports some fatigue. She has hx of depression and on Zoloft. She is concerned about her weight gain. She hasn't been as active as she would like to be. Recent surgery was the main barrier.  Works as Quarry manager. Denies smoking, drinking or recreational drug use.  Review of Systems Per HPI    Objective:   Physical Exam Gen: NAD, well-appearing  Oropharynx: clear, moist.  CV: RRR. S1 & S2 audible, no murmurs, rubs. 2+ radial and DP pulses bilaterally. Resp: no apparent WOB, CTAB. Abd: +BS. Soft, NDNT, no rebound or guarding.  Ext: No edema or gross deformities. Neuro: Alert and oriented, No gross focal deficits    Assessment & Plan:  Preventive health:  Colonoscopy, Mammogram & Bone scan: pt to call and set an apt. Gave phone numbers and addresses.  PCV13: received here  Will give her Rx for Zostavax and TDAP  Hypertension: well-controlled. Compliant with meds.  Bmet  Lipid panel  Fatigue: not acute. This is also in the setting of depression/stresors  TSH today  Pt to schedule another apt to discuss depression/fatigue

## 2015-06-15 NOTE — Patient Instructions (Signed)
It was great seeing you today! We have addressed the following issues today  1. Annual physical: 1. Colonoscopy: call and schedule an appointment 2. Mammogram: call and schedule an appointment 3. Tetanus shot: at pharmacy 4. Pneumonia vaccine: here 2. Fatigue/Depression: I would like you to schedule another appointment as soon as possible to address this.   If we did any lab work today, and the results require attention, either me or my nurse will get in touch with you. Otherwise, I look forward to talking with you again at our next visit. If you have any questions or concerns before then, please call the clinic at [phone].  Please bring all your medications to every doctors visit   Sign up for My Chart to have easy access to your labs results, and communication with your Primary care physician.    Please check-out at the front desk before leaving the clinic.   Take Care,   Dr. Wendee Beavers

## 2015-06-16 LAB — BASIC METABOLIC PANEL
BUN: 16 mg/dL (ref 7–25)
CHLORIDE: 101 mmol/L (ref 98–110)
CO2: 29 mmol/L (ref 20–31)
CREATININE: 0.69 mg/dL (ref 0.50–0.99)
Calcium: 9.3 mg/dL (ref 8.6–10.4)
Glucose, Bld: 97 mg/dL (ref 65–99)
POTASSIUM: 4.2 mmol/L (ref 3.5–5.3)
Sodium: 140 mmol/L (ref 135–146)

## 2015-06-16 LAB — TSH: TSH: 1.173 u[IU]/mL (ref 0.350–4.500)

## 2015-06-16 LAB — LIPID PANEL
CHOL/HDL RATIO: 4.2 ratio (ref <=5.0)
CHOLESTEROL: 202 mg/dL — AB (ref 125–200)
HDL: 48 mg/dL (ref >=46)
LDL Cholesterol: 124 mg/dL (ref <130)
TRIGLYCERIDES: 150 mg/dL — AB (ref <150)
VLDL: 30 mg/dL (ref <30)

## 2015-06-18 ENCOUNTER — Telehealth: Payer: Self-pay | Admitting: Student

## 2015-06-18 DIAGNOSIS — E785 Hyperlipidemia, unspecified: Secondary | ICD-10-CM | POA: Insufficient documentation

## 2015-06-18 MED ORDER — ATORVASTATIN CALCIUM 40 MG PO TABS: 40.0000 mg | ORAL_TABLET | Freq: Every day | ORAL | Status: DC

## 2015-06-18 NOTE — Addendum Note (Signed)
Addended by: Wendee Beavers T on: 06/18/2015 11:48 AM   Modules accepted: Miquel Dunn

## 2015-06-18 NOTE — Assessment & Plan Note (Signed)
ASCVD risk score of 15%. Discussed about the result with a patient over the phone.  -Sent in a prescription for Atorvastatin 40 mg daily -Will assess tolerability on follow up on 09/0/06/2015

## 2015-06-18 NOTE — Telephone Encounter (Signed)
Called a pt to discuss about her labs, particularly about lipid panel. Patient has ASCVD risk score of about 15%. She states that she never been on any cholesterol medication in the past. We have agreed to start Atorvastatin 40 mg daily. I have sent her prescription to her pharmacy. Will assess tolerance on 07/06/2015 when she comes back for her follow up.

## 2015-06-20 DIAGNOSIS — Z8709 Personal history of other diseases of the respiratory system: Secondary | ICD-10-CM | POA: Diagnosis not present

## 2015-06-20 DIAGNOSIS — R490 Dysphonia: Secondary | ICD-10-CM | POA: Diagnosis not present

## 2015-06-20 DIAGNOSIS — R1310 Dysphagia, unspecified: Secondary | ICD-10-CM | POA: Diagnosis not present

## 2015-06-20 DIAGNOSIS — Z4889 Encounter for other specified surgical aftercare: Secondary | ICD-10-CM | POA: Diagnosis not present

## 2015-07-06 ENCOUNTER — Ambulatory Visit: Payer: Medicare Other | Admitting: Student

## 2015-07-24 ENCOUNTER — Other Ambulatory Visit: Payer: Self-pay

## 2015-07-24 DIAGNOSIS — Z1231 Encounter for screening mammogram for malignant neoplasm of breast: Secondary | ICD-10-CM

## 2015-08-15 ENCOUNTER — Ambulatory Visit: Payer: Medicare Other

## 2015-09-03 ENCOUNTER — Other Ambulatory Visit: Payer: Self-pay | Admitting: Family Medicine

## 2015-09-03 ENCOUNTER — Other Ambulatory Visit: Payer: Self-pay | Admitting: *Deleted

## 2015-09-03 DIAGNOSIS — M549 Dorsalgia, unspecified: Secondary | ICD-10-CM

## 2015-09-03 MED ORDER — MELOXICAM 7.5 MG PO TABS: 7.5000 mg | ORAL_TABLET | Freq: Every day | ORAL | Status: DC

## 2015-09-12 ENCOUNTER — Ambulatory Visit
Admission: RE | Admit: 2015-09-12 | Discharge: 2015-09-12 | Disposition: A | Discharge status: Home / Self Care | Payer: Medicare Other | Source: Ambulatory Visit

## 2015-09-12 DIAGNOSIS — Z1231 Encounter for screening mammogram for malignant neoplasm of breast: Secondary | ICD-10-CM

## 2015-09-19 DIAGNOSIS — R471 Dysarthria and anarthria: Secondary | ICD-10-CM | POA: Diagnosis not present

## 2015-09-19 DIAGNOSIS — J384 Edema of larynx: Secondary | ICD-10-CM | POA: Diagnosis not present

## 2015-09-19 DIAGNOSIS — R1310 Dysphagia, unspecified: Secondary | ICD-10-CM | POA: Diagnosis not present

## 2015-09-19 DIAGNOSIS — J3801 Paralysis of vocal cords and larynx, unilateral: Secondary | ICD-10-CM | POA: Diagnosis not present

## 2015-09-19 DIAGNOSIS — J387 Other diseases of larynx: Secondary | ICD-10-CM | POA: Diagnosis not present

## 2015-09-19 DIAGNOSIS — R490 Dysphonia: Secondary | ICD-10-CM | POA: Diagnosis not present

## 2015-10-10 DIAGNOSIS — H524 Presbyopia: Secondary | ICD-10-CM | POA: Diagnosis not present

## 2015-10-10 DIAGNOSIS — Z961 Presence of intraocular lens: Secondary | ICD-10-CM | POA: Diagnosis not present

## 2015-10-10 DIAGNOSIS — H43813 Vitreous degeneration, bilateral: Secondary | ICD-10-CM | POA: Diagnosis not present

## 2015-10-15 DIAGNOSIS — Z961 Presence of intraocular lens: Secondary | ICD-10-CM | POA: Insufficient documentation

## 2015-10-15 DIAGNOSIS — H43813 Vitreous degeneration, bilateral: Secondary | ICD-10-CM | POA: Insufficient documentation

## 2015-10-23 ENCOUNTER — Encounter: Payer: Self-pay | Admitting: Student

## 2015-10-23 ENCOUNTER — Ambulatory Visit (INDEPENDENT_AMBULATORY_CARE_PROVIDER_SITE_OTHER): Payer: Medicare Other | Admitting: Student

## 2015-10-23 VITALS — BP 153/80 | HR 72 | Temp 98.3°F | Ht 65.0 in | Wt 200.0 lb

## 2015-10-23 DIAGNOSIS — Z23 Encounter for immunization: Secondary | ICD-10-CM | POA: Diagnosis present

## 2015-10-23 DIAGNOSIS — M25561 Pain in right knee: Secondary | ICD-10-CM | POA: Diagnosis not present

## 2015-10-23 DIAGNOSIS — I10 Essential (primary) hypertension: Secondary | ICD-10-CM

## 2015-10-23 DIAGNOSIS — M546 Pain in thoracic spine: Secondary | ICD-10-CM

## 2015-10-23 MED ORDER — LOSARTAN POTASSIUM 25 MG PO TABS: 25.0000 mg | ORAL_TABLET | Freq: Every day | ORAL | Status: DC

## 2015-10-23 MED ORDER — GABAPENTIN 300 MG PO CAPS: 300.0000 mg | ORAL_CAPSULE | Freq: Three times a day (TID) | ORAL | Status: DC

## 2015-10-23 NOTE — Patient Instructions (Signed)
It was great seeing you today! We have addressed the following issues today  1. Back pain: this is likely from pulling muscle. Mild to moderate exercise such as stretching could help. I expect to resolve in about a month. If there is no improvement or your pain gets worse, please give me a call.  2. Knee pain: this is likely nerve pain related to you back. I have sent a prescription for gabapentin to your pharmacy.   If we did any lab work today, and the results require attention, either me or my nurse will get in touch with you. If everything is normal, you will get a letter in mail. If you don't hear from [place] in two weeks, please give [place] a call. Otherwise, I look forward to talking with you again at our next visit. If you have any questions or concerns before then, please call the clinic at [phone].  Please bring all your medications to every doctors visit   Sign up for My Chart to have easy access to your labs results, and communication with your Primary care physician.    Please check-out at the front desk before leaving the clinic.   Take Care,

## 2015-10-23 NOTE — Progress Notes (Signed)
   Subjective:    Patient ID: Andrea Buck, female    DOB: 1945-11-19, 69 y.o.   MRN: [id number]  HPI Back pain: for about a month. Pressure like. Comes and goes. Pain lasts about 2 minutes. Provoked with moving. Always on the right side. Pain 7-8 out of 10 at its worst. 3/10 right now. Alleviated by being still. Tylenol helps as well. Overall, pain is getting better especially over the last two weeks. She is concerned that this could be upper respiratory infection or pneumonia. She has cough that has not changed from baseline. She has history of copd. Denies trauma, abdominal pain, fever, shortness of breath, skin rash. No association with meal. Different from the back pain she had in the past.   Right knee pain: nagging pain. This has been going on for two weeks. Worse with starting movement especially when she gets up from her sitting position. She bumped her knee to the base of the steering wheel about three weeks ago. She felt a lot of pain at that time. . Pain radiates down to the bottom of her foot medially. She has history of back pain. Denies fever, night sweat or incontinence. She was on gabapentin in the past. She didn't take it recently because she was told by the pharmacy that she has no such prescription.  Hypertension: BP 153/80. She has been taking her hydrochlorothiazide. She is taking lisinopril but thinks it may be causing her cough. Review of Systems Per HPI    Objective:   Physical Exam Gen: appears well, no acute distress CV: regular rate and rythm. Resp: no apparent work of breathing, clear to auscultation bilaterally. GU: no suprapubic tenderness, no CVA Skin: no lesion MSK:  Back:  tenderness over right mid back, no skin changes or rash. Full range of motion in her shoulder. No pain with flexion or extension.  Knee: symmetric. Right knee with no swelling, skin discoloration or point tenderness. Crepitus bilaterally. Full range of motion in both knees. Negative varus  and valgus.       Assessment & Plan:  Back pain Right sides low thoracic back pain likely MSK. Pain generally improving. Reassured patient about her concern for respiratory infection. Recommended some stretching exercise. She has as needed Meloxicam. Gave return precaution. Anticipate pain to resolve in about a month. Will consider imaging if it gets worse or not improving.  Right knee pain Pain after getting up from sitting that improves with walking is suggestive for osteoarthritis. She also has crepitus bilaterally. However, her description of pain as numbness that radiates medially to the bottom of her right foot suggests radiculopathy. She has history of back pain. She was on gabapentin in the past that she hasn't been taking recently. She was told she has no prescription for it by the pharmacy. She has no red flags. Refilled gabapentin today. This along with Mobic should help with the pain.  HYPERTENSION, BENIGN SYSTEMIC BP 153/80 today. This is in the setting of not taking her hctz. She is also worried that lisinopril might be worsening her cough. -Sent prescription for losartan 25 mg daily #90 with 1 refill. -Discontinued her lisinopril -Encouraged her to take her hctz

## 2015-10-23 NOTE — Assessment & Plan Note (Signed)
BP 153/80 today. This is in the setting of not taking her hctz. She is also worried that lisinopril might be worsening her cough. -Sent prescription for losartan 25 mg daily #90 with 1 refill. -Discontinued her lisinopril -Encouraged her to take her hctz

## 2015-10-23 NOTE — Assessment & Plan Note (Signed)
Pain after getting up from sitting that improves with walking is suggestive for osteoarthritis. She also has crepitus bilaterally. However, her description of pain as numbness that radiates medially to the bottom of her right foot suggests radiculopathy. She has history of back pain. She was on gabapentin in the past that she hasn't been taking recently. She was told she has no prescription for it by the pharmacy. She has no red flags. Refilled gabapentin today. This along with Mobic should help with the pain.

## 2015-10-23 NOTE — Assessment & Plan Note (Addendum)
Right sides low thoracic back pain likely MSK. Pain generally improving. Reassured patient about her concern for respiratory infection. Recommended some stretching exercise. She has as needed Meloxicam. Gave return precaution. Anticipate pain to resolve in about a month. Will consider imaging if it gets worse or not improving.

## 2015-11-14 ENCOUNTER — Other Ambulatory Visit: Payer: Self-pay | Admitting: Student

## 2015-12-26 ENCOUNTER — Other Ambulatory Visit: Payer: Self-pay | Admitting: Student

## 2015-12-26 DIAGNOSIS — E785 Hyperlipidemia, unspecified: Secondary | ICD-10-CM

## 2016-02-14 ENCOUNTER — Encounter: Payer: Self-pay | Admitting: Family Medicine

## 2016-02-14 ENCOUNTER — Ambulatory Visit (INDEPENDENT_AMBULATORY_CARE_PROVIDER_SITE_OTHER): Payer: Medicare Other | Admitting: Family Medicine

## 2016-02-14 VITALS — BP 133/94 | HR 73 | Temp 99.3°F | Wt 206.8 lb

## 2016-02-14 DIAGNOSIS — R05 Cough: Secondary | ICD-10-CM | POA: Diagnosis not present

## 2016-02-14 DIAGNOSIS — R059 Cough, unspecified: Secondary | ICD-10-CM

## 2016-02-14 MED ORDER — GUAIFENESIN-CODEINE 100-10 MG/5ML PO SOLN: 5.0000 mL | Freq: Four times a day (QID) | ORAL | Status: DC | PRN

## 2016-02-14 MED ORDER — CETIRIZINE HCL 10 MG PO TABS: 10.0000 mg | ORAL_TABLET | Freq: Every day | ORAL | Status: DC

## 2016-02-14 MED ORDER — ALBUTEROL SULFATE HFA 108 (90 BASE) MCG/ACT IN AERS: 2.0000 | INHALATION_SPRAY | Freq: Four times a day (QID) | RESPIRATORY_TRACT | Status: DC | PRN

## 2016-02-14 NOTE — Assessment & Plan Note (Signed)
Most likely associated with a URI  Doubtful for PNA or strep throat  - advised to take afrin for 3 days  - encouraged to take albuterol prior to going to bed  - continue nasonex  - use honey and lozenges for the cough  - refilled guaifenesin-codeine.

## 2016-02-14 NOTE — Patient Instructions (Signed)
Thank you for coming in,   I would continue taking the nasonex.   I have sent in zyrtec and albuterol. I would use the zyrtec daily and the albuterol before going to bed.   You can also pick up afrin that will help with the nasal congestion.  You can use a spoonful of honey and lozenges that will help with a cough.   Please bring all of your medications with you to each visit.   Sign up for My Chart to have easy access to your labs results, and communication with your Primary care physician   Please feel free to call with any questions or concerns at any time, at [phone]. --Dr. Raeford Razor

## 2016-02-14 NOTE — Progress Notes (Signed)
   Subjective:    Patient ID: Andrea Buck, female    DOB: 05-Jan-1946, 70 y.o.   MRN: [id number]  Seen for Same day visit for   CC: cough  Has been coughing since last week. Cough is: worse at night  Sputum production: some  Medications tried: cough syrup  She has itchy watery eyes.  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  Does have seasonal allergies.   Symptoms Runny nose: yes  Mucous in back of throat: yes  Throat burning or reflux: no Wheezing or asthma: no Fever: no Chest Pain: no Shortness of breath: no Leg swelling: no Hemoptysis: no Weight loss: no  SH: no tobacco or alcohol  PMH: HTN, dyslipidemia,   Review of Systems   See HPI for ROS. Objective:  BP 133/94 mmHg  Pulse 73  Temp(Src) 99.3 F (37.4 C) (Oral)  Wt 206 lb 12.8 oz (93.804 kg)  General: NAD HEENT:  Tympanic membranes clear and intact bilaterally, clear conjunctiva,no cervical lymphadenopathy, boggy turbinates bilaterally, nasal congestion, no tonsillar exudates, moist mucous membranes, uvula midline, Cardiac: RRR, normal heart sounds, no murmurs.  Respiratory: CTAB, normal effort Extremities: . WWP. Skin: warm and dry, no rashes noted     Assessment & Plan:   Cough Most likely associated with a URI  Doubtful for PNA or strep throat  - advised to take afrin for 3 days  - encouraged to take albuterol prior to going to bed  - continue nasonex  - use honey and lozenges for the cough  - refilled guaifenesin-codeine.

## 2016-03-12 ENCOUNTER — Other Ambulatory Visit: Payer: Self-pay | Admitting: Student

## 2016-03-19 ENCOUNTER — Other Ambulatory Visit: Payer: Self-pay | Admitting: *Deleted

## 2016-03-19 DIAGNOSIS — M792 Neuralgia and neuritis, unspecified: Secondary | ICD-10-CM

## 2016-03-19 NOTE — Telephone Encounter (Signed)
Pt wants to know if she can get a 3 month supply on the mobic. Deseree Kennon Holter, CMA

## 2016-03-21 MED ORDER — GABAPENTIN 300 MG PO CAPS: 300.0000 mg | ORAL_CAPSULE | Freq: Three times a day (TID) | ORAL | Status: DC

## 2016-07-02 ENCOUNTER — Other Ambulatory Visit: Payer: Self-pay | Admitting: Student

## 2016-07-02 DIAGNOSIS — M129 Arthropathy, unspecified: Secondary | ICD-10-CM

## 2016-07-02 DIAGNOSIS — F32A Depression, unspecified: Secondary | ICD-10-CM

## 2016-07-02 DIAGNOSIS — F329 Major depressive disorder, single episode, unspecified: Secondary | ICD-10-CM

## 2016-07-09 DIAGNOSIS — Z23 Encounter for immunization: Secondary | ICD-10-CM | POA: Diagnosis not present

## 2016-09-12 ENCOUNTER — Telehealth: Payer: Self-pay | Admitting: Student

## 2016-09-12 NOTE — Telephone Encounter (Signed)
No answer. Left a message asking to call back.

## 2016-10-14 ENCOUNTER — Other Ambulatory Visit: Payer: Self-pay | Admitting: Student

## 2016-10-14 DIAGNOSIS — M129 Arthropathy, unspecified: Secondary | ICD-10-CM

## 2016-10-29 ENCOUNTER — Encounter: Payer: Self-pay | Admitting: *Deleted

## 2016-10-29 ENCOUNTER — Ambulatory Visit (INDEPENDENT_AMBULATORY_CARE_PROVIDER_SITE_OTHER): Payer: Medicare Other | Admitting: *Deleted

## 2016-10-29 VITALS — BP 140/72 | HR 63 | Temp 98.0°F | Ht 65.0 in | Wt 203.8 lb

## 2016-10-29 DIAGNOSIS — Z0000 Encounter for general adult medical examination without abnormal findings: Secondary | ICD-10-CM | POA: Diagnosis not present

## 2016-10-29 DIAGNOSIS — Z1159 Encounter for screening for other viral diseases: Secondary | ICD-10-CM

## 2016-10-29 DIAGNOSIS — E785 Hyperlipidemia, unspecified: Secondary | ICD-10-CM | POA: Diagnosis not present

## 2016-10-29 DIAGNOSIS — I10 Essential (primary) hypertension: Secondary | ICD-10-CM | POA: Diagnosis not present

## 2016-10-29 NOTE — Progress Notes (Signed)
Subjective:   Andrea Buck is a 71 y.o. female who presents for Medicare Annual (Subsequent) preventive examination.  Cardiac Risk Factors include: advanced age (>32mn, >>81women);dyslipidemia;hypertension;obesity (BMI >30kg/m2);sedentary lifestyle     Objective:     Vitals: BP 140/72 (BP Location: Left Arm, Patient Position: Sitting, Cuff Size: Normal)   Pulse 63   Temp 98 F (36.7 C)   Ht _0  (1.651 m)   Wt 203 lb 12.8 oz (92.4 kg)   SpO2 99%   BMI 33.91 kg/m   Body mass index is 33.91 kg/m.   Tobacco History  Smoking Status  . Never Smoker  Smokeless Tobacco  . Never Used     Patient has never smoked and has no plans to start.   Past Medical History:  Diagnosis Date  . Blood transfusion    after hysterectomy  . [place]  . Carpal tunnel syndrome 12/24/2006   Qualifier: Diagnosis of  By: KShelbie Proctor   . CHOLELITHIASIS 04/25/2009   Qualifier: Diagnosis of  By: TGenene ChurnMD, JJanett Billow   . Depression   . GASTROESOPHAGEAL REFLUX, NO ESOPHAGITIS 12/24/2006   Qualifier: Diagnosis of  By: KShelbie Proctor   . GLOMUS TUMOR 2001   Qualifier: Diagnosis of  By: TGenene ChurnMD, JJanett Billow   . Hyperlipidemia   . Hypertension   . NECK MASS 12/29/2006   Qualifier: Diagnosis of  By: MLaurance FlattenCMA, [place]    . Vocal cord paralysis 05/26/2011   RIGHT vocal cord parlaysis [facility]  [facility]amine patch [facility]    Past Surgical History:  Procedure Laterality Date  . ABDOMINAL HYSTERECTOMY    . CARPAL TUNNEL RELEASE    . CHOLECYSTECTOMY    . LUMBAR LAMINECTOMY     (4-5)  . palatal adhesion    . removal of glomus vagales tumor-non cancerious    . TUBAL LIGATION     Family History  Problem Relation Age of Onset  . Cancer Mother     Uterus  . Diabetes Mother   . Hypertension Mother   . Heart disease Father   . Stroke Father   . Hypertension Father   .  Diabetes Father   . Diabetes Sister   . Hypertension Sister   . Diabetes Brother   . Mental illness Son     PTSD  . Diabetes Son   . Hypertension Son   . Hyperlipidemia Son   . Obesity Son   . Drug abuse Son   . Diabetes Sister   . Hypertension Sister   . Diabetes Sister   . Hypertension Sister   . Multiple sclerosis Brother   . Diabetes Brother   . Hypertension Brother   . Arthritis Son   . Drug abuse Son   . Hyperlipidemia Son    History  Sexual Activity  . Sexual activity: Yes  . Birth control/ protection: Post-menopausal    Comment: Hysterectomy    Outpatient Encounter Prescriptions as of 10/29/2016  Medication Sig  . albuterol (PROVENTIL HFA;VENTOLIN HFA) 108 (90 Base) MCG/ACT inhaler Inhale 2 puffs into the lungs every 6 (six) hours as needed for wheezing.  .Marland Kitchenatorvastatin (LIPITOR) 40 MG tablet TAKE ONE TABLET BY MOUTH ONCE DAILY  . busPIRone (BUSPAR) 5 MG tablet Take 1.5 tablets (7.5 mg total) by mouth 2 (two) times daily.  .Marland Kitchen  Fluticasone-Salmeterol (ADVAIR DISKUS) 250-50 MCG/DOSE AEPB Inhale 1 puff into the lungs 2 (two) times daily.  Marland Kitchen gabapentin (NEURONTIN) 300 MG capsule Take 1 capsule (300 mg total) by mouth 3 (three) times daily.  Marland Kitchen losartan (COZAAR) 25 MG tablet Take 1 tablet (25 mg total) by mouth daily.  . meloxicam (MOBIC) 7.5 MG tablet TAKE ONE TABLET BY MOUTH ONCE DAILY  . sertraline (ZOLOFT) 100 MG tablet TAKE ONE TABLET BY MOUTH ONCE DAILY  . calcium-vitamin D (SM CALCIUM-VITAMIN D) 500-200 MG-UNIT per tablet Take 1 tablet by mouth 2 (two) times daily. (Patient not taking: Reported on 10/29/2016)  . cetirizine (ZYRTEC) 10 MG tablet Take 1 tablet (10 mg total) by mouth daily. (Patient not taking: Reported on 10/29/2016)  . hydrochlorothiazide (HYDRODIURIL) 25 MG tablet Take 1 tablet (25 mg total) by mouth daily. (Patient not taking: Reported on 10/29/2016)  . mometasone (NASONEX) 50 MCG/ACT nasal spray Place 1 spray into the nose daily. (Patient not taking:  Reported on 10/29/2016)  . omeprazole (PRILOSEC) 20 MG capsule Take 1 capsule (20 mg total) by mouth 2 (two) times daily. (Patient not taking: Reported on 10/29/2016)  . traZODone (DESYREL) 50 MG tablet Take 1 tablet (50 mg total) by mouth at bedtime. (Patient not taking: Reported on 10/29/2016)  . [DISCONTINUED] benzonatate (TESSALON) 100 MG capsule Take 1 capsule (100 mg total) by mouth 2 (two) times daily as needed for cough.   Facility-Administered Encounter Medications as of 10/29/2016  Medication  . pneumococcal 23 valent vaccine (PNU-IMMUNE) injection 0.5 mL    Activities of Daily Living In your present state of health, do you have any difficulty performing the following activities: 10/29/2016  Hearing? N  Vision? Y  Difficulty concentrating or making decisions? Y  Walking or climbing stairs? Y  Dressing or bathing? N  Doing errands, shopping? N  Preparing Food and eating ? N  Using the Toilet? N  In the past six months, have you accidently leaked urine? Y  Do you have problems with loss of bowel control? Y  Managing your Medications? Y  Managing your Finances? Y  Housekeeping or managing your Housekeeping? N  Some recent data might be hidden  Home Safety:  My home has a working smoke alarm:  Yes X 1           My home throw rugs have been fastened down to the floor or removed:  Fastened down I have non-slip mats in the bathtub and shower:  Yes         All my home's stairs have railings or bannisters: Two level home with railings inside and out           My home's floors, stairs and hallways are free from clutter, wires and cords:  Yes, in walking paths     I wear seatbelts consistently:  Yes    Patient Care Team: Mercy Riding, MD as PCP - General Ernestine Conrad, MD (Otolaryngology)   Assessment:     Exercise Activities and Dietary recommendations Current Exercise Habits: Home exercise routine (Discussed using Silver Sneakers), Type of exercise: walking, Time (Minutes):  45, Frequency (Times/Week): 1, Weekly Exercise (Minutes/Week): 45, Intensity: Moderate, Exercise limited by: respiratory conditions(s);orthopedic condition(s)  Goals    . Exercise 3x per week (30 min per time)    . Increase water intake    . Weight < 177 lb (80.287 kg)      Fall Risk Fall Risk  10/29/2016 10/23/2015 04/05/2014 09/02/2013 11/01/2012  Falls  in the past year? _0   Risk for fall due to : Impaired mobility - - - -   TUG Test:  Done in 13 seconds. Patient used one hand to sit back down only.  Cognitive Function: Mini-Cog  Passed with score 4/5   Depression Screen PHQ 2/9 Scores 10/29/2016 10/23/2015 10/18/2014 04/05/2014  PHQ - 2 Score 5 0 6 2  PHQ- 9 Score 15 - - -  Patient met with [facility] Doffing immediately following our visit for safety issues as well as PHQ-9 score of 15. PCP notified.  Cognitive Function MMSE - Mini Mental State Exam 06/24/2012 02/21/2011  Orientation to time 5 5  Orientation to Place 5 5  Registration 3 3  Attention/ Calculation 2 5  Recall 3 3  Language- name 2 objects 2 2  Language- repeat 1 1  Language- follow 3 step command 3 3  Language- read & follow direction 1 1  Write a sentence 1 1  Copy design 1 1  Total score 27 30       Immunization History  Administered Date(s) Administered  . Influenza,inj,Quad PF,36+ Mos 07/21/2013, 10/23/2015  . Pneumococcal Conjugate-13 06/15/2015  . Pneumococcal Polysaccharide-23 02/21/2011  . Td 05/28/1999  Received Flu vaccine at [facility] end of September. Added to historical immunizations.  Screening Tests Health Maintenance  Topic Date Due  . Hepatitis C Screening  Aug 31, 1946  . COLONOSCOPY  09/03/1995  . ZOSTAVAX  09/02/2005  . TETANUS/TDAP  05/27/2009  . DEXA SCAN  09/02/2010  . INFLUENZA VACCINE  05/27/2016  . MAMMOGRAM  09/11/2017  . PNA vac Low Risk Adult  Completed  Hep C, lipid panel and CMP drawn today Patient will contact [facility] for  colonoscopy Discussed receiving Shingrix when available Patient will obtain TDaP at local pharmacy [facility] and mammogram. Contact info given.       Plan:   Hep C, lipid panel and CMP drawn today Patient will contact [facility] for colonoscopy Discussed receiving Shingrix when available Patient will obtain TDaP at local pharmacy Patient will contact the [facility] to schedule Dexa Scan and mammogram. Contact info given.  Patient has f/u scheduled next week with PCP for right knee pain  Requesting refills on zyrtec, nasonex and omeprazole as well as HCTZ (patient has been out of these meds for greater than 2 months)  During the course of the visit the patient was educated and counseled about the following appropriate screening and preventive services:   Vaccines to include Pneumoccal, Influenza, Td, Zostavax  Cardiovascular Disease  Colorectal cancer screening  Bone density screening  Diabetes screening  Mammography/PAP  Nutrition counseling   Patient Instructions (the written plan) was given to the patient.   Velora Heckler, RN  10/29/2016

## 2016-10-29 NOTE — Patient Instructions (Signed)
Health Maintenance for Postmenopausal Women Introduction Menopause is a normal process in which your reproductive ability comes to an end. This process happens gradually over a span of months to years, usually between the ages of 20 and 50. Menopause is complete when you have missed 12 consecutive menstrual periods. It is important to talk with your health care provider about some of the most common conditions that affect postmenopausal women, such as heart disease, cancer, and bone loss (osteoporosis). Adopting a healthy lifestyle and getting preventive care can help to promote your health and wellness. Those actions can also lower your chances of developing some of these common conditions. What should I know about menopause? During menopause, you may experience a number of symptoms, such as:  Moderate-to-severe hot flashes.  Night sweats.  Decrease in sex drive.  Mood swings.  Headaches.  Tiredness.  Irritability.  Memory problems.  Insomnia. Choosing to treat or not to treat menopausal changes is an individual decision that you make with your health care provider. What should I know about hormone replacement therapy and supplements? Hormone therapy products are effective for treating symptoms that are associated with menopause, such as hot flashes and night sweats. Hormone replacement carries certain risks, especially as you become older. If you are thinking about using estrogen or estrogen with progestin treatments, discuss the benefits and risks with your health care provider. What should I know about heart disease and stroke? Heart disease, heart attack, and stroke become more likely as you age. This may be due, in part, to the hormonal changes that your body experiences during menopause. These can affect how your body processes dietary fats, triglycerides, and cholesterol. Heart attack and stroke are both medical emergencies. There are many things that you can do to help prevent  heart disease and stroke:  Have your blood pressure checked at least every 1-2 years. High blood pressure causes heart disease and increases the risk of stroke.  If you are 49-18 years old, ask your health care provider if you should take aspirin to prevent a heart attack or a stroke.  Do not use any tobacco products, including cigarettes, chewing tobacco, or electronic cigarettes. If you need help quitting, ask your health care provider.  It is important to eat a healthy diet and maintain a healthy weight.  Be sure to include plenty of vegetables, fruits, low-fat dairy products, and lean protein.  Avoid eating foods that are high in solid fats, added sugars, or salt (sodium).  Get regular exercise. This is one of the most important things that you can do for your health.  Try to exercise for at least 150 minutes each week. The type of exercise that you do should increase your heart rate and make you sweat. This is known as moderate-intensity exercise.  Try to do strengthening exercises at least twice each week. Do these in addition to the moderate-intensity exercise.  Know your numbers.Ask your health care provider to check your cholesterol and your blood glucose. Continue to have your blood tested as directed by your health care provider. What should I know about cancer screening? There are several types of cancer. Take the following steps to reduce your risk and to catch any cancer development as early as possible. Breast Cancer  Practice breast self-awareness.  This means understanding how your breasts normally appear and feel.  It also means doing regular breast self-exams. Let your health care provider know about any changes, no matter how small.  If you are 40 or  older, have a clinician do a breast exam (clinical breast exam or CBE) every year. Depending on your age, family history, and medical history, it may be recommended that you also have a yearly breast X-ray  (mammogram).  If you have a family history of breast cancer, talk with your health care provider about genetic screening.  If you are at high risk for breast cancer, talk with your health care provider about having an MRI and a mammogram every year.  Breast cancer (BRCA) gene test is recommended for women who have family members with BRCA-related cancers. Results of the assessment will determine the need for genetic counseling and BRCA1 and for BRCA2 testing. BRCA-related cancers include these types:  Breast. This occurs in males or females.  Ovarian.  Tubal. This may also be called fallopian tube cancer.  Cancer of the abdominal or pelvic lining (peritoneal cancer).  Prostate.  Pancreatic. Cervical, Uterine, and Ovarian Cancer  Your health care provider may recommend that you be screened regularly for cancer of the pelvic organs. These include your ovaries, uterus, and vagina. This screening involves a pelvic exam, which includes checking for microscopic changes to the surface of your cervix (Pap test).  For women ages 21-65, health care providers may recommend a pelvic exam and a Pap test every three years. For women ages 50-65, they may recommend the Pap test and pelvic exam, combined with testing for human papilloma virus (HPV), every five years. Some types of HPV increase your risk of cervical cancer. Testing for HPV may also be done on women of any age who have unclear Pap test results.  Other health care providers may not recommend any screening for nonpregnant women who are considered low risk for pelvic cancer and have no symptoms. Ask your health care provider if a screening pelvic exam is right for you.  If you have had past treatment for cervical cancer or a condition that could lead to cancer, you need Pap tests and screening for cancer for at least 20 years after your treatment. If Pap tests have been discontinued for you, your risk factors (such as having a new sexual  partner) need to be reassessed to determine if you should start having screenings again. Some women have medical problems that increase the chance of getting cervical cancer. In these cases, your health care provider may recommend that you have screening and Pap tests more often.  If you have a family history of uterine cancer or ovarian cancer, talk with your health care provider about genetic screening.  If you have vaginal bleeding after reaching menopause, tell your health care provider.  There are currently no reliable tests available to screen for ovarian cancer. Lung Cancer  Lung cancer screening is recommended for adults 59-37 years old who are at high risk for lung cancer because of a history of smoking. A yearly low-dose CT scan of the lungs is recommended if you:  Currently smoke.  Have a history of at least 30 pack-years of smoking and you currently smoke or have quit within the past 15 years. A pack-year is smoking an average of one pack of cigarettes per day for one year. Yearly screening should:  Continue until it has been 15 years since you quit.  Stop if you develop a health problem that would prevent you from having lung cancer treatment. Colorectal Cancer  This type of cancer can be detected and can often be prevented.  Routine colorectal cancer screening usually begins at age 9 and  continues through age 73.  If you have risk factors for colon cancer, your health care provider may recommend that you be screened at an earlier age.  If you have a family history of colorectal cancer, talk with your health care provider about genetic screening.  Your health care provider may also recommend using home test kits to check for hidden blood in your stool.  A small camera at the end of a tube can be used to examine your colon directly (sigmoidoscopy or colonoscopy). This is done to check for the earliest forms of colorectal cancer.  Direct examination of the colon should be  repeated every 5-10 years until age 39. However, if early forms of precancerous polyps or small growths are found or if you have a family history or genetic risk for colorectal cancer, you may need to be screened more often. Skin Cancer  Check your skin from head to toe regularly.  Monitor any moles. Be sure to tell your health care provider:  About any new moles or changes in moles, especially if there is a change in a mole's shape or color.  If you have a mole that is larger than the size of a pencil eraser.  If any of your family members has a history of skin cancer, especially at a young age, talk with your health care provider about genetic screening.  Always use sunscreen. Apply sunscreen liberally and repeatedly throughout the day.  Whenever you are outside, protect yourself by wearing long sleeves, pants, a wide-brimmed hat, and sunglasses. What should I know about osteoporosis? Osteoporosis is a condition in which bone destruction happens more quickly than new bone creation. After menopause, you may be at an increased risk for osteoporosis. To help prevent osteoporosis or the bone fractures that can happen because of osteoporosis, the following is recommended:  If you are 59-26 years old, get at least 1,000 mg of calcium and at least 600 mg of vitamin D per day.  If you are older than age 1 but younger than age 67, get at least 1,200 mg of calcium and at least 600 mg of vitamin D per day.  If you are older than age 38, get at least 1,200 mg of calcium and at least 800 mg of vitamin D per day. Smoking and excessive alcohol intake increase the risk of osteoporosis. Eat foods that are rich in calcium and vitamin D, and do weight-bearing exercises several times each week as directed by your health care provider. What should I know about how menopause affects my mental health? Depression may occur at any age, but it is more common as you become older. Common symptoms of depression  include:  Low or sad mood.  Changes in sleep patterns.  Changes in appetite or eating patterns.  Feeling an overall lack of motivation or enjoyment of activities that you previously enjoyed.  Frequent crying spells. Talk with your health care provider if you think that you are experiencing depression. What should I know about immunizations? It is important that you get and maintain your immunizations. These include:  Tetanus, diphtheria, and pertussis (Tdap) booster vaccine.  Influenza every year before the flu season begins.  Pneumonia vaccine.  Shingles vaccine. Your health care provider may also recommend other immunizations. This information is not intended to replace advice given to you by your health care provider. Make sure you discuss any questions you have with your health care provider. Document Released: 12/05/2005 Document Revised: 05/02/2016 Document Reviewed: 07/17/2015  2017  Elsevier Fall Prevention in the Home Falls can cause injuries and can affect people from all age groups. There are many simple things that you can do to make your home safe and to help prevent falls. What can I do on the outside of my home?  Regularly repair the edges of walkways and driveways and fix any cracks.  Remove high doorway thresholds.  Trim any shrubbery on the main path into your home.  Use bright outdoor lighting.  Clear walkways of debris and clutter, including tools and rocks.  Regularly check that handrails are securely fastened and in good repair. Both sides of any steps should have handrails.  Install guardrails along the edges of any raised decks or porches.  Have leaves, snow, and ice cleared regularly.  Use sand or salt on walkways during winter months.  In the garage, clean up any spills right away, including grease or oil spills. What can I do in the bathroom?  Use night lights.  Install grab bars by the toilet and in the tub and shower. Do not use towel  bars as grab bars.  Use non-skid mats or decals on the floor of the tub or shower.  If you need to sit down while you are in the shower, use a plastic, non-slip stool.  Keep the floor dry. Immediately clean up any water that spills on the floor.  Remove soap buildup in the tub or shower on a regular basis.  Attach bath mats securely with double-sided non-slip rug tape.  Remove throw rugs and other tripping hazards from the floor. What can I do in the bedroom?  Use night lights.  Make sure that a bedside light is easy to reach.  Do not use oversized bedding that drapes onto the floor.  Have a firm chair that has side arms to use for getting dressed.  Remove throw rugs and other tripping hazards from the floor. What can I do in the kitchen?  Clean up any spills right away.  Avoid walking on wet floors.  Place frequently used items in easy-to-reach places.  If you need to reach for something above you, use a sturdy step stool that has a grab bar.  Keep electrical cables out of the way.  Do not use floor polish or wax that makes floors slippery. If you have to use wax, make sure that it is non-skid floor wax.  Remove throw rugs and other tripping hazards from the floor. What can I do in the stairways?  Do not leave any items on the stairs.  Make sure that there are handrails on both sides of the stairs. Fix handrails that are broken or loose. Make sure that handrails are as long as the stairways.  Check any carpeting to make sure that it is firmly attached to the stairs. Fix any carpet that is loose or worn.  Avoid having throw rugs at the top or bottom of stairways, or secure the rugs with carpet tape to prevent them from moving.  Make sure that you have a light switch at the top of the stairs and the bottom of the stairs. If you do not have them, have them installed. What are some other fall prevention tips?  Wear closed-toe shoes that fit well and support your feet.  Wear shoes that have rubber soles or low heels.  When you use a stepladder, make sure that it is completely opened and that the sides are firmly locked. Have someone hold the ladder while you  are using it. Do not climb a closed stepladder.  Add color or contrast paint or tape to grab bars and handrails in your home. Place contrasting color strips on the first and last steps.  Use mobility aids as needed, such as canes, walkers, scooters, and crutches.  Turn on lights if it is dark. Replace any light bulbs that burn out.  Set up furniture so that there are clear paths. Keep the furniture in the same spot.  Fix any uneven floor surfaces.  Choose a carpet design that does not hide the edge of steps of a stairway.  Be aware of any and all pets.  Review your medicines with your healthcare provider. Some medicines can cause dizziness or changes in blood pressure, which increase your risk of falling. Talk with your health care provider about other ways that you can decrease your risk of falls. This may include working with a physical therapist or trainer to improve your strength, balance, and endurance. This information is not intended to replace advice given to you by your health care provider. Make sure you discuss any questions you have with your health care provider. Document Released: 10/03/2002 Document Revised: 03/11/2016 Document Reviewed: 11/17/2014 Elsevier Interactive Patient Education  2017 [employer].
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
## 2016-10-30 LAB — COMPREHENSIVE METABOLIC PANEL
ALBUMIN: 3.8 g/dL (ref 3.6–5.1)
ALT: 9 U/L (ref 6–29)
AST: 16 U/L (ref 10–35)
Alkaline Phosphatase: 71 U/L (ref 33–130)
BUN: 14 mg/dL (ref 7–25)
CHLORIDE: 106 mmol/L (ref 98–110)
CO2: 26 mmol/L (ref 20–31)
CREATININE: 0.68 mg/dL (ref 0.60–0.93)
Calcium: 9 mg/dL (ref 8.6–10.4)
Glucose, Bld: 102 mg/dL — ABNORMAL HIGH (ref 65–99)
Potassium: 4 mmol/L (ref 3.5–5.3)
SODIUM: 143 mmol/L (ref 135–146)
Total Bilirubin: 0.4 mg/dL (ref 0.2–1.2)
Total Protein: 6.5 g/dL (ref 6.1–8.1)

## 2016-10-30 LAB — LIPID PANEL
CHOLESTEROL: 150 mg/dL (ref <200)
HDL: 51 mg/dL (ref >50)
LDL CALC: 78 mg/dL (ref <100)
TRIGLYCERIDES: 104 mg/dL (ref <150)
Total CHOL/HDL Ratio: 2.9 Ratio (ref <5.0)
VLDL: 21 mg/dL (ref <30)

## 2016-10-30 LAB — HEPATITIS C ANTIBODY: HCV Ab: NEGATIVE

## 2016-11-05 ENCOUNTER — Encounter: Payer: Self-pay | Admitting: Student

## 2016-11-05 ENCOUNTER — Ambulatory Visit (INDEPENDENT_AMBULATORY_CARE_PROVIDER_SITE_OTHER): Payer: Medicare Other | Admitting: Student

## 2016-11-05 VITALS — BP 154/86 | HR 62 | Temp 97.9°F | Ht 65.0 in | Wt 204.0 lb

## 2016-11-05 DIAGNOSIS — M129 Arthropathy, unspecified: Secondary | ICD-10-CM | POA: Diagnosis not present

## 2016-11-05 DIAGNOSIS — R05 Cough: Secondary | ICD-10-CM | POA: Diagnosis present

## 2016-11-05 DIAGNOSIS — M25561 Pain in right knee: Secondary | ICD-10-CM

## 2016-11-05 DIAGNOSIS — R059 Cough, unspecified: Secondary | ICD-10-CM

## 2016-11-05 DIAGNOSIS — I10 Essential (primary) hypertension: Secondary | ICD-10-CM

## 2016-11-05 MED ORDER — MELOXICAM 15 MG PO TABS: 15.0000 mg | ORAL_TABLET | Freq: Every day | ORAL | 0 refills | Status: DC

## 2016-11-05 MED ORDER — MOMETASONE FUROATE 50 MCG/ACT NA SUSP: 1.0000 | Freq: Every day | NASAL | 3 refills | Status: DC

## 2016-11-05 MED ORDER — GUAIFENESIN-CODEINE 100-10 MG/5ML PO SYRP: 5.0000 mL | ORAL_SOLUTION | Freq: Three times a day (TID) | ORAL | 0 refills | Status: DC | PRN

## 2016-11-05 MED ORDER — HYDROCHLOROTHIAZIDE 25 MG PO TABS: 25.0000 mg | ORAL_TABLET | Freq: Every day | ORAL | 3 refills | Status: DC

## 2016-11-05 MED ORDER — CETIRIZINE HCL 10 MG PO TABS: 10.0000 mg | ORAL_TABLET | Freq: Every day | ORAL | 11 refills | Status: DC

## 2016-11-05 MED ORDER — OMEPRAZOLE 20 MG PO CPDR: 20.0000 mg | DELAYED_RELEASE_CAPSULE | Freq: Two times a day (BID) | ORAL | 6 refills | Status: DC

## 2016-11-05 NOTE — Progress Notes (Signed)
Subjective:    Andrea Buck is a 71 y.o. old female here for cough and knee pain  HPI  Cough: Reports getting this every year around this time. Current symptom has been going on for one week. Getting worse. Sometimes to the extent of throwing up. Productive with clear to graysh phlegm. Saw some blood streaks a couple of times. Reports runny nose, congestion for one day. Reports throat irritation. Denies dysphagia, fever or chills. Admits some shortness of breath after coughing but denies chest pain.  Admits occasional night sweat. Denies weight loss, fatigue or poor appetite. Reports history GERD and is taking over-the-counter omeprazole. She has hoarse voice worsened from baseline. She has history of vocal cord paralysis. Patient reports getting a cough syrup from our clinic when she get such symptoms in the winter.   Right knee pain: Chronic issue. On and off for a year. Sometimes get swollen. Has gotten worse recently. Denies reddness. Pain with walking. Better with elevation. Has been taking mobic which helps temporarily. Reports radiating pain down her thigh over the lateral aspect. Describes pain as achy and throbbing. Denies numbness or weakness in her legs. Negative constitutional symptoms as above.   PMH/Problem List: has GLOMUS TUMOR; OBESITY, NOS; DEPRESSION, MAJOR, RECURRENT; ANXIETY; HYPERTENSION, BENIGN SYSTEMIC; GASTROESOPHAGEAL REFLUX, NO ESOPHAGITIS; MENOPAUSAL SYNDROME; Arthropathy; INCONTINENCE, URGE; Vocal cord paralysis; Sleep disorder; Dyspnea; Acute bronchitis; Wrist pain, left; Back pain; Cough; Dyslipidemia; and Right knee pain on her problem list.   has a past medical history of Blood transfusion; Bronchitis, chronic ([place]) (2005); Carpal tunnel syndrome (12/24/2006); CHOLELITHIASIS (04/25/2009); Depression; GASTROESOPHAGEAL REFLUX, NO ESOPHAGITIS (12/24/2006); GLOMUS TUMOR (2001); Hyperlipidemia; Hypertension; NECK MASS (12/29/2006); and Vocal cord paralysis (05/26/2011).  [place] [employer]  History  Substance Use Topics  . Smoking status: Never Smoker  . Smokeless tobacco: Never Used  . Alcohol use No    Immunizations needed: Patient to get TdaP And Zostavax at a pharmacy  Review of Systems See history of present illness for pertinent positives and negatives    Objective:     Vitals:   11/05/16 1458  BP: (!) 154/86  Pulse: 62  Temp: 97.9 F (36.6 C)  TempSrc: Oral  SpO2: 99%  Weight: 92.5 kg (204 lb)  Height: 5\' 5"  (1.651 m)    Physical Exam GEN: appears well, no apparent distress. Eyes: conjunctiva without injection, sclera anicteric Nares: no rhinorrhea, congestion or erythema  Oropharynx: mmm without erythema or exudation HEM: negative for cervical or periauricular lymphadenopathies CVS: RRR, nl S1&S2, no murmurs, no edema RESP: speaks in full sentence, no IWOB, good air movement bilaterally, CTAB MSK:  No bony deformities, inflammation/skin lesion, or tenderness in bony prominences, soft tissue or joint lines. Both knees appears equal and symmetric.  No baker's cyst. Full ROM (extension/flexion). Limited internal and external rotation.  Anterior/Posterior Drawer signs, Lachman's, Varus and Valgus tests are negative. Positive crepitus bilaterally. No effusion, bulge/balloon sign. SKIN: no apparent skin lesion. Small scar over anterior aspect of her neck from previous thyroid surgery.  ENDO: negative thyromegaly.  NEURO: alert and oiented appropriately, no gross deficits. Motor 5/5 in both lower extremities, sensation intact in all dermatomes of the lower extremities.  PSYCH: euthymic mood with congruent affect.     Assessment and Plan:  Right knee pain Chronic issue. Likely osteoarthritis. Exam was bilateral crepitus suggesting osteo-arthritis versus miniscule tear. Offered options including increasing her meloxicam dose or steroid injection. Patient likes to try meloxicam 15 mg first. Sent prescription to the pharmacy.   Cough Seasonal nature  of this  makes postnasal drip more likely. She also have runny nose and congestion which makes viral URI possibility. She also have history of GERD which could cause her cough.  Recommended conservative management with good hydration and a tablespoonful of honey before bedtime. Also discussed about appropriate use of Nasonex. Refilled her prescription for PPI.  Gave prescription for Cheratussin if symptoms won't improve with the above measures in the next 1 week. Discussed return precautions as well.   HYPERTENSION, BENIGN SYSTEMIC Blood pressure mildly elevated. Hasn't been taking her blood pressure medication -Refilled her hydrochlorothiazide -Return in 2 weeks for follow-up   Mercy Riding, MD 11/05/16 Pager: [phone]

## 2016-11-05 NOTE — Assessment & Plan Note (Addendum)
Seasonal nature of this makes postnasal drip more likely. She also have runny nose and congestion which makes viral URI possibility. She also have history of GERD which could cause her cough.  Recommended conservative management with good hydration and a tablespoonful of honey before bedtime. Also discussed about appropriate use of Nasonex. Refilled her prescription for PPI.  Gave prescription for Cheratussin if symptoms won't improve with the above measures in the next 1 week. Discussed return precautions as well.

## 2016-11-05 NOTE — Assessment & Plan Note (Signed)
Chronic issue. Likely osteoarthritis. Exam was bilateral crepitus suggesting osteo-arthritis versus miniscule tear. Offered options including increasing her meloxicam dose or steroid injection. Patient likes to try meloxicam 15 mg first. Sent prescription to the pharmacy.

## 2016-11-05 NOTE — Progress Notes (Signed)
I have reviewed this visit and discussed with Howell Rucks, RN, BSN, and agree with her documentation.  Wendee Beavers, MD, PGY-2 11/05/2016 7:13 PM

## 2016-11-05 NOTE — Patient Instructions (Addendum)
It was great seeing you today! We have addressed the following issues today  1. Knee pain: This is likely due to also arthritis. Have sent a prescription for meloxicam 15 mg to your pharmacy. Take this medication as needed for pain. 2. Cough: This could be due to common cold or seasonal allergy. I recommend trying a tablespoonful of honey before bedtime. I also recommend using the Nasonex as we discussed. Make sure that you're always hydrated. Cough might take up to 6 weeks to resolve completely. If no improvement over the next one week, please fill the prescription I gave you and start taking. Please come back and see [place] if you have worsening of your symptoms 3.  Blood pressure: I have sent your prescription for hydrochlorothiazide to your pharmacy. Please come back and see [place] in 2 weeks for follow-up on this.    If we did any lab work today, and the results require attention, either me or my nurse will get in touch with you. If everything is normal, you will get a letter in mail. If you don't hear from [place] in two weeks, please give [place] a call. Otherwise, we look forward to seeing you again at your next visit. If you have any questions or concerns before then, please call the clinic at [phone].   Please bring all your medications to every doctors visit   Sign up for My Chart to have easy access to your labs results, and communication with your Primary care physician.     Please check-out at the front desk before leaving the clinic.    Take Care,

## 2016-11-05 NOTE — Assessment & Plan Note (Signed)
Blood pressure mildly elevated. Hasn't been taking her blood pressure medication -Refilled her hydrochlorothiazide -Return in 2 weeks for follow-up

## 2016-11-27 ENCOUNTER — Encounter: Payer: Self-pay | Admitting: Student

## 2016-11-28 ENCOUNTER — Ambulatory Visit: Payer: Medicare Other | Admitting: Student

## 2016-12-01 ENCOUNTER — Other Ambulatory Visit: Payer: Self-pay | Admitting: Student

## 2016-12-01 DIAGNOSIS — Z1231 Encounter for screening mammogram for malignant neoplasm of breast: Secondary | ICD-10-CM

## 2016-12-02 ENCOUNTER — Telehealth: Payer: Self-pay

## 2016-12-02 NOTE — Telephone Encounter (Signed)
Left voicemail stating [facility] calling to check in as promised after last month's appointment, gave office number in case she'd like to make an appointment.

## 2016-12-16 ENCOUNTER — Ambulatory Visit: Payer: Medicare Other

## 2016-12-31 ENCOUNTER — Ambulatory Visit
Admission: RE | Admit: 2016-12-31 | Discharge: 2016-12-31 | Disposition: A | Discharge status: Home / Self Care | Payer: Medicare Other | Source: Ambulatory Visit | Attending: Family Medicine | Admitting: Family Medicine

## 2016-12-31 DIAGNOSIS — Z1231 Encounter for screening mammogram for malignant neoplasm of breast: Secondary | ICD-10-CM | POA: Diagnosis not present

## 2017-02-10 ENCOUNTER — Encounter: Payer: Self-pay | Admitting: Internal Medicine

## 2017-02-10 ENCOUNTER — Ambulatory Visit (INDEPENDENT_AMBULATORY_CARE_PROVIDER_SITE_OTHER): Payer: Medicare Other | Admitting: Internal Medicine

## 2017-02-10 VITALS — BP 142/76 | HR 75 | Temp 97.6°F | Wt 202.4 lb

## 2017-02-10 DIAGNOSIS — M5431 Sciatica, right side: Secondary | ICD-10-CM

## 2017-02-10 DIAGNOSIS — M5441 Lumbago with sciatica, right side: Secondary | ICD-10-CM | POA: Diagnosis present

## 2017-02-10 DIAGNOSIS — M25561 Pain in right knee: Secondary | ICD-10-CM | POA: Diagnosis not present

## 2017-02-10 DIAGNOSIS — M5430 Sciatica, unspecified side: Secondary | ICD-10-CM | POA: Insufficient documentation

## 2017-02-10 DIAGNOSIS — G8929 Other chronic pain: Secondary | ICD-10-CM

## 2017-02-10 HISTORY — DX: Sciatica, unspecified side: M54.30

## 2017-02-10 MED ORDER — GABAPENTIN 100 MG PO CAPS: ORAL_CAPSULE | ORAL | 0 refills | Status: DC

## 2017-02-10 MED ORDER — MELOXICAM 15 MG PO TABS: 15.0000 mg | ORAL_TABLET | Freq: Every day | ORAL | 0 refills | Status: DC

## 2017-02-10 NOTE — Assessment & Plan Note (Signed)
Symptoms most consistent with sciatica. No red flags, including weakness, numbness, incontinence. Able to ambulate well. No abnormalities on exam. Patient reporting significant fatigue with gabapentin, so would like to switch to alternative agent such as duloxetine, however patient already taking sertraline and buproprion so would have to wean off at least sertraline before beginning duloxetine. Could also consider tramadol, however given patient's age would like to avoid another sedative agent. Discussed options with patient, who would like to try to continue gabapentin but change dosing. Patient also requesting lumbar spine xray, as she believes there is a bony abnormality causing her continued pain. Discussed that symptoms are likely muscular or neurological in etiology, but that we can obtain xray if she desires.  - Continue gabapentin, but prescribed 100mg  tablets. Will try 100mg  in AM, 100mg  at lunch, and 300mg  qhs, however patient understands that she can increase or decrease as necessary for pain and sedation.  - Lumbar spine xray ordered to be obtained at patient's earliest convenience, however feel that this will likely show DJD but probably no other helpful information - Refilled Mobic - F/u with PCP if no improvement

## 2017-02-10 NOTE — Patient Instructions (Addendum)
It was nice meeting you today Ms. Gebel!  For your back pain, you can begin taking 100 mg gabapentin in the morning and one at lunchtime, in addition to the 300 mg you are taking before bed. You can take a maximum of 300 mg three times a day, so if you feel that you need to increase the amount and are able to tolerate this without dizziness, you can do so gradually.  Also please begin taking Mobic every morning to help with the pain.  It is important to stay active so that your joints do not get stiff and worsen the pain. You can also try using a heating pad or ice pack to help with pain.   I have ordered a spinal xray for you. If the pain is very bad you can have this done at [facility].   If your pain does not get any better, you develop new weakness or numbness in your legs, or you become incontinent of bladder or bowel, please call us right away or go to the emergency room.   If you have any questions or concerns, please feel free to call the clinic.   Be well,  Dr. Avon Gully

## 2017-02-10 NOTE — Progress Notes (Signed)
Subjective:   Patient: Andrea Buck       Birthdate: [birth year]       MRN: [id number]      HPI  Andrea Buck is a 71 y.o. female presenting for same day appointment for hip pain.   Hip pain Patient has chronic history of R knee pain, but reports over the past two weeks she has had pain beginning in her R hip, then extending down to her R knee and foot. Says she has also had intermittent swelling of the R knee and feels as if it is going to give away sometimes, however she has never fallen. Patient describes the pain as radiating down the back of her leg, to her knee, then down the back of her calf to her foot. Says it feels as if her leg is tingling and is "waking up" when touched. Sometimes the pain is severe on top of her foot making it difficulty to walk. She says it feels as if something is "sitting on a nerve" when the pain is most severe. She has taken ibuprofen, Mobic, and Tylenol with some relief. She is prescribed gabapentin, which she was previously taking 300mg  TID, but stopped taking entirely due to fatigue at work. She has now started taking 300mg  some nights, but wakes up during the night due to pain. She does not take at all during the day. She also endorses chronic bilateral lower back pain which has not worsened but has not improved. Denies bladder or bowel incontinence. Denies new numbness other than known neuropathy. She has had sciatica in the past after a back surgery, but says this episode is worse. She was initially prescribed gabapentin specifically for sciatica.   Smoking status reviewed. Patient is never smoker.   Review of Systems See HPI.     Objective:  Physical Exam  Constitutional: She is oriented to person, place, and time and well-developed, well-nourished, and in no distress.  HENT:  Head: Normocephalic and atraumatic.  Eyes: Conjunctivae and EOM are normal. Right eye exhibits no discharge. Left eye exhibits no discharge.  Pulmonary/Chest: Effort normal.  No respiratory distress.  Musculoskeletal:  5/5 strength lower extremities bilaterally. No swelling, erythema, or bony abnormalities noted in knees or hips. No TTP of knees, ankles, hips, lower back. No spiny abnormalities noted. Slight difficulty getting onto exam table reportedly due to pain, but gait and ROM otherwise WNL. Able to ambulate without assistive device.   Neurological: She is alert and oriented to person, place, and time.  Skin: Skin is warm and dry.  Psychiatric: Affect and judgment normal.      Assessment & Plan:  Sciatica Symptoms most consistent with sciatica. No red flags, including weakness, numbness, incontinence. Able to ambulate well. No abnormalities on exam. Patient reporting significant fatigue with gabapentin, so would like to switch to alternative agent such as duloxetine, however patient already taking sertraline and buproprion so would have to wean off at least sertraline before beginning duloxetine. Could also consider tramadol, however given patient's age would like to avoid another sedative agent. Discussed options with patient, who would like to try to continue gabapentin but change dosing. Patient also requesting lumbar spine xray, as she believes there is a bony abnormality causing her continued pain. Discussed that symptoms are likely muscular or neurological in etiology, but that we can obtain xray if she desires.  - Continue gabapentin, but prescribed 100mg  tablets. Will try 100mg  in AM, 100mg  at lunch, and 300mg  qhs, however patient understands  that she can increase or decrease as necessary for pain and sedation.  - Lumbar spine xray ordered to be obtained at patient's earliest convenience, however feel that this will likely show DJD but probably no other helpful information - Refilled Mobic - F/u with PCP if no improvement   Adin Hector, MD, MPH PGY-2 [facility] Pager [phone]

## 2017-06-13 ENCOUNTER — Other Ambulatory Visit: Payer: Self-pay | Admitting: Internal Medicine

## 2017-06-13 DIAGNOSIS — M25561 Pain in right knee: Secondary | ICD-10-CM

## 2017-07-01 ENCOUNTER — Encounter: Payer: Self-pay | Admitting: Student

## 2017-07-01 ENCOUNTER — Ambulatory Visit (INDEPENDENT_AMBULATORY_CARE_PROVIDER_SITE_OTHER): Payer: Medicare Other | Admitting: Student

## 2017-07-01 VITALS — BP 132/82 | Temp 97.9°F | Ht 65.0 in | Wt 200.2 lb

## 2017-07-01 DIAGNOSIS — E2839 Other primary ovarian failure: Secondary | ICD-10-CM

## 2017-07-01 DIAGNOSIS — M25552 Pain in left hip: Secondary | ICD-10-CM

## 2017-07-01 DIAGNOSIS — E785 Hyperlipidemia, unspecified: Secondary | ICD-10-CM

## 2017-07-01 DIAGNOSIS — Z23 Encounter for immunization: Secondary | ICD-10-CM

## 2017-07-01 DIAGNOSIS — M25551 Pain in right hip: Secondary | ICD-10-CM

## 2017-07-01 MED ORDER — CALCIUM CARBONATE-VITAMIN D3 600-400 MG-UNIT PO TABS: ORAL_TABLET | ORAL | 3 refills | Status: DC

## 2017-07-01 MED ORDER — PRAVASTATIN SODIUM 40 MG PO TABS: 40.0000 mg | ORAL_TABLET | Freq: Every day | ORAL | 3 refills | Status: DC

## 2017-07-01 MED ORDER — TRAZODONE HCL 50 MG PO TABS: 50.0000 mg | ORAL_TABLET | Freq: Every day | ORAL | 3 refills | Status: DC

## 2017-07-01 MED ORDER — GABAPENTIN 300 MG PO CAPS: ORAL_CAPSULE | ORAL | 3 refills | Status: DC

## 2017-07-01 NOTE — Patient Instructions (Signed)
It was great seeing you today! We have addressed the following issues today 1. Hip and leg pain: I have increased your gabapentin to 2 capsules at night, and 1 capsule in the morning and afternoon. I also recommend heat and ice, whichever helps. I also recommend taking vitamin D/calcium daily. 2.  Cholesterol: I sent a prescription for pravastatin to your pharmacy. Please pick up his prescription and start taking.  If we did any lab work today, and the results require attention, either me or my nurse will get in touch with you. If everything is normal, you will get a letter in mail and a message via . If you don't hear from [place] in two weeks, please give [place] a call. Otherwise, we look forward to seeing you again at your next visit. If you have any questions or concerns before then, please call the clinic at [phone].  Please bring all your medications to every doctors visit  Sign up for My Chart to have easy access to your labs results, and communication with your Primary care physician.    Please check-out at the front desk before leaving the clinic.    Take Care,   Dr. Cyndia Skeeters

## 2017-07-01 NOTE — Progress Notes (Signed)
Subjective:    Andrea Buck is a 71 y.o. old female here bilateral hip pain  HPI Bilateral hip pain: this has been going on for 4-5 months. No inciting factor. Describes the pain as sharp and stabbing. Radiates down to her ankles posteriorly and laterally. Denies numbness. Reports tingling and reports weakness in her legs. Happens most when she is in bed. Pain wakes her up at night. Overall, pain is getting worse. Pillow under her backside helps. Gabapentin helped with pain.  Denies fever and chills. Reports urgency with urination but denies retention. Denies bowel issues. Denies saddle anesthesia. No unintentional weight loss or night sweat.   PMH/Problem List: has GLOMUS TUMOR; OBESITY, NOS; DEPRESSION, MAJOR, RECURRENT; ANXIETY; HYPERTENSION, BENIGN SYSTEMIC; GASTROESOPHAGEAL REFLUX, NO ESOPHAGITIS; MENOPAUSAL SYNDROME; Arthropathy; INCONTINENCE, URGE; Vocal cord paralysis; Sleep disorder; Dyspnea; Acute bronchitis; Wrist pain, left; Back pain; Cough; Dyslipidemia; Right knee pain; Bilateral pseudophakia; Combined senile cataract; Conjunctivochalasis; Horner's syndrome; Myopia with astigmatism and presbyopia; Pharyngoesophageal dysphagia; Posterior vitreous detachment of both eyes; Ptosis of eyelid; Schatzki's ring; Sciatica; Pain of both hip joints; Neuropathic pain; and Estrogen deficiency on her problem list.   has a past medical history of Blood transfusion; Bronchitis, chronic ([place]) (2005); Carpal tunnel syndrome (12/24/2006); CHOLELITHIASIS (04/25/2009); Depression; GASTROESOPHAGEAL REFLUX, NO ESOPHAGITIS (12/24/2006); GLOMUS TUMOR (2001); Hyperlipidemia; Hypertension; NECK MASS (12/29/2006); and Vocal cord paralysis (05/26/2011).  FH:  Family History  Problem Relation Age of Onset  . Cancer Mother        Uterus  . Diabetes Mother   . Hypertension Mother   . Heart disease Father   . Stroke Father   . Hypertension Father   . Diabetes Father   . Diabetes Sister   . Hypertension Sister   .  Diabetes Brother   . Mental illness Son        PTSD  . Diabetes Son   . Hypertension Son   . Hyperlipidemia Son   . Obesity Son   . Drug abuse Son   . Diabetes Sister   . Hypertension Sister   . Diabetes Sister   . Hypertension Sister   . Multiple sclerosis Brother   . Diabetes Brother   . Hypertension Brother   . Arthritis Son   . Drug abuse Son   . Hyperlipidemia Son     [facility] Social History  Substance Use Topics  . Smoking status: Never Smoker  . Smokeless tobacco: Never Used  . Alcohol use No    Review of Systems Review of systems negative except for pertinent positives and negatives in history of present illness above.     Objective:     Vitals:   07/01/17 1035  BP: 132/82  Temp: 97.9 F (36.6 C)  TempSrc: Oral  SpO2: 98%  Weight: 200 lb 3.2 oz (90.8 kg)  Height: 5\' 5"  (1.651 m)   Body mass index is 33.32 kg/m.  Physical Exam GEN: appears well, no apparent distress. CVS: RRR, nl S1&S2, no murmurs, no edema RESP: no IWOB GI: BS present & normal, soft, NTND GU: no suprapubic or CVA tenderness MSK: Back:  Normal skin, spine with normal alignment and no deformity.    No step offs, no tenderness to palpation over spines or paraspinous muscles or hips     ROM is full at lumbar sacral regions.   Lying and seated SLR are negative bilaterally  Iliotibial stretch without pain  FABER triggers pain in both hips posteriorly. FADIR negative  Neuro exam in LE: motor 5/5 in all muscle groups,  light sensation intact in L4-S1 dermatomes, patellar reflexes  1+ bilaterally, normal gait.   SKIN: no apparent skin lesion NEURO: alert and oiented appropriately, no gross deficits  PSYCH: euthymic mood with congruent affect    Assessment and Plan:  1. Bilateral hip pain: chronic issue. Likely osteoarthritis of the hip joints with some radiculopathy. FABER illicit pain in both hips posteriorly. FADIR negative. She also has Tingling and radiation to her feet  suggesting some component of radiculopathy. No red flags for caudae equina, fracture or malignancy. Gabapentin is helpful. She is on 300 mg three times a day. She has good renal function.  -Increase nocturnal dose to 600 mg.  -Recommended stretching exercises -DEXA scan  3. Dyslipidemia - Pravastatin (PRAVACHOL) 40 MG tablet; Take 1 tablet (40 mg total) by mouth daily.  Dispense: 90 tablet; Refill: 3  4. Estrogen deficiency - DG Bone Density; Future  5. Need for immunization against influenza - Flu Vaccine QUAD 36+ mos IM   Return in about 1 month (around 07/31/2017) for Physical.  Mercy Riding, MD 07/02/17 Pager: [phone]

## 2017-07-02 DIAGNOSIS — M25551 Pain in right hip: Secondary | ICD-10-CM | POA: Insufficient documentation

## 2017-07-02 DIAGNOSIS — E2839 Other primary ovarian failure: Secondary | ICD-10-CM | POA: Insufficient documentation

## 2017-07-02 DIAGNOSIS — M25552 Pain in left hip: Secondary | ICD-10-CM | POA: Insufficient documentation

## 2017-07-02 DIAGNOSIS — M792 Neuralgia and neuritis, unspecified: Secondary | ICD-10-CM | POA: Insufficient documentation

## 2017-07-02 NOTE — Assessment & Plan Note (Signed)
Chronic issue. Likely osteoarthritis of the hip joints with some radiculopathy. FABER illicit pain in both hips posteriorly. FADIR negative. She also has Tingling and radiation to her feet suggesting some component of radiculopathy. No red flags for caudae equina, fracture or malignancy. Gabapentin is helpful. She is on 300 mg three times a day. She has good renal function.  -Increase nocturnal dose to 600 mg.  -Recommended stretching exercises -DEXA scan

## 2017-07-03 ENCOUNTER — Other Ambulatory Visit: Payer: Self-pay | Admitting: Student

## 2017-07-03 DIAGNOSIS — G479 Sleep disorder, unspecified: Secondary | ICD-10-CM

## 2017-07-03 MED ORDER — TRAZODONE HCL 50 MG PO TABS: 50.0000 mg | ORAL_TABLET | Freq: Every day | ORAL | 3 refills | Status: DC

## 2017-07-16 ENCOUNTER — Ambulatory Visit
Admission: RE | Admit: 2017-07-16 | Discharge: 2017-07-16 | Disposition: A | Discharge status: Home / Self Care | Payer: Medicare Other | Source: Ambulatory Visit | Attending: Family Medicine | Admitting: Family Medicine

## 2017-07-16 DIAGNOSIS — M810 Age-related osteoporosis without current pathological fracture: Secondary | ICD-10-CM | POA: Diagnosis not present

## 2017-07-16 DIAGNOSIS — Z780 Asymptomatic menopausal state: Secondary | ICD-10-CM | POA: Diagnosis not present

## 2017-07-16 DIAGNOSIS — E2839 Other primary ovarian failure: Secondary | ICD-10-CM

## 2017-07-18 ENCOUNTER — Encounter: Payer: Self-pay | Admitting: Student

## 2017-07-18 NOTE — Progress Notes (Signed)
DEXA scan with osteoporosis. Discussed with patient and advised her to schedule an apt with me as soon as possible to discuss about this. Patient voiced understanding and agrees. Result letter routed to [facility] pool for mail out.

## 2017-07-21 ENCOUNTER — Ambulatory Visit (INDEPENDENT_AMBULATORY_CARE_PROVIDER_SITE_OTHER): Payer: Medicare Other | Admitting: Student

## 2017-07-21 ENCOUNTER — Encounter: Payer: Self-pay | Admitting: Student

## 2017-07-21 VITALS — BP 130/84 | HR 65 | Temp 97.7°F | Ht 65.0 in | Wt 201.4 lb

## 2017-07-21 DIAGNOSIS — Z1211 Encounter for screening for malignant neoplasm of colon: Secondary | ICD-10-CM | POA: Diagnosis not present

## 2017-07-21 DIAGNOSIS — Z23 Encounter for immunization: Secondary | ICD-10-CM | POA: Diagnosis not present

## 2017-07-21 DIAGNOSIS — M810 Age-related osteoporosis without current pathological fracture: Secondary | ICD-10-CM

## 2017-07-21 DIAGNOSIS — Z1329 Encounter for screening for other suspected endocrine disorder: Secondary | ICD-10-CM | POA: Diagnosis not present

## 2017-07-21 HISTORY — DX: Encounter for screening for other suspected endocrine disorder: Z13.29

## 2017-07-21 MED ORDER — TETANUS-DIPHTH-ACELL PERTUSSIS 5-2.5-18.5 LF-MCG/0.5 IM SUSP: 0.5000 mL | Freq: Once | INTRAMUSCULAR | 0 refills | Status: AC

## 2017-07-21 MED ORDER — IBANDRONATE SODIUM 150 MG PO TABS: 150.0000 mg | ORAL_TABLET | ORAL | 12 refills | Status: DC

## 2017-07-21 NOTE — Progress Notes (Signed)
Subjective:    Andrea Buck is a 71 y.o. old female here to discuss about her bone density scan.  HPI Osteoporosis: Patient had a bone density scan about a week ago which showed osteoporosis with T score of -2.5 in right femoral neck. Patient has no previous history of fracture. She denies family history of postoperative fracture as well. She has never smoked. She drinks alcohol rarely. She has been on omeprazole but reports taking this rarely.   PMH/Problem List: has GLOMUS TUMOR; OBESITY, NOS; DEPRESSION, MAJOR, RECURRENT; ANXIETY; HYPERTENSION, BENIGN SYSTEMIC; GASTROESOPHAGEAL REFLUX, NO ESOPHAGITIS; MENOPAUSAL SYNDROME; Arthropathy; INCONTINENCE, URGE; Vocal cord paralysis; Sleep disorder; Dyspnea; Acute bronchitis; Wrist pain, left; Back pain; Cough; Dyslipidemia; Right knee pain; Bilateral pseudophakia; Combined senile cataract; Conjunctivochalasis; Horner's syndrome; Myopia with astigmatism and presbyopia; Pharyngoesophageal dysphagia; Posterior vitreous detachment of both eyes; Ptosis of eyelid; Schatzki's ring; Sciatica; Pain of both hip joints; Neuropathic pain; Estrogen deficiency; Age-related osteoporosis without current pathological fracture; and Screening for hypothyroidism on her problem list.   has a past medical history of Blood transfusion; Bronchitis, chronic ([place]) (2005); Carpal tunnel syndrome (12/24/2006); CHOLELITHIASIS (04/25/2009); Depression; GASTROESOPHAGEAL REFLUX, NO ESOPHAGITIS (12/24/2006); GLOMUS TUMOR (2001); Hyperlipidemia; Hypertension; NECK MASS (12/29/2006); and Vocal cord paralysis (05/26/2011).  FH:  Family History  Problem Relation Age of Onset  . Cancer Mother        Uterus  . Diabetes Mother   . Hypertension Mother   . Heart disease Father   . Stroke Father   . Hypertension Father   . Diabetes Father   . Diabetes Sister   . Hypertension Sister   . Diabetes Brother   . Mental illness Son        PTSD  . Diabetes Son   . Hypertension Son   . Hyperlipidemia  Son   . Obesity Son   . Drug abuse Son   . Diabetes Sister   . Hypertension Sister   . Diabetes Sister   . Hypertension Sister   . Multiple sclerosis Brother   . Diabetes Brother   . Hypertension Brother   . Arthritis Son   . Drug abuse Son   . Hyperlipidemia Son     [facility] Social History  Substance Use Topics  . Smoking status: Never Smoker  . Smokeless tobacco: Never Used  . Alcohol use No    Review of Systems Review of systems negative except for pertinent positives and negatives in history of present illness above.     Objective:     Vitals:   07/21/17 0849  BP: 130/84  Pulse: 65  Temp: 97.7 F (36.5 C)  TempSrc: Oral  SpO2: 98%  Weight: 201 lb 6.4 oz (91.4 kg)  Height: 5' 5"  (1.651 m)   Body mass index is 33.51 kg/m.  Physical Exam GEN: appears well, no apparent distress. Head: normocephalic and atraumatic  Eyes: conjunctiva without injection, sclera anicteric HEM: negative for cervical or periauricular lymphadenopathies ENDO: negative thyromegally CVS: RRR, nl S1&S2, no murmurs, no edema RESP: no IWOB, good air movement bilaterally, CTAB MSK: no focal tenderness or notable swelling SKIN: no apparent skin lesion NEURO: alert and oiented appropriately, no gross deficits  PSYCH: euthymic mood with congruent affect    Assessment and Plan:  1. Age-related osteoporosis without current pathological fracture: patient has a 14% chance of major osteoporotic fracture based on frax score. Discussed options about medical treatments. She chose ibandronate monthly. She is never smoker. She drinks alcohol rarely. I discussed daily exercise including strength exercise. She is  already on vitamin D/calcium. Gave a prescription for him pamidronate 150 mg tablet with 12 refills. Will order the following blood and urine work to completely rule out secondary osteoporosis.  - CMP14+EGFR - CBC with Differential/Platelet - Phosphorus - Parathyroid hormone, intact (no Ca) -  Calcium / creatinine ratio, urine - ibandronate (BONIVA) 150 MG tablet; Take 1 tablet (150 mg total) by mouth every 30 (thirty) days. Take in the morning with a full glass of water, on an empty stomach, and do not take anything else by mouth or lie down for the next 30 min.  Dispense: 1 tablet; Refill: 12  2. Screening for hypothyroidism - TSH  3. Encounter for immunization - Tdap ([place]) 5-2.5-18.5 LF-MCG/0.5 injection; Inject 0.5 mLs into the muscle once.  Dispense: 0.5 mL; Refill: 0  4. Screening for colon cancer: I encouraged her to get a colonoscopy as soon as possible. Gave her the phone numbers to GI offices Return if symptoms worsen or fail to improve.  Mercy Riding, MD 07/21/17 Pager: [phone]

## 2017-07-21 NOTE — Assessment & Plan Note (Signed)
Patient has a 14% chance of major osteoporotic fracture based on frax score. Discussed options about medical treatments. She chose ibandronate monthly. She is never smoker. She drinks alcohol rarely. I discussed daily exercise including strength exercise. She is already on vitamin D/calcium. Gave a prescription for him pamidronate 150 mg tablet with 12 refills. Will order the following blood and urine work to completely rule out secondary osteoporosis.  - CMP14+EGFR - CBC with Differential/Platelet - Phosphorus - Parathyroid hormone, intact (no Ca) - Calcium / creatinine ratio, urine - ibandronate (BONIVA) 150 MG tablet; Take 1 tablet (150 mg total) by mouth every 30 (thirty) days. Take in the morning with a full glass of water, on an empty stomach, and do not take anything else by mouth or lie down for the next 30 min.  Dispense: 1 tablet; Refill: 12

## 2017-07-21 NOTE — Patient Instructions (Addendum)
It was great seeing you today! We have addressed the following issues today 1. Osteoporosis: We have given you a prescription for Ibandronate. You take this medication to empty stomach in the morning every 30 days. Place to stay upright for 30 minutes to 1 hour after taking this medication. Continue taking your calcium/vitamin D as well. 2.   Screening for colon cancer: Please call one of the number below to schedule for a colonoscopy.  If we did any lab work today, and the results require attention, either me or my nurse will get in touch with you. If everything is normal, you will get a letter in mail and a message via . If you don't hear from [place] in two weeks, please give [place] a call. Otherwise, we look forward to seeing you again at your next visit. If you have any questions or concerns before then, please call the clinic at [phone].  Please bring all your medications to every doctors visit  Sign up for My Chart to have easy access to your labs results, and communication with your Primary care physician.    Please check-out at the front desk before leaving the clinic.    Take Care,   Dr. Cyndia Skeeters  Osteoporosis Osteoporosis happens when your bones become thinner and weaker. Weak bones can break (fracture) more easily when you slip or fall. Bones most at risk of breaking are in the hip, wrist, and spine. Follow these instructions at home:  Get enough calcium and vitamin D. These nutrients are good for your bones.  Exercise as told by your doctor.  Do not use any tobacco products. This includes cigarettes, chewing tobacco, and electronic cigarettes. If you need help quitting, ask your doctor.  Limit the amount of alcohol you drink.  Take medicines only as told by your doctor.  Keep all follow-up visits as told by your doctor. This is important.  Take care at home to prevent falls. Some ways to do this are: ? Keep rooms well lit and tidy. ? Put safety rails on your stairs. ? Put  a rubber mat in the bathroom and other places that are often wet or slippery. Get help right away if:  You fall.  You hurt yourself. This information is not intended to replace advice given to you by your health care provider. Make sure you discuss any questions you have with your health care provider. Document Released: 01/05/2012 Document Revised: 03/20/2016 Document Reviewed: 03/23/2014 Elsevier Interactive Patient Education  2018 [place] with early colon cancer usually have no warning signs or symptoms.  If found early, most patients can be cured, but if found when it has already spread, the chance of survival is not as good.  Colon cancer is the second most common cause of concern is in the [place] with over 56,000 deaths from colon cancer in 2005  Colon cancer is a common, treatable disease. Screening tests can find a cancer  early, before you have symptoms, and make it more likely that you will survive the disease.  Who needs to be tested? If you are age 4-75 yrs, you should be tested for colon cancer.  Ways to be tested:  A colonoscopy the best test to detect colon cancer. It requires you to drink a bowel preparation to clean out your colon before the test. During this test, a tube with a camera inserted into your rectum  and examines your entire colon. You can be given medicine to make you sleepy during the exam. Therefore, you will not be able to drive immediately after the test. There is a small risk of bowel injury during the test.   Stool cards that you can take home and take a sample of your stool is another option. The cards are not as good as colonoscopy at detecting cancer, but the tests are easier and cheaper.   To schedule the colonoscopy, you can call one of the 3 options below:  [facility]. Phone number: [phone]  [facility]. Phone number: [phone]  [facility]: Phone number [phone]

## 2017-07-22 LAB — CMP14+EGFR
ALBUMIN: 4 g/dL (ref 3.5–4.8)
ALK PHOS: 68 IU/L (ref 39–117)
ALT: 9 IU/L (ref 0–32)
AST: 16 IU/L (ref 0–40)
Albumin/Globulin Ratio: 1.6 (ref 1.2–2.2)
BUN / CREAT RATIO: 17 (ref 12–28)
BUN: 13 mg/dL (ref 8–27)
Bilirubin Total: 0.3 mg/dL (ref 0.0–1.2)
CO2: 28 mmol/L (ref 20–29)
CREATININE: 0.78 mg/dL (ref 0.57–1.00)
Calcium: 9.2 mg/dL (ref 8.7–10.3)
Chloride: 104 mmol/L (ref 96–106)
GFR calc Af Amer: 88 mL/min/{1.73_m2} (ref >59)
GFR calc non Af Amer: 77 mL/min/{1.73_m2} (ref >59)
GLOBULIN, TOTAL: 2.5 g/dL (ref 1.5–4.5)
Glucose: 97 mg/dL (ref 65–99)
Potassium: 4.2 mmol/L (ref 3.5–5.2)
SODIUM: 143 mmol/L (ref 134–144)
Total Protein: 6.5 g/dL (ref 6.0–8.5)

## 2017-07-22 LAB — CBC WITH DIFFERENTIAL/PLATELET
BASOS: 1 %
Basophils Absolute: 0 10*3/uL (ref 0.0–0.2)
EOS (ABSOLUTE): 0.2 10*3/uL (ref 0.0–0.4)
EOS: 5 %
HEMATOCRIT: 39.6 % (ref 34.0–46.6)
Hemoglobin: 12.5 g/dL (ref 11.1–15.9)
Immature Grans (Abs): 0 10*3/uL (ref 0.0–0.1)
Immature Granulocytes: 0 %
LYMPHS ABS: 1.5 10*3/uL (ref 0.7–3.1)
Lymphs: 43 %
MCH: 29.8 pg (ref 26.6–33.0)
MCHC: 31.6 g/dL (ref 31.5–35.7)
MCV: 94 fL (ref 79–97)
MONOS ABS: 0.3 10*3/uL (ref 0.1–0.9)
Monocytes: 9 %
Neutrophils Absolute: 1.4 10*3/uL (ref 1.4–7.0)
Neutrophils: 42 %
Platelets: 197 10*3/uL (ref 150–379)
RBC: 4.2 x10E6/uL (ref 3.77–5.28)
RDW: 13.9 % (ref 12.3–15.4)
WBC: 3.4 10*3/uL (ref 3.4–10.8)

## 2017-07-22 LAB — CALCIUM / CREATININE RATIO, URINE
Calcium, Urine: 11.4 mg/dL
Calcium/Creat.Ratio: 98 mg/g creat (ref 0–260)
Creatinine, Urine: 116 mg/dL

## 2017-07-22 LAB — PARATHYROID HORMONE, INTACT (NO CA): PTH: 91 pg/mL — ABNORMAL HIGH (ref 15–65)

## 2017-07-22 LAB — TSH: TSH: 1.95 u[IU]/mL (ref 0.450–4.500)

## 2017-08-06 ENCOUNTER — Encounter: Payer: Self-pay | Admitting: Student

## 2017-08-06 ENCOUNTER — Ambulatory Visit (INDEPENDENT_AMBULATORY_CARE_PROVIDER_SITE_OTHER): Payer: Medicare Other | Admitting: Student

## 2017-08-06 VITALS — BP 148/84 | HR 60 | Temp 98.3°F | Ht 65.0 in | Wt 202.0 lb

## 2017-08-06 DIAGNOSIS — M25552 Pain in left hip: Secondary | ICD-10-CM

## 2017-08-06 DIAGNOSIS — K219 Gastro-esophageal reflux disease without esophagitis: Secondary | ICD-10-CM | POA: Diagnosis not present

## 2017-08-06 DIAGNOSIS — F411 Generalized anxiety disorder: Secondary | ICD-10-CM | POA: Diagnosis not present

## 2017-08-06 DIAGNOSIS — F339 Major depressive disorder, recurrent, unspecified: Secondary | ICD-10-CM | POA: Diagnosis not present

## 2017-08-06 DIAGNOSIS — Z0000 Encounter for general adult medical examination without abnormal findings: Secondary | ICD-10-CM | POA: Diagnosis not present

## 2017-08-06 DIAGNOSIS — M25551 Pain in right hip: Secondary | ICD-10-CM

## 2017-08-06 MED ORDER — DULOXETINE HCL 30 MG PO CPEP: 30.0000 mg | ORAL_CAPSULE | Freq: Every day | ORAL | 0 refills | Status: DC

## 2017-08-06 MED ORDER — RANITIDINE HCL 150 MG PO TABS: 150.0000 mg | ORAL_TABLET | Freq: Two times a day (BID) | ORAL | 2 refills | Status: DC

## 2017-08-06 MED ORDER — ZOSTER VAC RECOMB ADJUVANTED 50 MCG/0.5ML IM SUSR: 0.5000 mL | Freq: Once | INTRAMUSCULAR | 1 refills | Status: DC

## 2017-08-06 NOTE — Patient Instructions (Addendum)
It was great seeing you today! We have addressed the following issues today 1. Leg pain: this is likely due to sciatica. Continue taking the gabapentin. We have also started you on Cymbalta. Take 1 capsule daily for 2 weeks. You may increase to 2 capsules daily after 2 weeks. I also recommend daily walking at least for 30-40 minutes.  2.   Stress/depression: we have stopped her sertraline or Zoloft. We have started you on Cymbalta as above. Please come back and see [place] in 2 weeks. You can also call the number we gave you if you would like to talk to our behavioral health people. 3.   Colon cancer screening: please call the number we gave you to schedule for colonoscopy.  4.  Acid reflux: stop taking meloxicam. You may try ranitidine as needed. We sent a prescription for this to the pharmacy.   If we did any lab work today, and the results require attention, either me or my nurse will get in touch with you. If everything is normal, you will get a letter in mail and a message via . If you don't hear from [place] in two weeks, please give [place] a call. Otherwise, we look forward to seeing you again at your next visit. If you have any questions or concerns before then, please call the clinic at [phone].  Please bring all your medications to every doctors visit  Sign up for My Chart to have easy access to your labs results, and communication with your Primary care physician.    Please check-out at the front desk before leaving the clinic.    Take Care,   Dr. Cyndia Skeeters    Sciatica Sciatica is pain, numbness, weakness, or tingling along your sciatic nerve. The sciatic nerve starts in the lower back and goes down the back of each leg. Sciatica happens when this nerve is pinched or has pressure put on it. Sciatica usually goes away on its own or with treatment. Sometimes, sciatica may keep coming back (recur). Follow these instructions at home: Medicines  Take over-the-counter and prescription  medicines only as told by your doctor.  Do not drive or use heavy machinery while taking prescription pain medicine. Managing pain  If directed, put ice on the affected area. ? Put ice in a plastic bag. ? Place a towel between your skin and the bag. ? Leave the ice on for 20 minutes, 2-3 times a day.  After icing, apply heat to the affected area before you exercise or as often as told by your doctor. Use the heat source that your doctor tells you to use, such as a moist heat pack or a heating pad. ? Place a towel between your skin and the heat source. ? Leave the heat on for 20-30 minutes. ? Remove the heat if your skin turns bright red. This is especially important if you are unable to feel pain, heat, or cold. You may have a greater risk of getting burned. Activity  Return to your normal activities as told by your doctor. Ask your doctor what activities are safe for you. ? Avoid activities that make your sciatica worse.  Take short rests during the day. Rest in a lying or standing position. This is usually better than sitting to rest. ? When you rest for a long time, do some physical activity or stretching between periods of rest. ? Avoid sitting for a long time without moving. Get up and move around at least one time each hour.  Exercise and stretch regularly, as told by your doctor.  Do not lift anything that is heavier than 10 lb (4.5 kg) while you have symptoms of sciatica. ? Avoid lifting heavy things even when you do not have symptoms. ? Avoid lifting heavy things over and over.  When you lift objects, always lift in a way that is safe for your body. To do this, you should: ? Bend your knees. ? Keep the object close to your body. ? Avoid twisting. General instructions  Use good posture. ? Avoid leaning forward when you are sitting. ? Avoid hunching over when you are standing.  Stay at a healthy weight.  Wear comfortable shoes that support your feet. Avoid wearing high  heels.  Avoid sleeping on a mattress that is too soft or too hard. You might have less pain if you sleep on a mattress that is firm enough to support your back.  Keep all follow-up visits as told by your doctor. This is important. Contact a doctor if:  You have pain that: ? Wakes you up when you are sleeping. ? Gets worse when you lie down. ? Is worse than the pain you have had in the past. ? Lasts longer than 4 weeks.  You lose weight for without trying. Get help right away if:  You cannot control when you pee (urinate) or poop (have a bowel movement).  You have weakness in any of these areas and it gets worse. ? Lower back. ? Lower belly (pelvis). ? Butt (buttocks). ? Legs.  You have redness or swelling of your back.  You have a burning feeling when you pee. This information is not intended to replace advice given to you by your health care provider. Make sure you discuss any questions you have with your health care provider. Document Released: 07/22/2008 Document Revised: 03/20/2016 Document Reviewed: 06/22/2015 Elsevier Interactive Patient Education  2018 [place] for Gastroesophageal Reflux Disease, Adult When you have gastroesophageal reflux disease (GERD), the foods you eat and your eating habits are very important. Choosing the right foods can help ease your discomfort. What guidelines do I need to follow?  Choose fruits, vegetables, whole grains, and low-fat dairy products.  Choose low-fat meat, fish, and poultry.  Limit fats such as oils, salad dressings, butter, nuts, and avocado.  Keep a food diary. This helps you identify foods that cause symptoms.  Avoid foods that cause symptoms. These may be different for everyone.  Eat small meals often instead of 3 large meals a day.  Eat your meals slowly, in a place where you are relaxed.  Limit fried foods.  Cook foods using methods other than frying.  Avoid drinking alcohol.  Avoid  drinking large amounts of liquids with your meals.  Avoid bending over or lying down until 2-3 hours after eating. What foods are not recommended? These are some foods and drinks that may make your symptoms worse: Vegetables Tomatoes. Tomato juice. Tomato and spaghetti sauce. Chili peppers. Onion and garlic. Horseradish. Fruits Oranges, grapefruit, and lemon (fruit and juice). Meats High-fat meats, fish, and poultry. This includes hot dogs, ribs, ham, sausage, salami, and bacon. Dairy Whole milk and chocolate milk. Sour cream. Cream. Butter. Ice cream. Cream cheese. Drinks Coffee and tea. Bubbly (carbonated) drinks or energy drinks. Condiments Hot sauce. Barbecue sauce. Sweets/Desserts Chocolate and cocoa. Donuts. Peppermint and spearmint. Fats and Oils High-fat foods. This includes [place] fries and potato chips. Other Vinegar. Strong spices. This includes black pepper, white  pepper, red pepper, cayenne, curry powder, cloves, ginger, and chili powder. The items listed above may not be a complete list of foods and drinks to avoid. Contact your dietitian for more information. This information is not intended to replace advice given to you by your health care provider. Make sure you discuss any questions you have with your health care provider. Document Released: 04/13/2012 Document Revised: 03/20/2016 Document Reviewed: 08/17/2013 Elsevier Interactive Patient Education  2017 [employer].
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
## 2017-08-06 NOTE — Progress Notes (Deleted)
  Subjective:    Andrea Buck is a 71 y.o. old female here ***  HPI Back and leg pain: sharp and burning pain. Sharp and shocking pain. Bilateral. No red flags. Gabapentin  PMH/Problem List: has GLOMUS TUMOR; OBESITY, NOS; DEPRESSION, MAJOR, RECURRENT; ANXIETY; HYPERTENSION, BENIGN SYSTEMIC; GASTROESOPHAGEAL REFLUX, NO ESOPHAGITIS; MENOPAUSAL SYNDROME; Arthropathy; INCONTINENCE, URGE; Vocal cord paralysis; Sleep disorder; Dyspnea; Acute bronchitis; Wrist pain, left; Back pain; Cough; Dyslipidemia; Right knee pain; Bilateral pseudophakia; Combined senile cataract; Conjunctivochalasis; Horner's syndrome; Myopia with astigmatism and presbyopia; Pharyngoesophageal dysphagia; Posterior vitreous detachment of both eyes; Ptosis of eyelid; Schatzki's ring; Sciatica; Pain of both hip joints; Neuropathic pain; Estrogen deficiency; Age-related osteoporosis without current pathological fracture; and Screening for hypothyroidism on her problem list.   has a past medical history of Blood transfusion; Bronchitis, chronic ([place]) (2005); Carpal tunnel syndrome (12/24/2006); CHOLELITHIASIS (04/25/2009); Depression; GASTROESOPHAGEAL REFLUX, NO ESOPHAGITIS (12/24/2006); GLOMUS TUMOR (2001); Hyperlipidemia; Hypertension; NECK MASS (12/29/2006); and Vocal cord paralysis (05/26/2011).  FH:  Family History  Problem Relation Age of Onset  . Cancer Mother        Uterus  . Diabetes Mother   . Hypertension Mother   . Heart disease Father   . Stroke Father   . Hypertension Father   . Diabetes Father   . Diabetes Sister   . Hypertension Sister   . Diabetes Brother   . Mental illness Son        PTSD  . Diabetes Son   . Hypertension Son   . Hyperlipidemia Son   . Obesity Son   . Drug abuse Son   . Diabetes Sister   . Hypertension Sister   . Diabetes Sister   . Hypertension Sister   . Multiple sclerosis Brother   . Diabetes Brother   . Hypertension Brother   . Arthritis Son   . Drug abuse Son   . Hyperlipidemia Son      [facility] Social History  Substance Use Topics  . Smoking status: Never Smoker  . Smokeless tobacco: Never Used  . Alcohol use No    Review of Systems Review of systems negative except for pertinent positives and negatives in history of present illness above.     Objective:     Vitals:   08/06/17 1440  BP: (!) 148/84  Pulse: 60  Temp: 98.3 F (36.8 C)  TempSrc: Oral  SpO2: 97%  Weight: 202 lb (91.6 kg)  Height: 5\' 5"  (1.651 m)   Body mass index is 33.61 kg/m.  Physical Exam GEN: appears well***, no apparent distress. Head: normocephalic and atraumatic  Eyes: conjunctiva without injection, sclera anicteric Oropharynx: mmm without erythema or exudation HEM: negative for cervical or periauricular lymphadenopathies ENDO: negative thyromegally *** CVS: RRR, nl S1&S2, no murmurs, no edema RESP: no IWOB, good air movement bilaterally, CTAB GI: BS present & normal, soft, NTND GU: no suprapubic or CVA tenderness*** MSK: no focal tenderness or notable swelling SKIN: no apparent skin lesion *** NEURO: alert and oiented appropriately, no gross deficits  PSYCH: euthymic mood with congruent affect ***    Assessment and Plan:     No Follow-up on file.  Mercy Riding, MD 08/06/17 Pager: [phone]

## 2017-08-27 ENCOUNTER — Ambulatory Visit: Payer: Medicare Other | Admitting: Student

## 2017-09-03 ENCOUNTER — Ambulatory Visit (INDEPENDENT_AMBULATORY_CARE_PROVIDER_SITE_OTHER): Payer: Medicare Other | Admitting: Student

## 2017-09-03 ENCOUNTER — Encounter: Payer: Self-pay | Admitting: Student

## 2017-09-03 ENCOUNTER — Other Ambulatory Visit: Payer: Self-pay

## 2017-09-03 VITALS — BP 128/66 | HR 77 | Temp 97.6°F | Ht 65.0 in | Wt 201.2 lb

## 2017-09-03 DIAGNOSIS — F419 Anxiety disorder, unspecified: Secondary | ICD-10-CM | POA: Diagnosis present

## 2017-09-03 DIAGNOSIS — M5417 Radiculopathy, lumbosacral region: Secondary | ICD-10-CM

## 2017-09-03 DIAGNOSIS — M25552 Pain in left hip: Secondary | ICD-10-CM

## 2017-09-03 DIAGNOSIS — M25551 Pain in right hip: Secondary | ICD-10-CM

## 2017-09-03 DIAGNOSIS — F329 Major depressive disorder, single episode, unspecified: Secondary | ICD-10-CM

## 2017-09-03 DIAGNOSIS — F32A Depression, unspecified: Secondary | ICD-10-CM

## 2017-09-03 MED ORDER — SERTRALINE HCL 100 MG PO TABS: 100.0000 mg | ORAL_TABLET | Freq: Every day | ORAL | 0 refills | Status: DC

## 2017-09-03 NOTE — Patient Instructions (Signed)
It was great seeing you today! We have addressed the following issues today  Leg and hip pain: Continue taking his gabapentin.  I ordered an x-ray of your back and hip.  You can have this done at [facility].  I also recommend daily walking as much as you can  Mood issue: We have sent a prescription for the Zoloft to the pharmacy.   If we did any lab work today, and the results require attention, either me or my nurse will get in touch with you. If everything is normal, you will get a letter in mail and a message via . If you don't hear from [place] in two weeks, please give [place] a call. Otherwise, we look forward to seeing you again at your next visit. If you have any questions or concerns before then, please call the clinic at [phone].  Please bring all your medications to every doctors visit  Sign up for My Chart to have easy access to your labs results, and communication with your Primary care physician.    Please check-out at the front desk before leaving the clinic.    Take Care,   Dr. Cyndia Skeeters
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
## 2017-09-22 ENCOUNTER — Telehealth: Payer: Self-pay | Admitting: *Deleted

## 2017-09-22 NOTE — Telephone Encounter (Signed)
Patient left message on nurse line requesting date of last colonoscopy. States ok to leave detailed message. No record of colonoscopy in Epic. Last stool test done in 2013. This info was left on patient's VM. Hubbard Hartshorn, RN, BSN

## 2017-10-08 ENCOUNTER — Ambulatory Visit (HOSPITAL_COMMUNITY)
Admission: RE | Admit: 2017-10-08 | Discharge: 2017-10-08 | Disposition: A | Discharge status: Home / Self Care | Payer: Medicare Other | Source: Ambulatory Visit | Attending: Family Medicine | Admitting: Family Medicine

## 2017-10-08 DIAGNOSIS — M545 Low back pain: Secondary | ICD-10-CM | POA: Diagnosis not present

## 2017-10-08 DIAGNOSIS — M25552 Pain in left hip: Secondary | ICD-10-CM | POA: Insufficient documentation

## 2017-10-08 DIAGNOSIS — M25551 Pain in right hip: Secondary | ICD-10-CM | POA: Insufficient documentation

## 2017-10-08 DIAGNOSIS — M5417 Radiculopathy, lumbosacral region: Secondary | ICD-10-CM | POA: Diagnosis not present

## 2017-11-09 ENCOUNTER — Telehealth: Payer: Self-pay | Admitting: Student

## 2017-11-09 DIAGNOSIS — M810 Age-related osteoporosis without current pathological fracture: Secondary | ICD-10-CM

## 2017-11-09 MED ORDER — ALENDRONATE SODIUM 70 MG PO TABS: ORAL_TABLET | ORAL | 11 refills | Status: DC

## 2017-11-09 NOTE — Telephone Encounter (Signed)
Called and discussed about changing ibandronate to alendronate.  Patient with osteoporosis in right femoral neck.  More data with alendronate than ibandronate for femoral neck osteoporosis.  Patient voiced understanding.  Agrees.  She appreciated the call.

## 2018-02-04 ENCOUNTER — Other Ambulatory Visit: Payer: Self-pay

## 2018-02-04 DIAGNOSIS — R05 Cough: Secondary | ICD-10-CM

## 2018-02-04 DIAGNOSIS — R059 Cough, unspecified: Secondary | ICD-10-CM

## 2018-02-04 DIAGNOSIS — I10 Essential (primary) hypertension: Secondary | ICD-10-CM

## 2018-02-04 MED ORDER — ALBUTEROL SULFATE HFA 108 (90 BASE) MCG/ACT IN AERS: 2.0000 | INHALATION_SPRAY | Freq: Four times a day (QID) | RESPIRATORY_TRACT | 0 refills | Status: DC | PRN

## 2018-02-04 MED ORDER — MOMETASONE FUROATE 50 MCG/ACT NA SUSP: 1.0000 | Freq: Every day | NASAL | 3 refills | Status: DC

## 2018-02-04 MED ORDER — HYDROCHLOROTHIAZIDE 25 MG PO TABS: 25.0000 mg | ORAL_TABLET | Freq: Every day | ORAL | 3 refills | Status: DC

## 2018-02-04 MED ORDER — CALCIUM CARBONATE-VITAMIN D3 600-400 MG-UNIT PO TABS: ORAL_TABLET | ORAL | 3 refills | Status: DC

## 2018-02-04 MED ORDER — FLUTICASONE-SALMETEROL 250-50 MCG/DOSE IN AEPB: 1.0000 | INHALATION_SPRAY | Freq: Two times a day (BID) | RESPIRATORY_TRACT | 1 refills | Status: DC

## 2018-02-04 MED ORDER — LOSARTAN POTASSIUM 25 MG PO TABS: 25.0000 mg | ORAL_TABLET | Freq: Every day | ORAL | 1 refills | Status: DC

## 2018-02-04 MED ORDER — GUAIFENESIN-CODEINE 100-10 MG/5ML PO SYRP: 5.0000 mL | ORAL_SOLUTION | Freq: Three times a day (TID) | ORAL | 0 refills | Status: DC | PRN

## 2018-02-04 MED ORDER — CETIRIZINE HCL 10 MG PO TABS: 10.0000 mg | ORAL_TABLET | Freq: Every day | ORAL | 11 refills | Status: DC

## 2018-02-10 ENCOUNTER — Other Ambulatory Visit: Payer: Self-pay

## 2018-02-10 DIAGNOSIS — F329 Major depressive disorder, single episode, unspecified: Secondary | ICD-10-CM

## 2018-02-10 DIAGNOSIS — F32A Depression, unspecified: Secondary | ICD-10-CM

## 2018-02-10 DIAGNOSIS — F411 Generalized anxiety disorder: Secondary | ICD-10-CM

## 2018-02-10 DIAGNOSIS — F419 Anxiety disorder, unspecified: Principal | ICD-10-CM

## 2018-02-10 MED ORDER — SERTRALINE HCL 100 MG PO TABS: 100.0000 mg | ORAL_TABLET | Freq: Every day | ORAL | 0 refills | Status: DC

## 2018-02-10 MED ORDER — BUSPIRONE HCL 5 MG PO TABS: 7.5000 mg | ORAL_TABLET | Freq: Two times a day (BID) | ORAL | 3 refills | Status: DC

## 2018-02-12 ENCOUNTER — Other Ambulatory Visit: Payer: Self-pay | Admitting: Student

## 2018-02-12 DIAGNOSIS — K219 Gastro-esophageal reflux disease without esophagitis: Secondary | ICD-10-CM

## 2018-03-24 ENCOUNTER — Other Ambulatory Visit: Payer: Self-pay | Admitting: Student

## 2018-03-24 DIAGNOSIS — F329 Major depressive disorder, single episode, unspecified: Secondary | ICD-10-CM

## 2018-03-24 DIAGNOSIS — F32A Depression, unspecified: Secondary | ICD-10-CM

## 2018-03-24 DIAGNOSIS — F419 Anxiety disorder, unspecified: Principal | ICD-10-CM

## 2018-04-01 ENCOUNTER — Other Ambulatory Visit: Payer: Self-pay

## 2018-04-01 DIAGNOSIS — M810 Age-related osteoporosis without current pathological fracture: Secondary | ICD-10-CM

## 2018-04-01 DIAGNOSIS — K219 Gastro-esophageal reflux disease without esophagitis: Secondary | ICD-10-CM

## 2018-04-01 MED ORDER — RANITIDINE HCL 150 MG PO TABS: ORAL_TABLET | ORAL | 10 refills | Status: DC

## 2018-04-01 MED ORDER — ALENDRONATE SODIUM 70 MG PO TABS: ORAL_TABLET | ORAL | 11 refills | Status: DC

## 2018-04-01 NOTE — Telephone Encounter (Signed)
Received fax from pharmacy requesting a 90 supply for pended medications. Please advise.

## 2018-04-07 DIAGNOSIS — J384 Edema of larynx: Secondary | ICD-10-CM | POA: Diagnosis not present

## 2018-04-07 DIAGNOSIS — Z9889 Other specified postprocedural states: Secondary | ICD-10-CM | POA: Diagnosis not present

## 2018-04-07 DIAGNOSIS — J3801 Paralysis of vocal cords and larynx, unilateral: Secondary | ICD-10-CM | POA: Diagnosis not present

## 2018-04-07 DIAGNOSIS — R1310 Dysphagia, unspecified: Secondary | ICD-10-CM | POA: Diagnosis not present

## 2018-04-07 DIAGNOSIS — R490 Dysphonia: Secondary | ICD-10-CM | POA: Diagnosis not present

## 2018-04-07 DIAGNOSIS — Z7722 Contact with and (suspected) exposure to environmental tobacco smoke (acute) (chronic): Secondary | ICD-10-CM | POA: Diagnosis not present

## 2018-04-07 DIAGNOSIS — K222 Esophageal obstruction: Secondary | ICD-10-CM | POA: Diagnosis not present

## 2018-04-12 DIAGNOSIS — D1800 Hemangioma unspecified site: Secondary | ICD-10-CM | POA: Diagnosis not present

## 2018-04-12 DIAGNOSIS — E042 Nontoxic multinodular goiter: Secondary | ICD-10-CM | POA: Diagnosis not present

## 2018-04-12 DIAGNOSIS — Z483 Aftercare following surgery for neoplasm: Secondary | ICD-10-CM | POA: Diagnosis not present

## 2018-04-12 DIAGNOSIS — R490 Dysphonia: Secondary | ICD-10-CM | POA: Diagnosis not present

## 2018-04-15 ENCOUNTER — Other Ambulatory Visit: Payer: Self-pay

## 2018-04-15 DIAGNOSIS — M25552 Pain in left hip: Principal | ICD-10-CM

## 2018-04-15 DIAGNOSIS — M25551 Pain in right hip: Secondary | ICD-10-CM

## 2018-04-17 MED ORDER — GABAPENTIN 300 MG PO CAPS: ORAL_CAPSULE | ORAL | 3 refills | Status: DC

## 2018-04-19 ENCOUNTER — Other Ambulatory Visit: Payer: Self-pay | Admitting: *Deleted

## 2018-04-19 DIAGNOSIS — I10 Essential (primary) hypertension: Secondary | ICD-10-CM

## 2018-04-19 MED ORDER — HYDROCHLOROTHIAZIDE 25 MG PO TABS: 25.0000 mg | ORAL_TABLET | Freq: Every day | ORAL | 3 refills | Status: DC

## 2018-04-19 MED ORDER — LOSARTAN POTASSIUM 25 MG PO TABS: 25.0000 mg | ORAL_TABLET | Freq: Every day | ORAL | 1 refills | Status: DC

## 2018-04-19 NOTE — Telephone Encounter (Signed)
Received fax from pharmacy requesting a 90 supply for pended medications.  Fleeger, Salome Spotted, CMA

## 2018-04-28 DIAGNOSIS — R490 Dysphonia: Secondary | ICD-10-CM | POA: Diagnosis not present

## 2018-04-28 DIAGNOSIS — J385 Laryngeal spasm: Secondary | ICD-10-CM | POA: Diagnosis not present

## 2018-04-28 DIAGNOSIS — J3801 Paralysis of vocal cords and larynx, unilateral: Secondary | ICD-10-CM | POA: Diagnosis not present

## 2018-05-13 ENCOUNTER — Telehealth: Payer: Self-pay | Admitting: Family Medicine

## 2018-05-13 DIAGNOSIS — J3801 Paralysis of vocal cords and larynx, unilateral: Secondary | ICD-10-CM | POA: Diagnosis not present

## 2018-05-13 DIAGNOSIS — R490 Dysphonia: Secondary | ICD-10-CM | POA: Diagnosis not present

## 2018-05-13 DIAGNOSIS — J385 Laryngeal spasm: Secondary | ICD-10-CM | POA: Diagnosis not present

## 2018-05-13 DIAGNOSIS — Z1211 Encounter for screening for malignant neoplasm of colon: Secondary | ICD-10-CM

## 2018-05-13 NOTE — Telephone Encounter (Signed)
Patient was told to get a colon cancer screening and is not sure how to go about doing that.  Please call her at [phone]

## 2018-05-17 NOTE — Telephone Encounter (Signed)
Will forward to MD to place referral for colonoscopy.  Jazmin Hartsell,CMA

## 2018-05-18 NOTE — Telephone Encounter (Signed)
Pt states that Dr. Jeannine Kitten tried to call her but "his phone was breaking up".  Will forward to him as Im not sure if/why he was calling. Hadrian Yarbrough, Salome Spotted, CMA

## 2018-05-18 NOTE — Telephone Encounter (Signed)
Called patient about colonoscopy.  She said she was called by someone from [facility] earlier in the week telling her she was due for a colonoscopy.  She states she has never had one before. I put in order for colonoscopy referral.  Please call patient and give her information on which providers she can schedule it with.   - Clemetine Marker

## 2018-05-21 NOTE — Telephone Encounter (Signed)
LVM to call office to let her know that the referral to GI for her colonoscopy has been placed and that she should here from someone there to get her scheduled.  Katharina Caper, April D, [place]

## 2018-05-21 NOTE — Addendum Note (Signed)
Addended by: Benay Pike on: 05/21/2018 01:46 PM   Modules accepted: Orders

## 2018-05-25 ENCOUNTER — Other Ambulatory Visit: Payer: Self-pay | Admitting: *Deleted

## 2018-05-25 DIAGNOSIS — F411 Generalized anxiety disorder: Secondary | ICD-10-CM

## 2018-05-25 DIAGNOSIS — R633 Feeding difficulties: Secondary | ICD-10-CM | POA: Diagnosis not present

## 2018-05-25 DIAGNOSIS — R1311 Dysphagia, oral phase: Secondary | ICD-10-CM | POA: Diagnosis not present

## 2018-05-26 MED ORDER — BUSPIRONE HCL 5 MG PO TABS: 7.5000 mg | ORAL_TABLET | Freq: Two times a day (BID) | ORAL | 3 refills | Status: DC

## 2018-06-01 ENCOUNTER — Encounter: Payer: Self-pay | Admitting: Gastroenterology

## 2018-07-09 ENCOUNTER — Telehealth: Payer: Self-pay | Admitting: *Deleted

## 2018-07-09 NOTE — Telephone Encounter (Signed)
John,  Could you please review this pt's hx?  I see she's had some vocal chord paralysis and some neck issues.  Is she ok for [facility]?  Thanks, [employer]

## 2018-07-09 NOTE — Telephone Encounter (Signed)
noted 

## 2018-07-09 NOTE — Telephone Encounter (Signed)
Kristen,  This pt is cleared for anesthetic care at [facility].  Thanks,  Camary Sosa 

## 2018-07-14 ENCOUNTER — Ambulatory Visit (AMBULATORY_SURGERY_CENTER): Payer: Self-pay | Admitting: *Deleted

## 2018-07-14 VITALS — Ht 65.0 in | Wt 193.0 lb

## 2018-07-14 DIAGNOSIS — Z1211 Encounter for screening for malignant neoplasm of colon: Secondary | ICD-10-CM

## 2018-07-14 NOTE — Progress Notes (Signed)
Patient denies any allergies to eggs or soy. Patient denies any problems with anesthesia/sedation. Patient denies any oxygen use at home. Patient denies taking any diet/weight loss medications or blood thinners. EMMI education assisgned to patient on colonoscopy, this was explained and instructions given to patient. 

## 2018-07-16 ENCOUNTER — Other Ambulatory Visit: Payer: Self-pay

## 2018-07-16 DIAGNOSIS — M25551 Pain in right hip: Secondary | ICD-10-CM

## 2018-07-16 DIAGNOSIS — I10 Essential (primary) hypertension: Secondary | ICD-10-CM

## 2018-07-16 DIAGNOSIS — M25552 Pain in left hip: Principal | ICD-10-CM

## 2018-07-20 MED ORDER — LOSARTAN POTASSIUM 25 MG PO TABS: 25.0000 mg | ORAL_TABLET | Freq: Every day | ORAL | 0 refills | Status: DC

## 2018-07-20 MED ORDER — GABAPENTIN 300 MG PO CAPS: ORAL_CAPSULE | ORAL | 0 refills | Status: DC

## 2018-07-20 NOTE — Telephone Encounter (Signed)
This patient's pharmacy is in [place] and her last visit at the clinic was in 2018.  Can we confirm she still lives here? I'd also like her to schedule an appointment with her to at least check a bmp before I prescribe her so many refills.  I will fill her prescription with no refills to give her time to make an appointment.

## 2018-07-21 NOTE — Telephone Encounter (Signed)
Exact care is a Psychologist, clinical.   She has an ATC appt tomorrow.  Will forward to that provider/CMA to get pt scheduled.  Yaeli Hartung, Salome Spotted, CMA

## 2018-07-22 ENCOUNTER — Ambulatory Visit (INDEPENDENT_AMBULATORY_CARE_PROVIDER_SITE_OTHER): Payer: Medicare Other | Admitting: Family Medicine

## 2018-07-22 VITALS — BP 130/65 | HR 68 | Temp 98.5°F | Wt 192.6 lb

## 2018-07-22 DIAGNOSIS — J301 Allergic rhinitis due to pollen: Secondary | ICD-10-CM

## 2018-07-22 DIAGNOSIS — N3001 Acute cystitis with hematuria: Secondary | ICD-10-CM

## 2018-07-22 DIAGNOSIS — Z23 Encounter for immunization: Secondary | ICD-10-CM

## 2018-07-22 DIAGNOSIS — R300 Dysuria: Secondary | ICD-10-CM

## 2018-07-22 HISTORY — DX: Acute cystitis with hematuria: N30.01

## 2018-07-22 LAB — POCT URINALYSIS DIP (MANUAL ENTRY)
BILIRUBIN UA: NEGATIVE
BILIRUBIN UA: NEGATIVE mg/dL
GLUCOSE UA: NEGATIVE mg/dL
Leukocytes, UA: NEGATIVE
Nitrite, UA: NEGATIVE
PROTEIN UA: NEGATIVE mg/dL
Spec Grav, UA: 1.015 (ref 1.010–1.025)
UROBILINOGEN UA: 0.2 U/dL
pH, UA: 6.5 (ref 5.0–8.0)

## 2018-07-22 LAB — POCT UA - MICROSCOPIC ONLY

## 2018-07-22 MED ORDER — PHENAZOPYRIDINE HCL 200 MG PO TABS: 200.0000 mg | ORAL_TABLET | Freq: Three times a day (TID) | ORAL | 0 refills | Status: DC | PRN

## 2018-07-22 MED ORDER — CEPHALEXIN 500 MG PO CAPS: 500.0000 mg | ORAL_CAPSULE | Freq: Four times a day (QID) | ORAL | 0 refills | Status: AC

## 2018-07-22 NOTE — Assessment & Plan Note (Signed)
Acute.  Symptoms consistent with seasonal allergies.  No signs of secondary bacterial infection. - Advised to begin over-the-counter antihistamine - Flu shot given

## 2018-07-22 NOTE — Progress Notes (Signed)
   Subjective   Patient ID: Andrea Buck    DOB: [birth year], 72 y.o. female   MRN: [id number]  CC: "I think I may have a urinary infection"  HPI: Andrea Buck is a 72 y.o. female who presents to clinic today for the following:  DYSURIA  Pain urinating started 5 days ago. Pain is: burning when urinates Medications tried: none Any antibiotics in the last 30 days: no More than 3 UTIs in the last 12 months: had 1 lifetime infection STD exposure: no Possibly pregnant: no  Symptoms Urgency: yes Frequency: yes Blood in urine: no Pain in back: no Fever: no Vaginal discharge: no Mouth Ulcers: no  URI  Has been sick for 14 days, got better Nasal discharge: was, no longer Medications tried: nebulizer for her bronchitis Sick contacts: no, works as Marine scientist though  Symptoms Fever: no Headache or face pain: dull frontal pain Tooth pain: no Sneezing: was Scratchy throat: yes Allergies: yes Muscle aches: no Severe fatigue: no Stiff neck: no Shortness of breath: some Rash: no Sore throat or swollen glands: no  ROS: see HPI for pertinent.  [name]: HTN, HLD, obesity, urge incontinence, MDD, GERD.  Surgical history tubal, glomus tumor removal, lumbar laminectomy, cholecystectomy, carpal tunnel release, TAH.  Family history DM, HTN, heart disease, stroke, MS.  Smoking status reviewed. Medications reviewed.  Objective   BP 130/65   Pulse 68   Temp 98.5 F (36.9 C) (Oral)   Wt 192 lb 9.6 oz (87.4 kg)   SpO2 97%   BMI 32.05 kg/m  Vitals and nursing note reviewed.  General: well nourished, well developed, NAD with non-toxic appearance HEENT: normocephalic, atraumatic, moist mucous membranes, swollen erythematous turbinates with increased clear discharge, erythematous conjunctiva bilaterally Neck: supple, non-tender without lymphadenopathy Cardiovascular: regular rate and rhythm without murmurs, rubs, or gallops Lungs: clear to auscultation bilaterally with normal work of  breathing Abdomen: soft, non-tender, non-distended, normoactive bowel sounds GU: no suprapubic tenderness, mild right flank pain present Skin: warm, dry, no rashes or lesions, cap refill < 2 seconds Extremities: warm and well perfused, normal tone, no edema  Assessment & Plan   Seasonal allergic rhinitis due to pollen Acute.  Symptoms consistent with seasonal allergies.  No signs of secondary bacterial infection. - Advised to begin over-the-counter antihistamine - Flu shot given  Acute cystitis with hematuria Acute.  Minimal RBCs 0-3 hpf.  UA without signs of infection though clinical presentation convincing with dysuria, urinary frequency and urgency.  Does have some right flank pain which could be a sign of pyelonephritis. - Initiating Keflex 500 mg 4 times daily for 5 days and given short course of Pyridium for symptomatic relief - Reviewed return precautions - Follow-up urine culture  Orders Placed This Encounter  Procedures  . Urine Culture  . Flu Vaccine QUAD 36+ mos IM  . POCT urinalysis dipstick  . POCT UA - Microscopic Only   Meds ordered this encounter  Medications  . cephALEXin (KEFLEX) 500 MG capsule    Sig: Take 1 capsule (500 mg total) by mouth 4 (four) times daily for 5 days.    Dispense:  20 capsule    Refill:  0  . phenazopyridine (PYRIDIUM) 200 MG tablet    Sig: Take 1 tablet (200 mg total) by mouth 3 (three) times daily as needed for pain.    Dispense:  10 tablet    Refill:  0    Harriet Butte, [place], PGY-3 07/22/2018, 4:39 PM

## 2018-07-22 NOTE — Patient Instructions (Signed)
Thank you for coming in to see Andrea Buck today. Please see below to review our plan for today's visit.  1.  I believe your upper respiratory symptoms is related to allergies.  Continue taking the Zyrtec and consider taking over-the-counter Flonase daily. 2.  Your urinary symptoms may be related to a urinary infection.  I will treat you with Keflex 4 times daily for 5 days.  I have also given you a few days of Pyridium to alleviate some of your symptoms.  Please call the clinic at [phone] if your symptoms worsen or you have any concerns. It was our pleasure to serve you.  Harriet Butte, [place], PGY-3

## 2018-07-22 NOTE — Assessment & Plan Note (Signed)
Acute.  Minimal RBCs 0-3 hpf.  UA without signs of infection though clinical presentation convincing with dysuria, urinary frequency and urgency.  Does have some right flank pain which could be a sign of pyelonephritis. - Initiating Keflex 500 mg 4 times daily for 5 days and given short course of Pyridium for symptomatic relief - Reviewed return precautions - Follow-up urine culture

## 2018-07-26 MED ORDER — GABAPENTIN 300 MG PO CAPS: ORAL_CAPSULE | ORAL | 3 refills | Status: DC

## 2018-07-26 MED ORDER — LOSARTAN POTASSIUM 25 MG PO TABS: 25.0000 mg | ORAL_TABLET | Freq: Every day | ORAL | 3 refills | Status: DC

## 2018-07-26 NOTE — Addendum Note (Signed)
Addended by: Benay Pike on: 07/26/2018 05:02 PM   Modules accepted: Orders

## 2018-07-28 ENCOUNTER — Encounter: Payer: Self-pay | Admitting: Gastroenterology

## 2018-07-28 ENCOUNTER — Ambulatory Visit (AMBULATORY_SURGERY_CENTER): Payer: Medicare Other | Admitting: Gastroenterology

## 2018-07-28 VITALS — BP 118/75 | HR 69 | Temp 96.8°F | Resp 16 | Ht 65.0 in | Wt 193.0 lb

## 2018-07-28 DIAGNOSIS — Z1211 Encounter for screening for malignant neoplasm of colon: Secondary | ICD-10-CM | POA: Diagnosis not present

## 2018-07-28 DIAGNOSIS — J449 Chronic obstructive pulmonary disease, unspecified: Secondary | ICD-10-CM | POA: Diagnosis not present

## 2018-07-28 DIAGNOSIS — D123 Benign neoplasm of transverse colon: Secondary | ICD-10-CM | POA: Diagnosis not present

## 2018-07-28 DIAGNOSIS — K219 Gastro-esophageal reflux disease without esophagitis: Secondary | ICD-10-CM | POA: Diagnosis not present

## 2018-07-28 DIAGNOSIS — K635 Polyp of colon: Secondary | ICD-10-CM | POA: Diagnosis not present

## 2018-07-28 DIAGNOSIS — D122 Benign neoplasm of ascending colon: Secondary | ICD-10-CM | POA: Diagnosis not present

## 2018-07-28 DIAGNOSIS — D124 Benign neoplasm of descending colon: Secondary | ICD-10-CM | POA: Diagnosis not present

## 2018-07-28 DIAGNOSIS — I10 Essential (primary) hypertension: Secondary | ICD-10-CM | POA: Diagnosis not present

## 2018-07-28 MED ORDER — SODIUM CHLORIDE 0.9 % IV SOLN: 500.0000 mL | Freq: Once | INTRAVENOUS | Status: DC

## 2018-07-28 NOTE — Progress Notes (Signed)
Called to room to assist during endoscopic procedure.  Patient ID and intended procedure confirmed with present staff. Received instructions for my participation in the procedure from the performing physician.  

## 2018-07-28 NOTE — Patient Instructions (Signed)
Please read handouts provided. Continue present medications.      YOU HAD AN ENDOSCOPIC PROCEDURE TODAY AT THE [facility]:   Refer to the procedure report that was given to you for any specific questions about what was found during the examination.  If the procedure report does not answer your questions, please call your gastroenterologist to clarify.  If you requested that your care partner not be given the details of your procedure findings, then the procedure report has been included in a sealed envelope for you to review at your convenience later.  YOU SHOULD EXPECT: Some feelings of bloating in the abdomen. Passage of more gas than usual.  Walking can help get rid of the air that was put into your GI tract during the procedure and reduce the bloating. If you had a lower endoscopy (such as a colonoscopy or flexible sigmoidoscopy) you may notice spotting of blood in your stool or on the toilet paper. If you underwent a bowel prep for your procedure, you may not have a normal bowel movement for a few days.  Please Note:  You might notice some irritation and congestion in your nose or some drainage.  This is from the oxygen used during your procedure.  There is no need for concern and it should clear up in a day or so.  SYMPTOMS TO REPORT IMMEDIATELY:   Following lower endoscopy (colonoscopy or flexible sigmoidoscopy):  Excessive amounts of blood in the stool  Significant tenderness or worsening of abdominal pains  Swelling of the abdomen that is new, acute  Fever of 100F or higher    For urgent or emergent issues, a gastroenterologist can be reached at any hour by calling [phone].   DIET:  We do recommend a small meal at first, but then you may proceed to your regular diet.  Drink plenty of fluids but you should avoid alcoholic beverages for 24 hours.  ACTIVITY:  You should plan to take it easy for the rest of today and you should NOT DRIVE or use heavy machinery  until tomorrow (because of the sedation medicines used during the test).    FOLLOW UP: Our staff will call the number listed on your records the next business day following your procedure to check on you and address any questions or concerns that you may have regarding the information given to you following your procedure. If we do not reach you, we will leave a message.  However, if you are feeling well and you are not experiencing any problems, there is no need to return our call.  We will assume that you have returned to your regular daily activities without incident.  If any biopsies were taken you will be contacted by phone or by letter within the next 1-3 weeks.  Please call us at [phone] if you have not heard about the biopsies in 3 weeks.    SIGNATURES/CONFIDENTIALITY: You and/or your care partner have signed paperwork which will be entered into your electronic medical record.  These signatures attest to the fact that that the information above on your After Visit Summary has been reviewed and is understood.  Full responsibility of the confidentiality of this discharge information lies with you and/or your care-partner. 

## 2018-07-28 NOTE — Progress Notes (Signed)
Pt's states no medical or surgical changes since previsit or office visit. 

## 2018-07-28 NOTE — Progress Notes (Signed)
Report to PACU, RN, vss, BBS= Clear.  

## 2018-07-28 NOTE — Op Note (Signed)
[place] Patient Name: Andrea Buck Procedure Date: 07/28/2018 10:47 AM MRN: [id number] Endoscopist: Justice Britain , MD Age: 72 Referring MD:  Date of Birth: [birth year] Gender: Female Account #: [id number] Procedure:                Colonoscopy Indications:              Screening for colorectal malignant neoplasm Medicines:                Monitored Anesthesia Care Procedure:                Pre-Anesthesia Assessment:                           - Prior to the procedure, a History and Physical                            was performed, and patient medications and                            allergies were reviewed. The patient's tolerance of                            previous anesthesia was also reviewed. The risks                            and benefits of the procedure and the sedation                            options and risks were discussed with the patient.                            All questions were answered, and informed consent                            was obtained. Prior Anticoagulants: The patient has                            taken no previous anticoagulant or antiplatelet                            agents. ASA Grade Assessment: II - A patient with                            mild systemic disease. After reviewing the risks                            and benefits, the patient was deemed in                            satisfactory condition to undergo the procedure.                           After obtaining informed consent, the colonoscope  was passed under direct vision. Throughout the                            procedure, the patient's blood pressure, pulse, and                            oxygen saturations were monitored continuously. The                            Colonoscope was introduced through the anus and                            advanced to the the cecum, identified by                            appendiceal orifice and  ileocecal valve. The                            colonoscopy was performed without difficulty. The                            patient tolerated the procedure. The quality of the                            bowel preparation was evaluated using the BBPS                            [facility] Bowel Preparation Scale) with scores of:                            Right Colon = 3, Transverse Colon = 3 and Left                            Colon = 3 (entire mucosa seen well with no residual                            staining, small fragments of stool or opaque                            liquid). The total BBPS score equals 9. Scope In: 10:54:04 AM Scope Out: 11:17:53 AM Scope Withdrawal Time: 0 hours 18 minutes 54 seconds  Total Procedure Duration: 0 hours 23 minutes 49 seconds  Findings:                 The digital rectal exam findings include                            non-thrombosed external hemorrhoids. Pertinent                            negatives include no palpable rectal lesions and                            normal prostate (size,  shape, and consistency).                           Six sessile polyps were found in the descending                            colon, transverse colon and ascending colon. The                            polyps were 2 to 6 mm in size. These polyps were                            removed with a cold snare. Resection and retrieval                            were complete.                           Multiple small-mouthed diverticula were found in                            the recto-sigmoid colon and sigmoid colon.                           Normal mucosa was found in the entire colon                            otherwise.                           Non-bleeding non-thrombosed external and internal                            hemorrhoids were found during retroflexion and                            during perianal exam. The hemorrhoids were Grade I                             (internal hemorrhoids that do not prolapse). Complications:            No immediate complications. Estimated Blood Loss:     Estimated blood loss was minimal. Impression:               - Non-thrombosed external hemorrhoids found on                            digital rectal exam.                           - Six 2 to 6 mm polyps in the descending colon, in                            the transverse colon and in the ascending colon,  removed with a cold snare. Resected and retrieved.                           - Diverticulosis in the recto-sigmoid colon and in                            the sigmoid colon.                           - Normal mucosa in the entire examined colon                            otherwise.                           - Non-bleeding non-thrombosed external and internal                            hemorrhoids. Recommendation:           - The patient will be observed post-procedure,                            until all discharge criteria are met.                           - Discharge patient to home.                           - Patient has a contact number available for                            emergencies. The signs and symptoms of potential                            delayed complications were discussed with the                            patient. Return to normal activities tomorrow.                            Written discharge instructions were provided to the                            patient.                           - High fiber diet.                           - Continue present medications.                           - Await pathology results.                           - Repeat colonoscopy in 3 years for surveillance  based on pathology results.                           - The findings and recommendations were discussed                            with the patient.                           - The findings and  recommendations were discussed                            with the patient's family. Justice Britain, MD 07/28/2018 11:23:34 AM

## 2018-07-28 NOTE — Progress Notes (Signed)
approx 1100 shortly after reaching cecum, pt noted to be wretching.  Suction readily available and intially clear "drool-like" fluid obtain.  Suddenly pt spit ou brown to pink colored fluid (not bile colored).  Most directly into suction.  pts head was dropped into steep t-burg and oropharynx cleared.  Sedation was suspended and pt was suctioned until awake enough to follow command to swallow.  After pt swallowed several times on own, sedation continued.  The total fluid of the clear and colored fluid did not fill the bottom of the cannister.  Event lasted approx 8 minutes

## 2018-07-29 ENCOUNTER — Telehealth: Payer: Self-pay | Admitting: *Deleted

## 2018-07-29 ENCOUNTER — Telehealth: Payer: Self-pay

## 2018-07-29 NOTE — Telephone Encounter (Signed)
Left message

## 2018-07-29 NOTE — Telephone Encounter (Signed)
  Follow up Call-  Call back number 07/28/2018  Post procedure Call Back phone  # [phone]  Permission to leave phone message Yes  Some recent data might be hidden     Patient questions:  Message left to call us if necessary.

## 2018-08-05 ENCOUNTER — Encounter: Payer: Self-pay | Admitting: Gastroenterology

## 2018-08-05 ENCOUNTER — Ambulatory Visit (INDEPENDENT_AMBULATORY_CARE_PROVIDER_SITE_OTHER): Payer: Medicare Other

## 2018-08-05 ENCOUNTER — Ambulatory Visit (INDEPENDENT_AMBULATORY_CARE_PROVIDER_SITE_OTHER): Payer: Medicare Other | Admitting: Licensed Clinical Social Worker

## 2018-08-05 VITALS — BP 140/80 | HR 53 | Temp 97.7°F | Ht 65.0 in | Wt 190.4 lb

## 2018-08-05 DIAGNOSIS — Z0000 Encounter for general adult medical examination without abnormal findings: Secondary | ICD-10-CM

## 2018-08-05 DIAGNOSIS — Z638 Other specified problems related to primary support group: Secondary | ICD-10-CM

## 2018-08-05 DIAGNOSIS — Z604 Social exclusion and rejection: Secondary | ICD-10-CM
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
## 2018-08-05 NOTE — Patient Instructions (Addendum)
Andrea Buck , Thank you for taking time to come for your Medicare Wellness Visit. I appreciate your ongoing commitment to your health goals. Please review the following plan we discussed and let me know if I can assist you in the future.   Please keep your appointment with Dr. Jeannine Kitten on 09/06/18 at 10:15. Casimer Lanius will follow back up with you about the things you discussed today.   These are the goals we discussed: Goals    . Exercise 3x per week (30 min per time)     Has been able to exercise once weekly.    . Increase water intake       This is a list of the screening recommended for you and due dates:  Health Maintenance  Topic Date Due  . Tetanus Vaccine  08/06/2019*  . Mammogram  01/01/2019  . Colon Cancer Screening  07/28/2028  . Flu Shot  Completed  . DEXA scan (bone density measurement)  Completed  .  Hepatitis C: One time screening is recommended by Center for Disease Control  (CDC) for  adults born from [birth year] through 1966.   Completed  . Pneumonia vaccines  Completed  *Topic was postponed. The date shown is not the original due date.    Fall Prevention in the Home Falls can cause injuries. They can happen to people of all ages. There are many things you can do to make your home safe and to help prevent falls. What can I do on the outside of my home?  Regularly fix the edges of walkways and driveways and fix any cracks.  Remove anything that might make you trip as you walk through a door, such as a raised step or threshold.  Trim any bushes or trees on the path to your home.  Use bright outdoor lighting.  Clear any walking paths of anything that might make someone trip, such as rocks or tools.  Regularly check to see if handrails are loose or broken. Make sure that both sides of any steps have handrails.  Any raised decks and porches should have guardrails on the edges.  Have any leaves, snow, or ice cleared regularly.  Use sand or salt on walking paths  during winter.  Clean up any spills in your garage right away. This includes oil or grease spills. What can I do in the bathroom?  Use night lights.  Install grab bars by the toilet and in the tub and shower. Do not use towel bars as grab bars.  Use non-skid mats or decals in the tub or shower.  If you need to sit down in the shower, use a plastic, non-slip stool.  Keep the floor dry. Clean up any water that spills on the floor as soon as it happens.  Remove soap buildup in the tub or shower regularly.  Attach bath mats securely with double-sided non-slip rug tape.  Do not have throw rugs and other things on the floor that can make you trip. What can I do in the bedroom?  Use night lights.  Make sure that you have a light by your bed that is easy to reach.  Do not use any sheets or blankets that are too big for your bed. They should not hang down onto the floor.  Have a firm chair that has side arms. You can use this for support while you get dressed.  Do not have throw rugs and other things on the floor that can make you trip. What  can I do in the kitchen?  Clean up any spills right away.  Avoid walking on wet floors.  Keep items that you use a lot in easy-to-reach places.  If you need to reach something above you, use a strong step stool that has a grab bar.  Keep electrical cords out of the way.  Do not use floor polish or wax that makes floors slippery. If you must use wax, use non-skid floor wax.  Do not have throw rugs and other things on the floor that can make you trip. What can I do with my stairs?  Do not leave any items on the stairs.  Make sure that there are handrails on both sides of the stairs and use them. Fix handrails that are broken or loose. Make sure that handrails are as long as the stairways.  Check any carpeting to make sure that it is firmly attached to the stairs. Fix any carpet that is loose or worn.  Avoid having throw rugs at the top  or bottom of the stairs. If you do have throw rugs, attach them to the floor with carpet tape.  Make sure that you have a light switch at the top of the stairs and the bottom of the stairs. If you do not have them, ask someone to add them for you. What else can I do to help prevent falls?  Wear shoes that: ? Do not have high heels. ? Have rubber bottoms. ? Are comfortable and fit you well. ? Are closed at the toe. Do not wear sandals.  If you use a stepladder: ? Make sure that it is fully opened. Do not climb a closed stepladder. ? Make sure that both sides of the stepladder are locked into place. ? Ask someone to hold it for you, if possible.  Clearly mark and make sure that you can see: ? Any grab bars or handrails. ? First and last steps. ? Where the edge of each step is.  Use tools that help you move around (mobility aids) if they are needed. These include: ? Canes. ? Walkers. ? Scooters. ? Crutches.  Turn on the lights when you go into a dark area. Replace any light bulbs as soon as they burn out.  Set up your furniture so you have a clear path. Avoid moving your furniture around.  If any of your floors are uneven, fix them.  If there are any pets around you, be aware of where they are.  Review your medicines with your doctor. Some medicines can make you feel dizzy. This can increase your chance of falling. Ask your doctor what other things that you can do to help prevent falls. This information is not intended to replace advice given to you by your health care provider. Make sure you discuss any questions you have with your health care provider. Document Released: 08/09/2009 Document Revised: 03/20/2016 Document Reviewed: 11/17/2014 Elsevier Interactive Patient Education  [employer].
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
## 2018-08-11 ENCOUNTER — Telehealth: Payer: Self-pay | Admitting: Licensed Clinical Social Worker

## 2018-08-11 NOTE — Progress Notes (Signed)
Type of Service: Clinical Social Work  [facility] intern phone call to client to follow up on her connection to [facility]. Clients son answered the call and gave the phone to the grandaughter who reported that the patient was asleep and would give her my number to call back later.   Plan: 1. [facility] intern will wait for client to return call. If no call is received [facility] intern will follow up in 5-7 business days.   Audry Riles, [place] intern Behavioral Health Clinician,  [place] [phone]

## 2018-09-06 ENCOUNTER — Ambulatory Visit (INDEPENDENT_AMBULATORY_CARE_PROVIDER_SITE_OTHER): Payer: Medicare Other | Admitting: Family Medicine

## 2018-09-06 ENCOUNTER — Other Ambulatory Visit: Payer: Self-pay

## 2018-09-06 ENCOUNTER — Encounter: Payer: Self-pay | Admitting: Family Medicine

## 2018-09-06 ENCOUNTER — Ambulatory Visit: Payer: Medicare Other | Admitting: Psychology

## 2018-09-06 VITALS — BP 124/66 | HR 62 | Temp 97.8°F | Ht 65.0 in | Wt 191.0 lb

## 2018-09-06 DIAGNOSIS — M150 Primary generalized (osteo)arthritis: Secondary | ICD-10-CM

## 2018-09-06 DIAGNOSIS — F331 Major depressive disorder, recurrent, moderate: Secondary | ICD-10-CM | POA: Diagnosis not present

## 2018-09-06 DIAGNOSIS — M159 Polyosteoarthritis, unspecified: Secondary | ICD-10-CM

## 2018-09-06 DIAGNOSIS — K219 Gastro-esophageal reflux disease without esophagitis: Secondary | ICD-10-CM | POA: Diagnosis not present

## 2018-09-06 DIAGNOSIS — J449 Chronic obstructive pulmonary disease, unspecified: Secondary | ICD-10-CM | POA: Diagnosis not present

## 2018-09-06 DIAGNOSIS — F419 Anxiety disorder, unspecified: Secondary | ICD-10-CM

## 2018-09-06 DIAGNOSIS — Z658 Other specified problems related to psychosocial circumstances: Secondary | ICD-10-CM | POA: Insufficient documentation

## 2018-09-06 DIAGNOSIS — F329 Major depressive disorder, single episode, unspecified: Secondary | ICD-10-CM | POA: Insufficient documentation

## 2018-09-06 DIAGNOSIS — F32A Depression, unspecified: Secondary | ICD-10-CM

## 2018-09-06 HISTORY — DX: Depression, unspecified: F32.A

## 2018-09-06 HISTORY — DX: Anxiety disorder, unspecified: F41.9

## 2018-09-06 MED ORDER — SERTRALINE HCL 100 MG PO TABS: 150.0000 mg | ORAL_TABLET | Freq: Every day | ORAL | 2 refills | Status: DC

## 2018-09-06 MED ORDER — MELOXICAM 7.5 MG PO TABS: 7.5000 mg | ORAL_TABLET | Freq: Every day | ORAL | 0 refills | Status: DC

## 2018-09-06 MED ORDER — FAMOTIDINE 10 MG PO TABS: 10.0000 mg | ORAL_TABLET | Freq: Two times a day (BID) | ORAL | 1 refills | Status: DC
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
## 2018-09-06 NOTE — Patient Instructions (Signed)
It was nice to meet you today.  I changed your zantac to pepcid.  You can take that twice a day before meals.  I started you back on mobic for your arthritis.  You can continue to take your tylenol while you take that.  I increased your zoloft to 150mg  daily.  It will be one and a half pills of what you currently take. I hope talking with Gennaro Africa helped you.  You can continue to use our behavioral health services as long as you need while you are a patient here.    Clemetine Marker, MD
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
## 2018-09-06 NOTE — Assessment & Plan Note (Signed)
Patient states she is still feeling depressed despite currently being on zoloft and buspar.  Scored an 18 on PHQ-9.  Mostly due to exernal stressors including family conflict. Initiated warm handoff with behavioral health and agreed to increased zoloft to 150mg /day.

## 2018-09-06 NOTE — Assessment & Plan Note (Signed)
Patient states tylenol is not helping and that mobic was working for her a year ago before she was taken off of it for unknown reasons.  Restarted mobic and told patient to continue taking her tylenol as well.

## 2018-09-06 NOTE — Assessment & Plan Note (Signed)
Patient would like to switch to another medication for GERD.  She was previously on omeprazole before zantac but was switched when she started taking alendronate. Discontinuing zantac and starting famotidine.
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
## 2018-09-07 NOTE — Addendum Note (Signed)
Addended by: Zella Ball E on: 09/07/2018 02:12 PM   Modules accepted: Level of Service

## 2018-09-08 ENCOUNTER — Telehealth: Payer: Self-pay | Admitting: Licensed Clinical Social Worker

## 2018-09-08 NOTE — Progress Notes (Signed)
Type of Service: Clinical Social Work  [facility] intern phone call to patient to discuss options of following up with [facility]. Patient was not available. [facility] intern left a message for patient with phone number for more information.  Plan:  1. [facility] intern will wait for a follow up call from patient.   Audry Riles, [place] intern Behavioral Health Clinician,  [place] [phone]

## 2018-09-22 NOTE — Progress Notes (Signed)
I have reviewed this visit and agree with the documentation.   

## 2018-10-29 ENCOUNTER — Other Ambulatory Visit: Payer: Self-pay | Admitting: Family Medicine

## 2019-03-03 ENCOUNTER — Other Ambulatory Visit: Payer: Self-pay | Admitting: Family Medicine

## 2019-03-12 ENCOUNTER — Other Ambulatory Visit: Payer: Self-pay | Admitting: Family Medicine

## 2019-04-22 DIAGNOSIS — H26492 Other secondary cataract, left eye: Secondary | ICD-10-CM | POA: Diagnosis not present

## 2019-04-22 DIAGNOSIS — Z961 Presence of intraocular lens: Secondary | ICD-10-CM | POA: Diagnosis not present

## 2019-04-27 DIAGNOSIS — H26492 Other secondary cataract, left eye: Secondary | ICD-10-CM | POA: Diagnosis not present

## 2019-05-20 ENCOUNTER — Other Ambulatory Visit: Payer: Self-pay | Admitting: Family Medicine

## 2019-05-20 DIAGNOSIS — Z1231 Encounter for screening mammogram for malignant neoplasm of breast: Secondary | ICD-10-CM

## 2019-06-08 ENCOUNTER — Encounter: Payer: Self-pay | Admitting: Family Medicine

## 2019-06-08 ENCOUNTER — Ambulatory Visit (INDEPENDENT_AMBULATORY_CARE_PROVIDER_SITE_OTHER): Payer: Medicare HMO | Admitting: Family Medicine

## 2019-06-08 ENCOUNTER — Other Ambulatory Visit: Payer: Self-pay

## 2019-06-08 VITALS — BP 138/78 | HR 64 | Ht 65.0 in | Wt 193.0 lb

## 2019-06-08 DIAGNOSIS — M150 Primary generalized (osteo)arthritis: Secondary | ICD-10-CM

## 2019-06-08 DIAGNOSIS — M159 Polyosteoarthritis, unspecified: Secondary | ICD-10-CM

## 2019-06-08 DIAGNOSIS — R7989 Other specified abnormal findings of blood chemistry: Secondary | ICD-10-CM

## 2019-06-08 DIAGNOSIS — M810 Age-related osteoporosis without current pathological fracture: Secondary | ICD-10-CM

## 2019-06-08 DIAGNOSIS — R69 Illness, unspecified: Secondary | ICD-10-CM | POA: Diagnosis not present

## 2019-06-08 DIAGNOSIS — F331 Major depressive disorder, recurrent, moderate: Secondary | ICD-10-CM

## 2019-06-08 DIAGNOSIS — E785 Hyperlipidemia, unspecified: Secondary | ICD-10-CM

## 2019-06-08 DIAGNOSIS — Z791 Long term (current) use of non-steroidal anti-inflammatories (NSAID): Secondary | ICD-10-CM | POA: Diagnosis not present

## 2019-06-08 MED ORDER — ALENDRONATE SODIUM 70 MG PO TABS: ORAL_TABLET | ORAL | 11 refills | Status: DC

## 2019-06-08 MED ORDER — BUPROPION HCL ER (SR) 150 MG PO TB12: 150.0000 mg | ORAL_TABLET | Freq: Two times a day (BID) | ORAL | 1 refills | Status: DC
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
## 2019-06-08 NOTE — Patient Instructions (Addendum)
I would like you to stop taking your BuSpar medication instead I would like you to take Wellbutrin, which is a better medication for depression and anxiety.  You can take this medication twice a day.  I would also like to see you back in 1 month so we can further discuss this and the results of your blood work and bone scan for osteoporosis.  I will tell Neoma Laming and our new psychologist Dr. Oren Binet about what we discussed today, and they can set up an appointment with you or provide you with other resources to help deal with the stress at home.  Have a great day,  Clemetine Marker, MD

## 2019-06-11 LAB — LIPID PANEL
Chol/HDL Ratio: 3.4 ratio (ref 0.0–4.4)
Cholesterol, Total: 191 mg/dL (ref 100–199)
HDL: 56 mg/dL (ref >39)
LDL Calculated: 115 mg/dL — ABNORMAL HIGH (ref 0–99)
Triglycerides: 100 mg/dL (ref 0–149)
VLDL Cholesterol Cal: 20 mg/dL (ref 5–40)

## 2019-06-11 LAB — BASIC METABOLIC PANEL
BUN/Creatinine Ratio: 22 (ref 12–28)
BUN: 17 mg/dL (ref 8–27)
CO2: 22 mmol/L (ref 20–29)
Calcium: 9.1 mg/dL (ref 8.7–10.3)
Chloride: 104 mmol/L (ref 96–106)
Creatinine, Ser: 0.79 mg/dL (ref 0.57–1.00)
GFR calc Af Amer: 86 mL/min/{1.73_m2} (ref >59)
GFR calc non Af Amer: 74 mL/min/{1.73_m2} (ref >59)
Glucose: 105 mg/dL — ABNORMAL HIGH (ref 65–99)
Potassium: 4.2 mmol/L (ref 3.5–5.2)
Sodium: 141 mmol/L (ref 134–144)

## 2019-06-11 LAB — INTACT PTH (INCLUDES CALCIUM)
Calcium, Serum: 9.1 mg/dL
PTH (Intact Assay): 123 pg/mL — ABNORMAL HIGH

## 2019-06-11 LAB — VITAMIN D 25 HYDROXY (VIT D DEFICIENCY, FRACTURES): Vit D, 25-Hydroxy: 15.3 ng/mL — ABNORMAL LOW (ref 30.0–100.0)

## 2019-06-12 NOTE — Assessment & Plan Note (Signed)
Patient taking Mobic 3-4 times a week.  No bleeding episodes. - BMP to check kidney function -Continue Mobic
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
## 2019-06-12 NOTE — Assessment & Plan Note (Signed)
Continue alendronate, calcium, vitamin D.  Last DEXA scan was 2 years ago.   PTH level in 2018 was elevated.  Calcium has been in the 9-10 range - Order repeat DEXA scan - Labs: Vitamin D, PTH

## 2019-06-15 ENCOUNTER — Telehealth: Payer: Self-pay | Admitting: Licensed Clinical Social Worker

## 2019-06-15 NOTE — Telephone Encounter (Signed)
   Unsuccessful Phone Outreach Note  06/15/2019 Name: [name] MRN: [id number] DOB: [birth year]  Referred by: Benay Pike, MD,  Reason for referral : Care Coordination and Stress ;.  [name] is a 73 y.o. year old female who sees Benay Pike, MD for primary care.  LCSW received referral to assist patient with possible legal and housing resources.    LCSW will also assess needs and barriers reference the above referral. Patient my benefit from ongoing therapy to assist with managing her stressors.  Telephone outreach was unsuccessful. A HIPPA compliant phone message was left for the patient providing contact information and requesting a return call.  Plan:  If no return call is received. LCSW will call again in 2 to 3 days.  Referral also sent to Care Guide for resource options.  Casimer Lanius, LCSW Clinical Social Worker [place] / [place]   [phone] 10:01 AM

## 2019-06-16 ENCOUNTER — Other Ambulatory Visit: Payer: Self-pay | Admitting: Family Medicine

## 2019-06-16 MED ORDER — VITAMIN D (ERGOCALCIFEROL) 1.25 MG (50000 UNIT) PO CAPS: 50000.0000 [IU] | ORAL_CAPSULE | ORAL | 0 refills | Status: DC

## 2019-06-17 NOTE — Telephone Encounter (Signed)
2nd Unsuccessful Phone Outreach Note  06/17/2019 Name: DIONISIA PORRES MRN: [id number] DOB: 1946-04-25  Referred by: Benay Pike, MD ,  Reason for referral : Care Coordination and Stress   AERIELLA LEMER is a 73 y.o. year old female who sees Benay Pike, MD for primary care. 2nd unsuccessful telephone outreach attempt to Ms. Ramond Marrow today.  A HIPPA compliant phone message was left for the patient providing contact information and requesting a return call.  Plan: LCSW will wait for return call, if no return call is received, will reach out to Ms. Ramond Marrow again over the next 7 days. If unable to reach Ms. Ramond Marrow by phone on the 3rd attempt, will discontinue outreach calls but will be available at any time to provide services to Ms. Ramond Marrow.   Casimer Lanius, LCSW Clinical Social Worker [place] / [place]   [phone] 12:39 PM

## 2019-06-23 ENCOUNTER — Other Ambulatory Visit: Payer: Self-pay

## 2019-06-23 ENCOUNTER — Ambulatory Visit: Payer: 59 | Admitting: Licensed Clinical Social Worker

## 2019-06-23 DIAGNOSIS — Z658 Other specified problems related to psychosocial circumstances: Secondary | ICD-10-CM

## 2019-06-23 DIAGNOSIS — F439 Reaction to severe stress, unspecified: Secondary | ICD-10-CM

## 2019-06-23 NOTE — BH Specialist Note (Signed)
   Care Coordination Social Work Note  06/23/2019 Name: Andrea Buck MRN: [id number] DOB: 06-08-1946  Andrea Buck is a 73 y.o. year old female who sees Benay Pike, MD for primary care. LCSW was consulted for assistance with [employer] .  Called patient to assess needs and barriers reference the above referral  Assessment : Patient is currently experiencing stress at home with her son and grandson living with her. She wants them out and has not been successful.  They have been living with her for 7 years.  Due to concerns with Covid she is no longer willing to continue to allow them to live in her home. Patient will be moving in with her daughter in September. She continues to work several days a week and reports this is an outlet for her as her source of stress it at home. Patient continues to take Wellbutrin and Sertraline.  Admits to missing does at times.  Recommendation: Patient may benefit from and is in agreement to Therapist, music Hotline for legal counsel about her home as well as her insurance provider for a list of in-network counselors.  Goals:  1. Connect with ongoing counseling  2. Move out of her home Interventions:Patient interviewed and appropriate assessments performed Provided mental health counseling with regard to managing stress (mental health diagnosis or concern) Provided patient with information about Senior Legal Helpline Advised patient to call resources provided Other interventions: . Solution-Focused Strategies,   . Problem-solving teaching/coping strategies  Plan: 1. SW will follow up with patient by phone over the next 2 weeks  2. Patient will call LCSW with days that she is off of work to schedule appointment  Dr. Jeannine Kitten has been informed of this outreach and plan.  Casimer Lanius, LCSW Clinical Social Worker [place] / [place]   [phone] 3:04 PM

## 2019-06-27 ENCOUNTER — Telehealth: Payer: Self-pay

## 2019-06-27 NOTE — Telephone Encounter (Signed)
Called and LVM for pt.  Told her to call back tomorrow during business hours to discuss pain.

## 2019-06-27 NOTE — Telephone Encounter (Signed)
Patient calls nurse line stating she still is in a lot of pain and Tylenol is not working. Patient is requesting something stronger and an order for an xray. I advised patient she should be seen again, patient declined as she is scheduled to come in 9/11 for FU. Please advise.

## 2019-07-01 NOTE — Telephone Encounter (Signed)
Patient returning call to [facility]. Patient stated she will wait until her apt on 09/11 to discuss pain.

## 2019-07-07 ENCOUNTER — Ambulatory Visit
Admission: RE | Admit: 2019-07-07 | Discharge: 2019-07-07 | Disposition: A | Discharge status: Home / Self Care | Payer: 59 | Source: Ambulatory Visit | Attending: Family Medicine | Admitting: Family Medicine

## 2019-07-07 ENCOUNTER — Other Ambulatory Visit: Payer: Self-pay

## 2019-07-07 DIAGNOSIS — Z1231 Encounter for screening mammogram for malignant neoplasm of breast: Secondary | ICD-10-CM

## 2019-07-08 ENCOUNTER — Ambulatory Visit: Payer: 59 | Admitting: Family Medicine

## 2019-07-12 ENCOUNTER — Ambulatory Visit (INDEPENDENT_AMBULATORY_CARE_PROVIDER_SITE_OTHER): Payer: Medicare HMO | Admitting: Family Medicine

## 2019-07-12 ENCOUNTER — Encounter: Payer: Self-pay | Admitting: Family Medicine

## 2019-07-12 ENCOUNTER — Other Ambulatory Visit: Payer: Self-pay

## 2019-07-12 VITALS — BP 150/64 | HR 60 | Ht 65.0 in | Wt 196.5 lb

## 2019-07-12 DIAGNOSIS — R69 Illness, unspecified: Secondary | ICD-10-CM | POA: Diagnosis not present

## 2019-07-12 DIAGNOSIS — M792 Neuralgia and neuritis, unspecified: Secondary | ICD-10-CM | POA: Diagnosis not present

## 2019-07-12 DIAGNOSIS — M25552 Pain in left hip: Secondary | ICD-10-CM

## 2019-07-12 DIAGNOSIS — M25551 Pain in right hip: Secondary | ICD-10-CM

## 2019-07-12 DIAGNOSIS — G8929 Other chronic pain: Secondary | ICD-10-CM | POA: Diagnosis not present

## 2019-07-12 DIAGNOSIS — Z23 Encounter for immunization: Secondary | ICD-10-CM

## 2019-07-12 DIAGNOSIS — F331 Major depressive disorder, recurrent, moderate: Secondary | ICD-10-CM

## 2019-07-12 DIAGNOSIS — M150 Primary generalized (osteo)arthritis: Secondary | ICD-10-CM | POA: Diagnosis not present

## 2019-07-12 DIAGNOSIS — M159 Polyosteoarthritis, unspecified: Secondary | ICD-10-CM

## 2019-07-12 DIAGNOSIS — M25511 Pain in right shoulder: Secondary | ICD-10-CM

## 2019-07-12 MED ORDER — GABAPENTIN 100 MG PO CAPS: 100.0000 mg | ORAL_CAPSULE | Freq: Two times a day (BID) | ORAL | 3 refills | Status: DC

## 2019-07-12 MED ORDER — GABAPENTIN 300 MG PO CAPS: ORAL_CAPSULE | ORAL | 3 refills | Status: DC

## 2019-07-12 NOTE — Patient Instructions (Addendum)
Senior Legal Hotline [phone] Hrs Mon- Friday 9-11 & 1-3pm Free legal help for [employer] 74 years of age or older. No income limitations.   For your shoulder pain -someone will call you about a referral for physical therapy.  I would like you to take 2 of your 7.5 mg meloxicam tablets daily for the next 2 weeks to see if that can prevent you from having to take the ibuprofen.  Please not take ibuprofen while you are taking the meloxicam.  I would also like you to take 100 mg of gabapentin in the morning and at lunch, and then continue taking your 600 mg at night.  See if this lower dose makes you less groggy during the day.  Wellbutrin- I would like you to try taking it again.  Sometimes when people have side effects the first time, if they stop taking it and then try taking in the side effects do not recur.  If you do have side effects such as itching, please stop taking the medication we will discuss adding another medication or changing the dose on the medications you are to take.  I have put the number for the senior legal hotline above.  I have also included a sheet that includes other legal resources for you.  I would like to see you back in 1 month.  Have a great day,   Clemetine Marker, MD

## 2019-07-12 NOTE — Progress Notes (Deleted)
Takes 300, 300, 600.

## 2019-07-12 NOTE — Progress Notes (Signed)
   [facility] Phone: [phone]     Andrea Buck - 73 y.o. female MRN [id number]  Date of birth: 04/09/1946  Subjective:   cc: right shoulder pain, depression  HPI:  Right shoulder: taking meloxicam 7.5mg .  Puts aspercreme patch on there, volteren gel on there.  It used to be a chronic now the pain is intermittent and unexpected, coming ono suddenly, often without warning. Takes Ibuprofen 800mg  at a time as needed.  Last took 3-4 days ago. Takes meloxicam in the evening unless she had taken ibuprofen that day. Patient is aware they are both NSAIDs. Patient is experiencing a Tingling down her right arm.  Sometimes it goes numb.  This has been going on several years and she initially thougt it was carpal tunnel but she Was still having pain after wrist surgery.  Patient states an mri was done years ago and they found 'disc problems'.  Doesn't want more surgery. Patient taking gabapentin 300/300/600, but doesn't always take the morning and afternoon dose and never both on the same day because it makes her too groggy.    Depression/environmental stress: doesn't know the number to the senior legal hotline.  She is going to talk to [employer], whom she is consulting on another issue, about her issues with her family.  Yolanda Bonine is still living with her, but son has left.grandson still talking to himself and having auditory hallucinations.  Bupropion made her itch so she stopped taking it after a week.     ROS: See HPI for pertinent positives and negatives  Family history reviewed for today's visit. No changes.  Social history- patient is a  nonsmoker  Objective:   BP (!) 150/64   Pulse 60   Ht 5\' 5"  (1.651 m)   Wt 196 lb 8 oz (89.1 kg)   SpO2 100%   BMI 32.70 kg/m  Gen: NAD, alert and oriented, cooperative with exam Neck: FROM, supple, no masses CV: normal rate, regular rhythm. No murmurs, no rubs.  Resp: LCTAB, no wheezes, crackles. normal work of breathing  Msk: limited ROM of right arm with abduction. Full passive ROM. No TTP of right shoulder.   Psych: Appropriate behavior  Assessment/Plan:   Osteoarthritis - advised pt to try 15mg  meloxicam daily to see if that allows her to stop taking the ibuprofen.  If pt still needing ibuprofen, she was advised to go bqck to 7.5mg  meloxicam.   -ambulatory referral to physical therapy  Neuropathic pain Prescribed 300/300/600 gabapentin.  Not taking all doses d/t side effects, I.e. drowsiness.  - switch to 100/100/600 to see if that give some relief without the drowsiness.   Major depressive disorder, recurrent episode ([place]) - gave pt infomrmation for legal resources.   - pt agreed to try bupropion again.   - need to consider alternative agents if not able to tolerate. - best thing for her stress/depression is to get her out of stressful social situation at home.   Clemetine Marker, MD PGY-2 [facility] Family Medicine Residency

## 2019-07-15 NOTE — Assessment & Plan Note (Signed)
-   gave pt infomrmation for legal resources.   - pt agreed to try bupropion again.   - need to consider alternative agents if not able to tolerate. - best thing for her stress/depression is to get her out of stressful social situation at home.

## 2019-07-15 NOTE — Assessment & Plan Note (Signed)
-   advised pt to try 15mg  meloxicam daily to see if that allows her to stop taking the ibuprofen.  If pt still needing ibuprofen, she was advised to go bqck to 7.5mg  meloxicam.   -ambulatory referral to physical therapy

## 2019-07-15 NOTE — Assessment & Plan Note (Signed)
Prescribed 300/300/600 gabapentin.  Not taking all doses d/t side effects, I.e. drowsiness.  - switch to 100/100/600 to see if that give some relief without the drowsiness.

## 2019-07-28 ENCOUNTER — Telehealth: Payer: Self-pay | Admitting: *Deleted

## 2019-07-28 NOTE — Telephone Encounter (Signed)
LM for patient to call back.  Will try to contact again.  Jazmin Hartsell,CMA

## 2019-07-28 NOTE — Telephone Encounter (Signed)
-----   Message from Benay Pike, MD sent at 06/27/2019  5:12 PM EDT ----- Regarding: geri clinic [place],   Can you schedule this patient for [facility] clinic?  I've talked with Dr. McDiarmid and he is okay with it.    Thanks,   Linna Hoff

## 2019-08-03 NOTE — Telephone Encounter (Signed)
Made note on upcoming appt with PCP for cma to find me so I can schedule this patient for a GERI and provide her the paperwork.

## 2019-08-12 ENCOUNTER — Encounter: Payer: Self-pay | Admitting: Family Medicine

## 2019-08-12 ENCOUNTER — Other Ambulatory Visit: Payer: Self-pay

## 2019-08-12 ENCOUNTER — Ambulatory Visit (INDEPENDENT_AMBULATORY_CARE_PROVIDER_SITE_OTHER): Payer: Medicare HMO | Admitting: Family Medicine

## 2019-08-12 VITALS — BP 140/86 | HR 67 | Wt 197.0 lb

## 2019-08-12 DIAGNOSIS — F331 Major depressive disorder, recurrent, moderate: Secondary | ICD-10-CM

## 2019-08-12 DIAGNOSIS — R69 Illness, unspecified: Secondary | ICD-10-CM | POA: Diagnosis not present

## 2019-08-12 DIAGNOSIS — M19011 Primary osteoarthritis, right shoulder: Secondary | ICD-10-CM | POA: Diagnosis not present

## 2019-08-12 DIAGNOSIS — J3800 Paralysis of vocal cords and larynx, unspecified: Secondary | ICD-10-CM

## 2019-08-12 NOTE — [place] Notes (Signed)
   [facility] Phone: [phone]     Andrea Buck - 73 y.o. female MRN [id number]  Date of birth: 1946/09/05  Subjective:   cc: shoulder pain, depression  HPI:  Shoulder pain: better with 15mg  meloxicam. Not taking ibuprofen anymore.   sometiems puts lidocaine patch on it.  votaren gel too. Current Gabapentin dose doesn't make her drowsy/fatigued  stressful Family situation: talked to attorney last week regarding what to do about getting her family to move out of her house. .  They recommneded restraining order and eviction and contact social services. She is plannign on going through with it.     Depression: startedTaking loratidine with the buproprion and she didn't have itching.  No difficulty breathing after taking medication.   thinks her mood has improved since taking bupropion.  Feels better overall.  Still has that 'thorn in her side' in reference to her grandson and other family members who won't leave her house. Believes she will feel much better after she is living on her own.  Vocal cord paralysis: last time she saw the ent was a year ago.doesn't feel like going there helped her.  Always had difficulty swallowing long before she noticed any changes to her voice.  Volume of voice has decreased since she last saw them.    ROS: See HPI for pertinent positives and negatives  Past Medical History reviewed.   Family history reviewed for today's visit. No changes.  Social history- patient is a non smoker   Objective:   BP 140/86   Pulse 67   Wt 197 lb (89.4 kg)   SpO2 97%   BMI 32.78 kg/m  Gen: NAD, alert and oriented, cooperative with exam CV: normal rate, regular rhythm. No murmurs, no rubs.  Resp: LCTAB, no wheezes, crackles. normal work of breathing Msk: right side: positive hawkins test, negative empty can. Neer test shows slightly stiffened ROM throughout the entire arc.   Psych: Appropriate behavior. Affect less restricted from previous  visit.   Assessment/Plan:   Major depressive disorder, recurrent episode ([place]) Patient feeling better since she is started taking bupropion.  She is taking loratadine with the bupropion to minimize itching.  This is working for her.  Her PHQ was 14, which is improved from last time.  Stressful social situation is also a factor, but that appears to be improving. -Continue bupropion and loratadine -Follow-up in 3 months regarding social situation  Osteoarthritis Shoulder pain has improved since increasing meloxicam to 15 mg.  She has not had to use ibuprofen in that time.  Positive Hawkins test. - X-ray right shoulder - Advised patient initiate physical therapy, she states she will after the x-ray. - Advised patient she can use topical treatments up to 4 times a day and that these have the safest.  Vocal cord paralysis Has not seen anybody for this issue in least 1 year.  States she does not desire to have surgery, as she does not do well with anesthesia.  States her voice feels softer and she is always had difficulty swallowing. -No changes.  If patient desires further work-up, will revisit in the future.  Does not appear to be affecting patient's quality of living.   Clemetine Marker, MD PGY-2 [facility] Family Medicine Residency

## 2019-08-12 NOTE — Assessment & Plan Note (Signed)
Has not seen anybody for this issue in least 1 year.  States she does not desire to have surgery, as she does not do well with anesthesia.  States her voice feels softer and she is always had difficulty swallowing. -No changes.  If patient desires further work-up, will revisit in the future.  Does not appear to be affecting patient's quality of living.

## 2019-08-12 NOTE — Patient Instructions (Addendum)
It was great to see you again today,  Things we talked about today: - We did not make any changes to your medication. -You can continue taking the loratadine with the bupropion.  If you develop any rash or trouble breathing, he should let us know and stop taking the bupropion - I have ordered an x-ray, you can go to [facility] imaging and get it done anytime during there business hours.  It would probably best to call them ahead of time to let them know you are coming. - I would like to see you back in 3 months.  Have a great day,  Clemetine Marker, MD

## 2019-08-12 NOTE — Assessment & Plan Note (Signed)
Patient feeling better since she is started taking bupropion.  She is taking loratadine with the bupropion to minimize itching.  This is working for her.  Her PHQ was 14, which is improved from last time.  Stressful social situation is also a factor, but that appears to be improving. -Continue bupropion and loratadine -Follow-up in 3 months regarding social situation

## 2019-08-12 NOTE — Assessment & Plan Note (Signed)
Shoulder pain has improved since increasing meloxicam to 15 mg.  She has not had to use ibuprofen in that time.  Positive Hawkins test. - X-ray right shoulder - Advised patient initiate physical therapy, she states she will after the x-ray. - Advised patient she can use topical treatments up to 4 times a day and that these have the safest.

## 2019-08-19 ENCOUNTER — Telehealth: Payer: Self-pay | Admitting: Licensed Clinical Social Worker

## 2019-08-19 NOTE — Telephone Encounter (Signed)
   Care Coordination Follow up Phone Note Social Work   08/19/2019 Name: AHNIKA HEAPHY MRN: [id number] DOB: 05-Feb-1946  NELYA COGDELL is a 73 y.o. year old female who sees Benay Pike, MD for primary care.  Reason for follow-up: phone encounter with patient today for ongoing assessment of needs and barriers.  Patient reports:things are going ok for her, she still has a few things she needs to work on and is doing that.  Denies any specific needs or assistance. Confirms that she has phone number for LCSW. Patient appreciative of the the follow up call. Plan:  1. Client will call office and LCSW as needed 2. No further follow up required: by LCSW at this time   Casimer Lanius, [place] / [place]   [phone] 2:00 PM

## 2019-08-23 ENCOUNTER — Ambulatory Visit (HOSPITAL_COMMUNITY)
Admission: RE | Admit: 2019-08-23 | Discharge: 2019-08-23 | Disposition: A | Discharge status: Home / Self Care | Payer: Medicare HMO | Source: Ambulatory Visit | Attending: Family Medicine | Admitting: Family Medicine

## 2019-08-23 ENCOUNTER — Other Ambulatory Visit: Payer: Self-pay

## 2019-08-23 DIAGNOSIS — M25511 Pain in right shoulder: Secondary | ICD-10-CM | POA: Diagnosis not present

## 2019-08-23 DIAGNOSIS — M19011 Primary osteoarthritis, right shoulder: Secondary | ICD-10-CM | POA: Diagnosis not present

## 2019-08-25 ENCOUNTER — Other Ambulatory Visit: Payer: Self-pay

## 2019-08-25 ENCOUNTER — Ambulatory Visit
Admission: RE | Admit: 2019-08-25 | Discharge: 2019-08-25 | Disposition: A | Discharge status: Home / Self Care | Payer: 59 | Source: Ambulatory Visit | Attending: Family Medicine | Admitting: Family Medicine

## 2019-08-25 DIAGNOSIS — M810 Age-related osteoporosis without current pathological fracture: Secondary | ICD-10-CM

## 2019-08-25 DIAGNOSIS — M85851 Other specified disorders of bone density and structure, right thigh: Secondary | ICD-10-CM | POA: Diagnosis not present

## 2019-08-25 DIAGNOSIS — Z780 Asymptomatic menopausal state: Secondary | ICD-10-CM | POA: Diagnosis not present

## 2019-09-28 ENCOUNTER — Telehealth: Payer: Self-pay | Admitting: Family Medicine

## 2019-09-28 NOTE — Telephone Encounter (Signed)
° ° °  Called pt regarding Community Resource Referral for housing [place] [employer] ??[email or web address]   ??[id number]

## 2019-09-30 NOTE — Telephone Encounter (Signed)
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 Her grandson moved out of her house recently and she is grateful Her son does still live w/ her and she is concerned because he comes and goes and is displaying risky behavior in regards to COVID-19.  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 She has contacted Legal Aid and followed through with DSS adult protective services regarding grandson but ended up having him move out before she needed their help. She has spoken with her son about her concerns and she is considering moving in with her daughter, she has listed her home for sale. Stated that the situation is stable at the moment and that once she sells her home she will feel better she is very close with her daughter Maryelizabeth Kaufmann.   Gave my contact information to Ms. Russin should she need it in the future.  [place] Management ??[email or web address]   ??[id number]

## 2019-11-18 ENCOUNTER — Other Ambulatory Visit: Payer: Self-pay

## 2019-11-18 ENCOUNTER — Encounter: Payer: Self-pay | Admitting: Family Medicine

## 2019-11-18 ENCOUNTER — Ambulatory Visit (INDEPENDENT_AMBULATORY_CARE_PROVIDER_SITE_OTHER): Payer: Medicare HMO | Admitting: Family Medicine

## 2019-11-18 VITALS — BP 132/78 | HR 77 | Wt 187.0 lb

## 2019-11-18 DIAGNOSIS — J449 Chronic obstructive pulmonary disease, unspecified: Secondary | ICD-10-CM

## 2019-11-18 DIAGNOSIS — M25511 Pain in right shoulder: Secondary | ICD-10-CM | POA: Diagnosis not present

## 2019-11-18 DIAGNOSIS — Z8616 Personal history of COVID-19: Secondary | ICD-10-CM

## 2019-11-18 DIAGNOSIS — F331 Major depressive disorder, recurrent, moderate: Secondary | ICD-10-CM

## 2019-11-18 DIAGNOSIS — G8929 Other chronic pain: Secondary | ICD-10-CM

## 2019-11-18 DIAGNOSIS — M810 Age-related osteoporosis without current pathological fracture: Secondary | ICD-10-CM | POA: Diagnosis not present

## 2019-11-18 MED ORDER — MELOXICAM 15 MG PO TABS: 15.0000 mg | ORAL_TABLET | Freq: Every day | ORAL | 0 refills | Status: DC

## 2019-11-18 MED ORDER — FLUTICASONE-SALMETEROL 250-50 MCG/DOSE IN AEPB: 1.0000 | INHALATION_SPRAY | Freq: Two times a day (BID) | RESPIRATORY_TRACT | 1 refills | Status: DC

## 2019-11-18 NOTE — Progress Notes (Signed)
Andrea Buck Phone: [phone]     Andrea Buck - 74 y.o. female MRN [id number]  Date of birth: May 01, 1946  Subjective:   cc: Depression, history of Covid  HPI:  Depression: Patient states her grandson moved out of her house, but still "comes around" on time when he gets kicked out of other places.  Patient is currently in the process of moving out of her house and buying a new place.  Will be moving in with her daughter prior to fixing up her new place.  She met a person who is a real estate agent who has helped her throughout this process.  She still feels anxious though and still complains of difficulty falling asleep, but overall she feels like she is in a much better place than her previous visit.  Covid: Patient works at nursing facility and states that this made a break of coronavirus.  She tested positive for coronavirus a month ago.  And symptoms include fatigue, loss of taste, diarrhea, cough, brain fog, chest pressure.  Those symptoms have either improved or resolved, but she still feels like she gets fatigued easily.  States she is scheduled to go back to work next Wednesday.  COPD: Patient has run out of her Advair.  She states when she had coronavirus she was using her albuterol nebulizer up to 3 times a day.  She has not used it though in the last few weeks.  Right shoulder pain.:  Her right shoulder pain is unchanged.  Patient has not worked at her physically demanding job in almost a month due to positive coronavirus infection.  She has been out of her Mobic in the past month.  She never went to physical therapy.  Osteoporosis: Patient taking her Fosamax and vitamin D q. weekly.  She would like to know she needs to keep taking the vitamin D.  ROS: See HPI for pertinent positives and negatives  Family history reviewed for today's visit. No changes.   Objective:   BP 132/78   Pulse 77   Wt 187 lb (84.8 kg)   SpO2 97%   BMI 31.12 kg/m  Gen:  Alert, oriented.  No acute distress.  Appears stated age.  Purple highlights in hair. CV: Regular rhythm, normal rate.  No murmurs.  2+ radial pulse bilaterally. Resp: Lungs clear to auscultation bilaterally.,  No wheezes, no crackles.  No respiratory distress. MSK: No tenderness palpation of the right shoulder, no swelling.  Normal active range of motion.  Hawkins positive, empty can test positive for pain and weakness on the right side.  Belly press negative, speeds negative, supination negative. Psych: Pleasant affect.  Makes eye contact.  Spontaneous speech.  Voice normal speed and volume.  Assessment/Plan:   Major depressive disorder, recurrent episode ([place]) Patient no longer taking bupropion.  Is taking sertraline.  Is on the path to greatly reducing her external stressors by selling her old house and moving in to a new house by herself. -Continue sertraline -Follow-up 3 months  Chronic right shoulder pain No change in chronic right shoulder pain.  X-ray showed mild arthritic changes.  Hawkins test positive, empty can test positive for weakness and pain.  Patient probably has a partial-thickness tear of her supraspinatus.  Has not taken her Mobic in over a month.  Did not go to PT. -Referral to sports medicine -2 weeks of Mobic  COPD (chronic obstructive pulmonary disease) ([place]) Does not have Advair.  Not taking will be.  Had to rely on albuterol nebulizer multiple times a day.  Covid infection.  Has not had to use albuterol inhaler in the last few weeks. -Refill Advair  Age-related osteoporosis without current pathological fracture Repeat DEXA scan showed T score -1.1.  Has been on bisphosphonate therapy for over a year.  Vitamin D level still low (15) -Continue Fosamax q. 7 days -Follow-up vitamin D level, change vitamin D supplementation as needed  History of COVID-19 Patient was significantly symptomatic approximately 1 month ago for a period of 5 to 6 days with cough, diarrhea,  abdominal pain, malaise, fatigue, headache, shortness of breath.  Patient believes she is much better but not back to 100%. -Continue to monitor for sequela     Clemetine Marker, MD PGY-2 [facility]

## 2019-11-18 NOTE — Patient Instructions (Addendum)
It was nice to see you again today,  I am glad that things are going well with your efforts to move out into your own place.  Sounds like that real estate agent who you met has been very helpful.  For your shoulder pain, I will refer you to sports medicine so that they can do an ultrasound and also possibly treat you with a steroid injection if necessary.  In the meantime I have refilled your meloxicam, for 2 weeks which should be enough to last until you see the sports medicine doctors.  I have refilled your inhalers, please take the Advair twice a day.  This will help your breathing more than anything.  I would like to see you again in 3 months.  Have a great day,  Clemetine Marker, MD

## 2019-11-19 DIAGNOSIS — Z8616 Personal history of COVID-19: Secondary | ICD-10-CM | POA: Insufficient documentation

## 2019-11-19 LAB — VITAMIN D 25 HYDROXY (VIT D DEFICIENCY, FRACTURES): Vit D, 25-Hydroxy: 28.1 ng/mL — ABNORMAL LOW (ref 30.0–100.0)

## 2019-11-19 NOTE — Assessment & Plan Note (Signed)
Patient was significantly symptomatic approximately 1 month ago for a period of 5 to 6 days with cough, diarrhea, abdominal pain, malaise, fatigue, headache, shortness of breath.  Patient believes she is much better but not back to 100%. -Continue to monitor for sequela

## 2019-11-19 NOTE — Assessment & Plan Note (Signed)
Does not have Advair.  Not taking will be.  Had to rely on albuterol nebulizer multiple times a day.  Covid infection.  Has not had to use albuterol inhaler in the last few weeks. -Refill Advair

## 2019-11-19 NOTE — Assessment & Plan Note (Signed)
Repeat DEXA scan showed T score -1.1.  Has been on bisphosphonate therapy for over a year.  Vitamin D level still low (15) -Continue Fosamax q. 7 days -Follow-up vitamin D level, change vitamin D supplementation as needed

## 2019-11-19 NOTE — Assessment & Plan Note (Signed)
No change in chronic right shoulder pain.  X-ray showed mild arthritic changes.  Hawkins test positive, empty can test positive for weakness and pain.  Patient probably has a partial-thickness tear of her supraspinatus.  Has not taken her Mobic in over a month.  Did not go to PT. -Referral to sports medicine -2 weeks of Mobic

## 2019-11-19 NOTE — Assessment & Plan Note (Signed)
Patient no longer taking bupropion.  Is taking sertraline.  Is on the path to greatly reducing her external stressors by selling her old house and moving in to a new house by herself. -Continue sertraline -Follow-up 3 months

## 2019-11-25 ENCOUNTER — Other Ambulatory Visit: Payer: Self-pay | Admitting: Family Medicine

## 2019-11-25 MED ORDER — ERGOCALCIFEROL 50 MCG (2000 UT) PO TABS: 1.0000 | ORAL_TABLET | Freq: Every day | ORAL | 3 refills | Status: DC

## 2019-11-25 NOTE — Progress Notes (Signed)
Discussed w/ patient stopping the 50k U weekly vit D and going to once a day at 2000 U

## 2020-03-02 IMAGING — MG MM DIGITAL SCREENING BILAT W/ TOMO W/ CAD
6 of 10 series · 6 of 30 positions shown · non-contrast
Comparison: Previous exam(s).

CLINICAL DATA: Screening.

EXAM:
DIGITAL SCREENING BILATERAL MAMMOGRAM WITH TOMO AND CAD

[L MLO synth-2D]
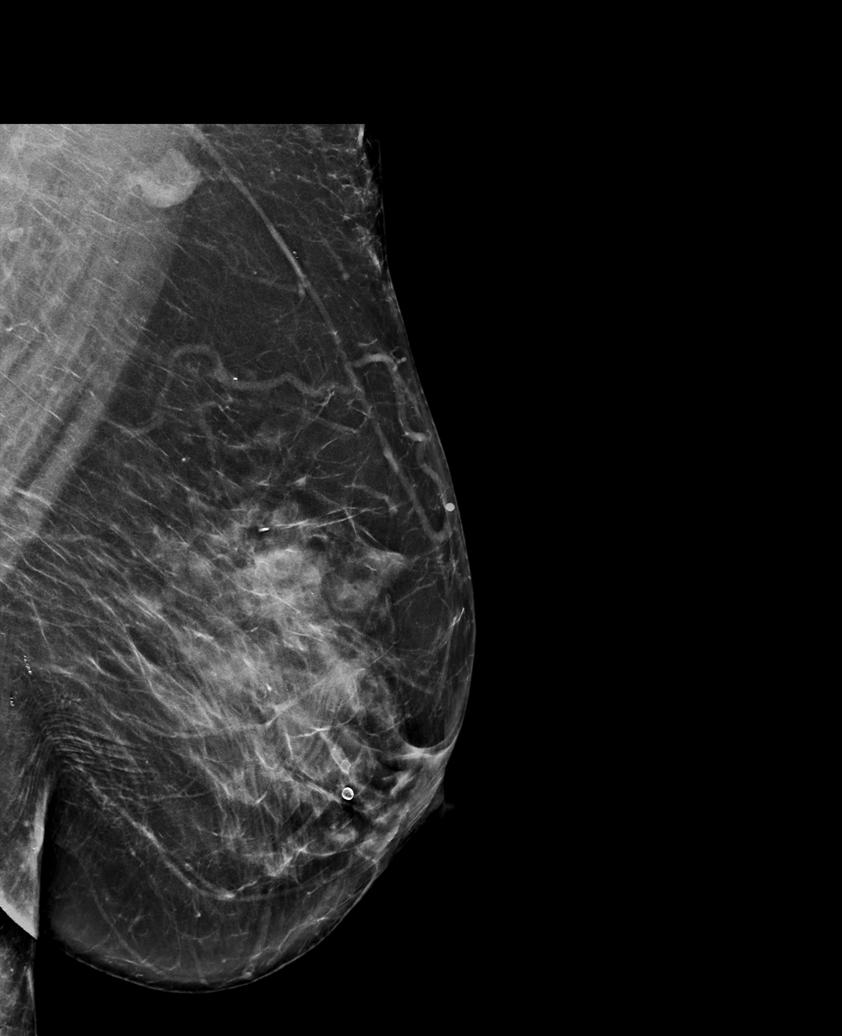

[L CC synth-2D]
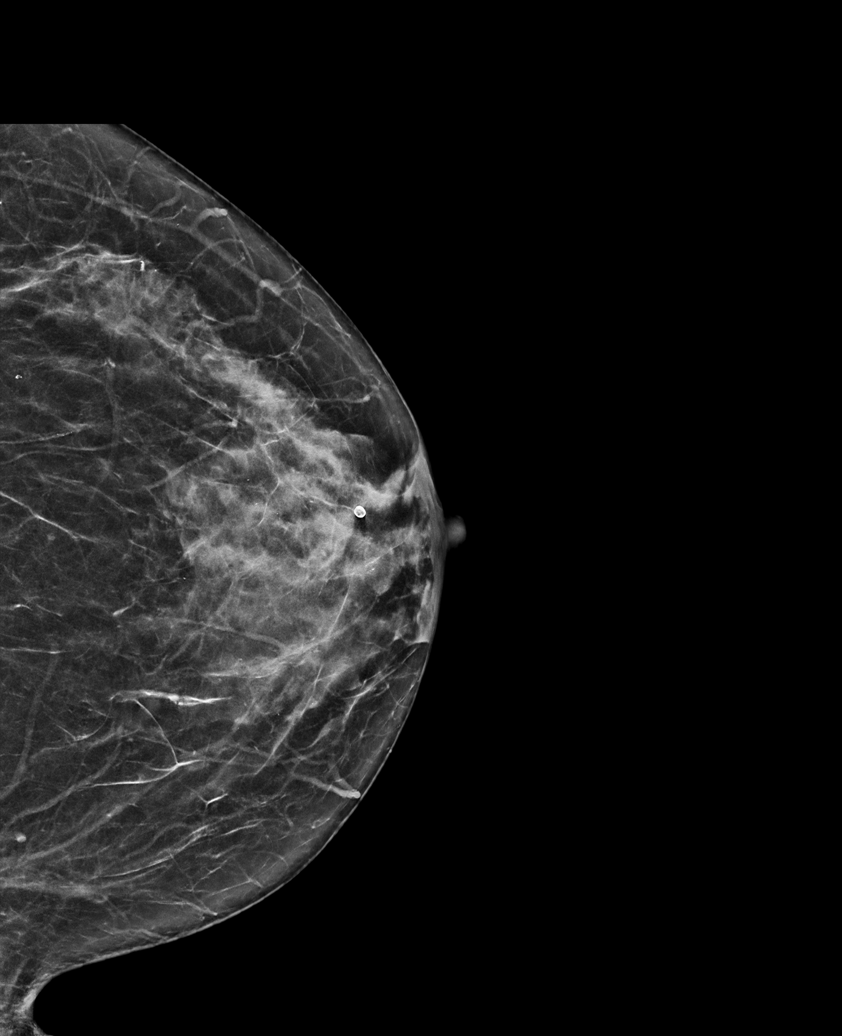

[R CC synth-2D (1 of 2)]
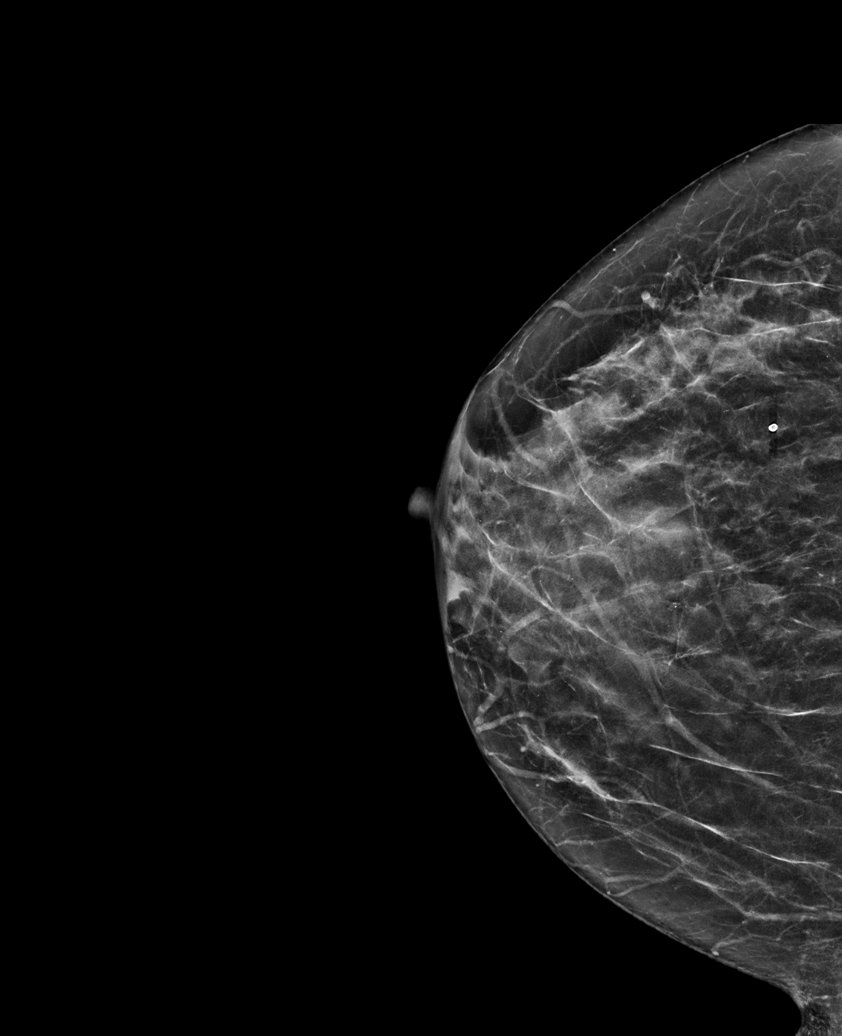

[R MLO synth-2D]
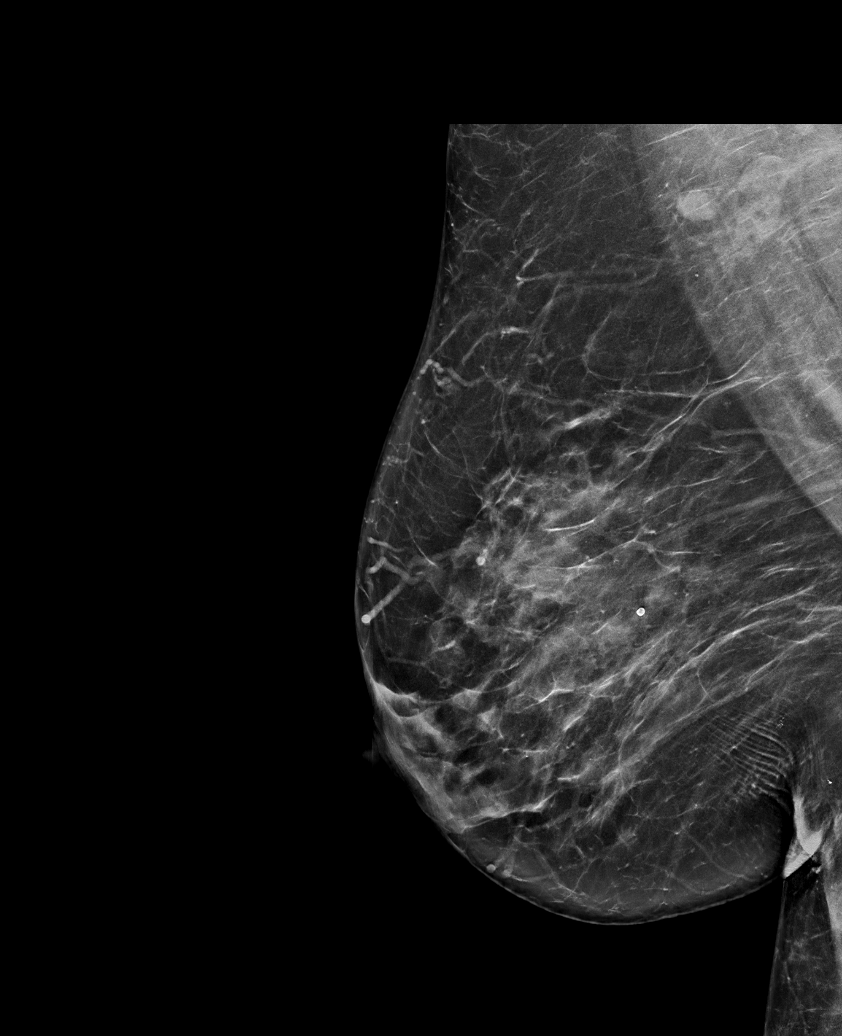

[R CC synth-2D (2 of 2)]
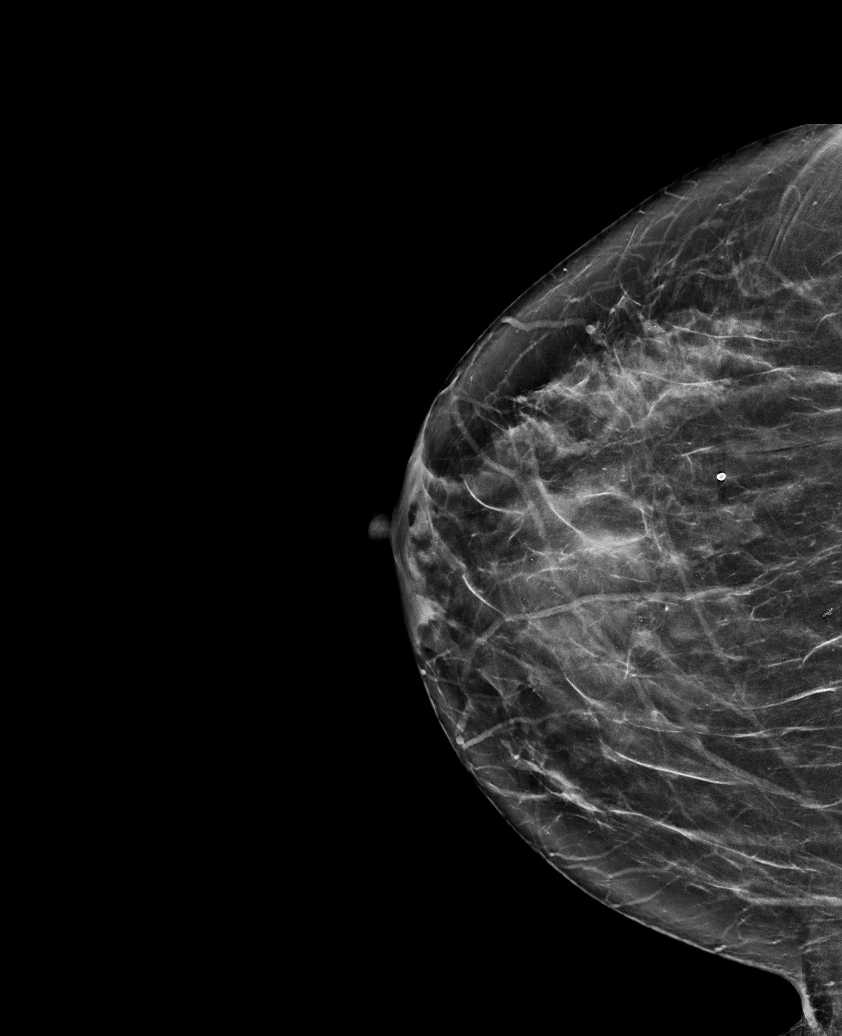

[L MLO tomo · tomo slice 41/81.0]
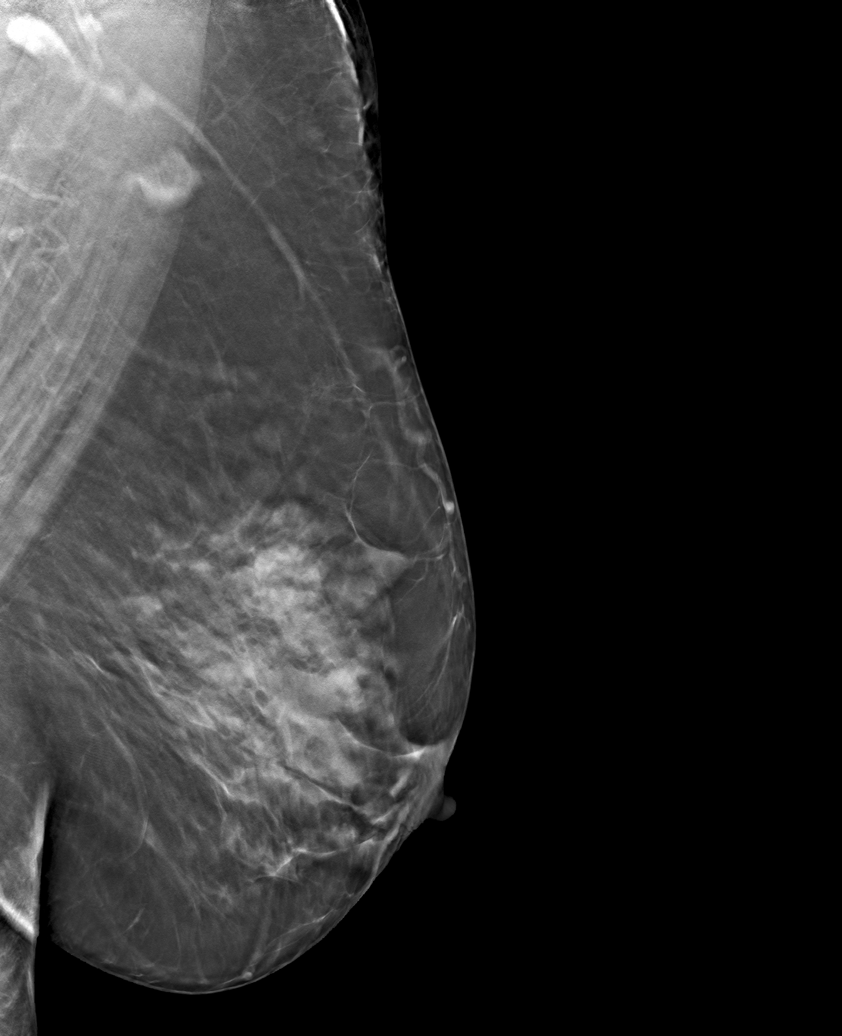

[6 of 30 positions shown; findings below may reference images not displayed]

ACR Breast Density Category c: The breast tissue is heterogeneously
dense, which may obscure small masses.
FINDINGS: There are no findings suspicious for malignancy. Images were
processed with CAD.
IMPRESSION: No mammographic evidence of malignancy. A result letter of this
screening mammogram will be mailed directly to the patient.

RECOMMENDATION:
Screening mammogram in one year. (Code:[id number])

BI-RADS CATEGORY  1: Negative.

## 2020-06-01 ENCOUNTER — Other Ambulatory Visit: Payer: Self-pay | Admitting: General Practice

## 2020-06-01 ENCOUNTER — Ambulatory Visit
Admission: RE | Admit: 2020-06-01 | Discharge: 2020-06-01 | Disposition: A | Discharge status: Home / Self Care | Payer: Medicare HMO | Source: Ambulatory Visit | Attending: General Practice | Admitting: General Practice

## 2020-06-01 DIAGNOSIS — M25562 Pain in left knee: Secondary | ICD-10-CM

## 2020-12-12 ENCOUNTER — Other Ambulatory Visit: Payer: Self-pay | Admitting: Student

## 2020-12-12 DIAGNOSIS — R109 Unspecified abdominal pain: Secondary | ICD-10-CM

## 2020-12-27 ENCOUNTER — Ambulatory Visit
Admission: RE | Admit: 2020-12-27 | Discharge: 2020-12-27 | Disposition: A | Discharge status: Home / Self Care | Payer: Medicare HMO | Source: Ambulatory Visit | Attending: Student | Admitting: Student

## 2020-12-27 DIAGNOSIS — R109 Unspecified abdominal pain: Secondary | ICD-10-CM

## 2021-01-01 ENCOUNTER — Other Ambulatory Visit: Payer: Self-pay | Admitting: Student

## 2021-01-01 DIAGNOSIS — N281 Cyst of kidney, acquired: Secondary | ICD-10-CM

## 2021-01-14 ENCOUNTER — Ambulatory Visit
Admission: RE | Admit: 2021-01-14 | Discharge: 2021-01-14 | Disposition: A | Discharge status: Home / Self Care | Payer: Medicare HMO | Source: Ambulatory Visit | Attending: Student | Admitting: Student

## 2021-01-14 DIAGNOSIS — N281 Cyst of kidney, acquired: Secondary | ICD-10-CM

## 2021-03-08 ENCOUNTER — Other Ambulatory Visit: Payer: Self-pay

## 2021-03-08 ENCOUNTER — Ambulatory Visit (INDEPENDENT_AMBULATORY_CARE_PROVIDER_SITE_OTHER): Payer: Medicare HMO

## 2021-03-08 ENCOUNTER — Encounter: Payer: Self-pay | Admitting: Internal Medicine

## 2021-03-08 ENCOUNTER — Ambulatory Visit (INDEPENDENT_AMBULATORY_CARE_PROVIDER_SITE_OTHER): Payer: Medicare HMO | Admitting: Internal Medicine

## 2021-03-08 DIAGNOSIS — J3800 Paralysis of vocal cords and larynx, unspecified: Secondary | ICD-10-CM | POA: Diagnosis not present

## 2021-03-08 DIAGNOSIS — J449 Chronic obstructive pulmonary disease, unspecified: Secondary | ICD-10-CM | POA: Diagnosis not present

## 2021-03-08 MED ORDER — PANTOPRAZOLE SODIUM 40 MG PO TBEC: 40.0000 mg | DELAYED_RELEASE_TABLET | Freq: Every day | ORAL | 2 refills | Status: DC

## 2021-03-08 MED ORDER — AMOXICILLIN-POT CLAVULANATE 875-125 MG PO TABS: 1.0000 | ORAL_TABLET | Freq: Two times a day (BID) | ORAL | 0 refills | Status: AC

## 2021-03-08 MED ORDER — METHYLPREDNISOLONE ACETATE 80 MG/ML IJ SUSP
120.0000 mg | Freq: Once | INTRAMUSCULAR | Status: AC
  Administered 2021-03-08: 120 mg via INTRAMUSCULAR

## 2021-03-08 MED ORDER — FAMOTIDINE 20 MG PO TABS: ORAL_TABLET | ORAL | 11 refills | Status: DC

## 2021-03-08 NOTE — Patient Instructions (Addendum)
Augmentin 875 mg take one pill twice daily  X 10 days - take at breakfast and supper with large glass of water.  It would help reduce the usual side effects (diarrhea and yeast infections) if you ate cultured yogurt at lunch.   Depomedrol 120 mg IM   For cough / congestion > mucinex dm 1200 mg every 12 hours as needed   Change Symbicort to 80 Take 2 puffs first thing in am and then another 2 puffs about 12 hours later.   Work on inhaler technique:  relax and gently blow all the way out then take a nice smooth deep breath back in, triggering the inhaler at same time you start breathing in.  Hold for up to 5 seconds if you can. Blow out thru nose. Rinse and gargle with water when done     Only use your albuterol as a rescue medication to be used if you can't catch your breath by resting or doing a relaxed purse lip breathing pattern.  - The less you use it, the better it will work when you need it. - Ok to use up to 2 puffs  every 4 hours if you must but call for immediate appointment if use goes up over your usual need - Don't leave home without it !!  (think of it like the spare tire for your car)   Pantoprazole (protonix) 40 mg   Take  30-60 min before first meal of the day and Pepcid (famotidine)  20 mg after supper until return to office - this is the best way to tell whether stomach acid is contributing to your problem.    GERD (REFLUX)  is an extremely common cause of respiratory symptoms just like yours , many times with no obvious heartburn at all.    It can be treated with medication, but also with lifestyle changes including elevation of the head of your bed (ideally with 6 -8inch blocks under the headboard of your bed),  Smoking cessation, avoidance of late meals, excessive alcohol, and avoid fatty foods, chocolate, peppermint, colas, red wine, and acidic juices such as orange juice.  NO MINT OR MENTHOL PRODUCTS SO NO COUGH DROPS  USE SUGARLESS CANDY INSTEAD (Jolley ranchers or  Stover's or Life Savers) or even ice chips will also do - the key is to swallow to prevent all throat clearing. NO OIL BASED VITAMINS - use powdered substitutes.  Avoid fish oil when coughing.      Please remember to go to the lab and x-ray department  for your tests - we will call you with the results when they are available.       Please schedule a follow up office visit in 4 weeks, sooner if needed

## 2021-03-08 NOTE — Assessment & Plan Note (Signed)
RIGHT vocal cord paralysis with modest midfold atrophy Seen by Dr,. Vale Haven wright at [facility]. Voice lab  eval scheduled,consider repeat injection augmentation after lab. Increased Flonase to QD. Consider twice daily PPI. F/U pRN  >>>  F/u Dr Joya Gaskins  - consider full pfts to sort our upper vs lower airway obst at f/u if not improving on modified rx          Each maintenance medication was reviewed in detail including emphasizing most importantly the difference between maintenance and prns and under what circumstances the prns are to be triggered using an action plan format where appropriate.  Total time for H and P, chart review, counseling, reviewing hfa device(s) and generating customized AVS unique to this office visit / same day charting = 48 min

## 2021-03-08 NOTE — Assessment & Plan Note (Signed)
Onset ? Age 75  - 03/08/2021  After extensive coaching inhaler device,  effectiveness =    75% from a baseline of 25% so try change symb 160 to 80 2bid  - Allergy profile 03/08/2021 >  Eos 0. /  IgE    DDX of  difficult airways management almost all start with A and  include Adherence, Ace Inhibitors, Acid Reflux, Active Sinus Disease, Alpha 1 Antitripsin deficiency, Anxiety masquerading as Airways dz,  ABPA,  Allergy(esp in young), Aspiration (esp in elderly), Adverse effects of meds,  Active smoking or vaping, A bunch of PE's (a small clot burden can't cause this syndrome unless there is already severe underlying pulm or vascular dz with poor reserve) plus two Bs  = Bronchiectasis and Beta blocker use..and one C= CHF   Adherence is always the initial "prime suspect" and is a multilayered concern that requires a "trust but verify" approach in every patient - starting with knowing how to use medications, especially inhalers, correctly, keeping up with refills and understanding the fundamental difference between maintenance and prns vs those medications only taken for a very short course and then stopped and not refilled.  - see hfa teaching - return with all meds in hand using a trust but verify approach to confirm accurate Medication  Reconciliation The principal here is that until we are certain that the  patients are doing what we've asked, it makes no sense to ask them to do more.   ? Allergy > check profile/ depo 120 mg IM and continue ICS but try the 80 2bid so as not to aggravate the UACS  ? Active sinus dz >  augmentin 875 bid x 10 days  ? Acid (or non-acid) GERD > always difficult to exclude as up to 75% of pts in some series report no assoc GI/ Heartburn symptoms> rec max (24h)  acid suppression and diet restrictions/d/c  fosamax and  reviewed and instructions given in writing.

## 2021-03-08 NOTE — Progress Notes (Signed)
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 Consult    Pt states she has had a lot of problems with cough and SOB which began to get worse about two months ago. States she will occ cough up gray to green phlegm and also will have some wheezing.  Dyspnea:  MMRC3 = can't walk 100 yards even at a slow pace at a flat grade s stopping due to sob   Cough: green mucus assoc with sinus congestion esp in am  Sleep: flat/ 2 pillows SABA use: don't seem to help  No obvious day to day or daytime variability or assoc   mucus plugs or hemoptysis or cp or chest tightness, subjective wheeze or overt sinus or hb symptoms.    . Also denies any obvious fluctuation of symptoms with weather or environmental changes or other aggravating or alleviating factors except as outlined above   No unusual exposure hx or h/o childhood pna/ asthma or knowledge of premature birth.  Current Allergies, Complete Past Medical History, Past Surgical History, Family History, and Social History were reviewed in [employer] record.  ROS  The following are not active complaints unless bolded Hoarseness, sore throat, dysphagia, dental problems, itching, sneezing,  nasal congestion or discharge of excess mucus or purulent secretions, ear ache,   fever, chills, sweats, unintended wt loss or wt gain, classically pleuritic or exertional cp,  orthopnea pnd or arm/hand swelling  or leg swelling, presyncope, palpitations, abdominal pain, anorexia, nausea, vomiting, diarrhea  or change in bowel habits or change in bladder habits, change in stools or change in  urine, dysuria, hematuria,  rash, arthralgias, visual complaints, headache, numbness, weakness or ataxia or problems with walking or coordination,  change in mood or  memory.           Past Medical History:  Diagnosis Date  . Acute cystitis with hematuria 07/22/2018  . Anxiety and depression 09/06/2018  . Blood transfusion    after hysterectomy  . Bronchitis, chronic ([place]) 2005  . Carpal tunnel syndrome 12/24/2006   Qualifier: Diagnosis of  By: Shelbie Proctor    . CHOLELITHIASIS 04/25/2009   Qualifier: Diagnosis of  By: Genene Churn MD, Janett Billow    . COPD (chronic obstructive pulmonary disease) ([place])   . Depression   . GASTROESOPHAGEAL REFLUX, NO ESOPHAGITIS 12/24/2006   Qualifier: Diagnosis of  By: Shelbie Proctor    . GLOMUS TUMOR 2001   Qualifier: Diagnosis of  By: Genene Churn MD, Janett Billow    . Hyperlipidemia   . Hypertension   . MENOPAUSAL SYNDROME 12/24/2006   Qualifier: Diagnosis of  By: Shelbie Proctor    . NECK MASS 12/29/2006   Qualifier: Diagnosis of  By: Laurance Flatten CMA, [place]    . Sciatica 02/10/2017  . Screening for hypothyroidism 07/21/2017  . Vocal cord paralysis 05/26/2011   RIGHT vocal cord parlaysis Seen by Dr,. Pricilla Riffle at [facility].( May 03, 2011)  Placed on scopalamine patch and w f/u [facility]     Outpatient Medications  Prior to Visit - - NOTE:   Unable to verify as accurately reflecting what pt takes     Medication Sig Dispense Refill  . albuterol (VENTOLIN HFA) 108 (90 Base) MCG/ACT inhaler Inhale 2 puffs into the lungs every 6 (six) hours as needed for wheezing or shortness of breath.    Marland Kitchen aspirin EC 81 MG tablet Take by mouth.    Marland Kitchen buPROPion (WELLBUTRIN SR) 150 MG 12 hr tablet Take 1 tablet (150 mg total) by mouth 2 (two) times daily. 60 tablet 1  . Calcium Carbonate-Vitamin D3 (CALCIUM 600/VITAMIN D) 600-400 MG-UNIT TABS Take 2 tablets by mouth daily with meal. 180 tablet 3  . cetirizine (ZYRTEC) 10 MG tablet Take 1 tablet (10 mg total) by  mouth daily. 30 tablet 11  . Ergocalciferol 50 MCG (2000 UT) TABS Take 1 tablet by mouth daily. 90 tablet 3  . famotidine (PEPCID) 10 MG tablet Take 1 tablet (10 mg total) by mouth 2 (two) times daily. 180 tablet 1  . gabapentin (NEURONTIN) 300 MG capsule Take two capsules at night. 90 capsule 3  . hydrochlorothiazide (HYDRODIURIL) 25 MG tablet Take 1 tablet (25 mg total) by mouth daily. 90 tablet 3  . losartan (COZAAR) 25 MG tablet Take 1 tablet (25 mg total) by mouth daily. 30 tablet 3  . meloxicam (MOBIC) 15 MG tablet Take 1 tablet (15 mg total) by mouth daily. 15 tablet 0  . mometasone (NASONEX) 50 MCG/ACT nasal spray Place 1 spray into the nose daily. 17 g 3  . Multiple Vitamin (MULTI-VITAMINS) TABS Take by mouth.    . sertraline (ZOLOFT) 100 MG tablet Take 1.5 tablets (150 mg total) by mouth daily. 90 tablet 2  . traZODone (DESYREL) 50 MG tablet Take 1 tablet (50 mg total) by mouth at bedtime. 90 tablet 3  . albuterol (PROVENTIL HFA;VENTOLIN HFA) 108 (90 Base) MCG/ACT inhaler Inhale 2 puffs into the lungs every 6 (six) hours as needed for wheezing. 1 Inhaler 0  . alendronate (FOSAMAX) 70 MG tablet One tablet by mouth every 7 days with a full glass of water on empty stomach starting 12/01/2017.  Stay upright for 60 mins after taking. (Patient not taking: Reported on 03/08/2021) 4 tablet 11  . budesonide-formoterol (SYMBICORT) 160-4.5 MCG/ACT inhaler Inhale 2 puffs into the lungs 2 (two) times daily.    . pravastatin (PRAVACHOL) 40 MG tablet Take 1 tablet (40 mg total) by mouth daily. (Patient not taking: Reported on 03/08/2021) 90 tablet 3  . Fluticasone-Salmeterol (ADVAIR DISKUS) 250-50 MCG/DOSE AEPB  stopped around April 2022     . gabapentin (NEURONTIN) 100 MG capsule Take 1 capsule (100 mg total) by mouth 2 (two) times daily with breakfast and lunch. 60 capsule 3  . guaiFENesin-codeine (CHERATUSSIN AC) 100-10 MG/5ML syrup Take 5 mLs by mouth 3 (three) times daily as needed for cough. (Patient  not taking: Reported on 07/14/2018) 120 mL 0   No facility-administered medications prior to visit.     Objective:     BP 128/70 (BP Location: Left Arm, Patient Position: Sitting, Cuff Size: Large)   Pulse 72   Temp 98 F (36.7 C) (Temporal)   Ht 5\' 5"  (1.651 m)   Wt 201 lb 12.8 oz (91.5 kg)   SpO2 97% Comment: RA  BMI 33.58 kg/m   SpO2: 97 % (RA)   Hoarse pleasant amb bf nad/ classic pseudowheeze    HEENT : pt wearing mask not removed for exam due to covid -19 concerns.  NECK :  without JVD/Nodes/TM/ nl carotid upstrokes bilaterally   LUNGS: no acc muscle use,  Nl contour chest which is clear to A and P bilaterally without cough on insp or exp maneuvers   CV:  RRR  no s3 or murmur or increase in P2, and no edema   ABD:  soft and nontender with nl inspiratory excursion in the supine position. No bruits or organomegaly appreciated, bowel sounds nl  MS:  Nl gait/ ext warm without deformities, calf tenderness, cyanosis or clubbing No obvious joint restrictions   SKIN: warm and dry without lesions    NEURO:  alert, approp, nl sensorium with  no motor or cerebellar deficits apparent.    CXR PA and Lateral:   03/08/2021 :    I personally reviewed images / impression as follows:   No acute changes   Labs ordered 03/08/2021  :  allergy profile   alpha one AT phenotype           Assessment   Asthmatic bronchitis , chronic (HCC)  vs UACS Onset ? Age 59  - 03/08/2021  After extensive coaching inhaler device,  effectiveness =    75% from a baseline of 25% so try change symb 160 to 80 2bid  - Allergy profile 03/08/2021 >  Eos 0. /  IgE    DDX of  difficult airways management almost all start with A and  include Adherence, Ace Inhibitors, Acid Reflux, Active Sinus Disease, Alpha 1 Antitripsin deficiency, Anxiety masquerading as Airways dz,  ABPA,  Allergy(esp in young), Aspiration (esp in elderly), Adverse effects of meds,  Active smoking or vaping, A bunch of PE's (a  small clot burden can't cause this syndrome unless there is already severe underlying pulm or vascular dz with poor reserve) plus two Bs  = Bronchiectasis and Beta blocker use..and one C= CHF   Adherence is always the initial "prime suspect" and is a multilayered concern that requires a "trust but verify" approach in every patient - starting with knowing how to use medications, especially inhalers, correctly, keeping up with refills and understanding the fundamental difference between maintenance and prns vs those medications only taken for a very short course and then stopped and not refilled.  - see hfa teaching - return with all meds in hand using a trust but verify approach to confirm accurate Medication  Reconciliation The principal here is that until we are certain that the  patients are doing what we've asked, it makes no sense to ask them to do more.   ? Allergy > check profile/ depo 120 mg IM and continue ICS but try the 80 2bid so as not to aggravate the UACS  ? Active sinus dz >  augmentin 875 bid x 10 days  ? Acid (or non-acid) GERD > always difficult to exclude as up to 75% of pts in some series report no assoc GI/ Heartburn symptoms> rec max (24h)  acid suppression and diet restrictions/d/c  fosamax and  reviewed and instructions given in writing.       Vocal cord paralysis RIGHT vocal cord paralysis with modest midfold atrophy Seen by Dr,. Vale Haven wright at [facility]. Voice lab  eval scheduled,consider repeat injection augmentation after lab. Increased Flonase to QD. Consider twice daily PPI. F/U pRN  >>>  F/u Dr Joya Gaskins  - consider full pfts to sort our upper vs lower airway obst at f/u if not improving on modified rx  Each maintenance medication was reviewed in detail including emphasizing most importantly the difference between maintenance and prns and under what circumstances the prns are to be triggered using an action plan format where appropriate.  Total time  for H and P, chart review, counseling, reviewing hfa device(s) and generating customized AVS unique to this office visit / same day charting = 48 min          Christinia Gully, MD 03/08/2021

## 2021-03-11 ENCOUNTER — Encounter: Payer: Self-pay | Admitting: *Deleted

## 2021-03-11 LAB — CBC WITH DIFFERENTIAL/PLATELET
Absolute Monocytes: 419 cells/uL (ref 200–950)
Basophils Absolute: 32 cells/uL (ref 0–200)
Basophils Relative: 0.7 %
Eosinophils Absolute: 108 cells/uL (ref 15–500)
Eosinophils Relative: 2.4 %
HCT: 38.2 % (ref 35.0–45.0)
Hemoglobin: 12.9 g/dL (ref 11.7–15.5)
Lymphs Abs: 1935 cells/uL (ref 850–3900)
MCH: 30.8 pg (ref 27.0–33.0)
MCHC: 33.8 g/dL (ref 32.0–36.0)
MCV: 91.2 fL (ref 80.0–100.0)
MPV: 10.2 fL (ref 7.5–12.5)
Monocytes Relative: 9.3 %
Neutro Abs: 2007 cells/uL (ref 1500–7800)
Neutrophils Relative %: 44.6 %
Platelets: 200 10*3/uL (ref 140–400)
RBC: 4.19 10*6/uL (ref 3.80–5.10)
RDW: 13 % (ref 11.0–15.0)
Total Lymphocyte: 43 %
WBC: 4.5 10*3/uL (ref 3.8–10.8)

## 2021-03-11 LAB — IGE: IgE (Immunoglobulin E), Serum: 26 kU/L (ref <=114)

## 2021-04-09 ENCOUNTER — Other Ambulatory Visit: Payer: Self-pay

## 2021-04-09 ENCOUNTER — Ambulatory Visit (INDEPENDENT_AMBULATORY_CARE_PROVIDER_SITE_OTHER): Payer: Medicare HMO | Admitting: Primary Care

## 2021-04-09 ENCOUNTER — Encounter: Payer: Self-pay | Admitting: Primary Care

## 2021-04-09 VITALS — BP 138/70 | HR 89 | Temp 98.0°F | Ht 66.0 in | Wt 199.6 lb

## 2021-04-09 DIAGNOSIS — J301 Allergic rhinitis due to pollen: Secondary | ICD-10-CM

## 2021-04-09 DIAGNOSIS — J449 Chronic obstructive pulmonary disease, unspecified: Secondary | ICD-10-CM

## 2021-04-09 DIAGNOSIS — J3800 Paralysis of vocal cords and larynx, unspecified: Secondary | ICD-10-CM

## 2021-04-09 NOTE — Progress Notes (Signed)
@Patient  ID: Andrea Buck, female    DOB: [birth year], 75 y.o.   MRN: [id number]  Chief Complaint  Patient presents with   Follow-up    Sob, nasal congestion, wheezing.     Referring provider: No ref. 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  Past medical history significant for asthmatic bronchitis versus upper airway cough syndrome, seasonal allergic rhinitis, vocal cord paralysis, GERD, history of COVID-19, obesity.  Patient of Dr. Melvyn Novas to consult on 03/08/2021 for asthmatic bronchitis.   04/09/2021 - Interim hx Patient presents today for 1 month follow-up. Cough and SOB worse over the last 2 months. During her last visit she was given Augmentin BID x 10 days and depo 120mg  IM for suspected sinus disease contributing to cough. She is feeling better but still having some issues with nasal congestion. Mucus is thick, she has some voice hoarseness at night. She has a hard time getting mucus up. She is taking Mucinex twice daily and is using Flonase. Hx right vocal cord paralysis with modest midfold atrophy, seen by Dr. Joya Gaskins at [facility]. Reflux has improved some, she is consistent with taking Protonix BID and Pepcid. She is on Symbicort 70mcg two puffs twice a day. She has not had to use her albuterol rescue inhaler   Consider full PFTs to sort out upper vs lower airway obstruction if not improving on RX.   No Known Allergies  Immunization History  Administered Date(s) Administered   Influenza, High Dose Seasonal PF 12/25/2020   Influenza,inj,Quad PF,6+ Mos 07/21/2013, 10/23/2015, 07/01/2017, 07/22/2018, 07/12/2019   Influenza-Unspecified 07/26/2016   Moderna Sars-Covid-2 Vaccination 11/29/2019, 12/27/2019, 08/20/2020   Pneumococcal Conjugate-13 06/15/2015    Pneumococcal Polysaccharide-23 02/21/2011, 12/05/2011   Td 05/28/1999    Past Medical History:  Diagnosis Date   Acute cystitis with hematuria 07/22/2018   Anxiety and depression 09/06/2018   Blood transfusion    after hysterectomy   Bronchitis, chronic ([place]) 2005   Carpal tunnel syndrome 12/24/2006   Qualifier: Diagnosis of  By: Shelbie Proctor     CHOLELITHIASIS 04/25/2009   Qualifier: Diagnosis of  By: Genene Churn MD, Jessica     COPD (chronic obstructive pulmonary disease) ([place])    Depression    GASTROESOPHAGEAL REFLUX, NO ESOPHAGITIS 12/24/2006   Qualifier: Diagnosis of  By: Oneita Jolly TUMOR 2001   Qualifier: Diagnosis of  By: Genene Churn MD, Jessica     Hyperlipidemia    Hypertension    MENOPAUSAL SYNDROME 12/24/2006   Qualifier: Diagnosis of  By: Shelbie Proctor     NECK MASS 12/29/2006   Qualifier: Diagnosis of  By: Laurance Flatten CMA, [place]     Sciatica 02/10/2017   Screening for hypothyroidism 07/21/2017   Vocal cord paralysis 05/26/2011   RIGHT vocal cord parlaysis Seen by Dr,. Pricilla Riffle at [facility].( May 03, 2011)  Placed on scopalamine patch and w f/u [facility]     Tobacco History: Social History   Tobacco Use  Smoking Status Never  Smokeless Tobacco Never  Tobacco Comments   no plans to start   Counseling given: Not Answered Tobacco comments: no plans to start   Outpatient Medications Prior to Visit  Medication  Sig Dispense Refill   albuterol (VENTOLIN HFA) 108 (90 Base) MCG/ACT inhaler Inhale 2 puffs into the lungs every 6 (six) hours as needed for wheezing or shortness of breath.     aspirin EC 81 MG tablet Take by mouth.     buPROPion (WELLBUTRIN SR) 150 MG 12 hr tablet Take 1 tablet (150 mg total) by mouth 2 (two) times daily. 60 tablet 1   Calcium Carbonate-Vitamin D3 (CALCIUM 600/VITAMIN D) 600-400 MG-UNIT TABS Take 2 tablets by mouth daily with meal. 180 tablet 3   cetirizine (ZYRTEC) 10 MG tablet Take 1 tablet (10 mg  total) by mouth daily. 30 tablet 11   Ergocalciferol 50 MCG (2000 UT) TABS Take 1 tablet by mouth daily. 90 tablet 3   famotidine (PEPCID) 20 MG tablet One after supper 30 tablet 11   gabapentin (NEURONTIN) 300 MG capsule Take two capsules at night. 90 capsule 3   hydrochlorothiazide (HYDRODIURIL) 25 MG tablet Take 1 tablet (25 mg total) by mouth daily. 90 tablet 3   losartan (COZAAR) 25 MG tablet Take 1 tablet (25 mg total) by mouth daily. 30 tablet 3   meloxicam (MOBIC) 15 MG tablet Take 1 tablet (15 mg total) by mouth daily. 15 tablet 0   mometasone (NASONEX) 50 MCG/ACT nasal spray Place 1 spray into the nose daily. 17 g 3   Multiple Vitamin (MULTI-VITAMINS) TABS Take by mouth.     pantoprazole (PROTONIX) 40 MG tablet Take 1 tablet (40 mg total) by mouth daily. Take 30-60 min before first meal of the day 30 tablet 2   pravastatin (PRAVACHOL) 40 MG tablet Take 1 tablet (40 mg total) by mouth daily. 90 tablet 3   sertraline (ZOLOFT) 100 MG tablet Take 1.5 tablets (150 mg total) by mouth daily. 90 tablet 2   traZODone (DESYREL) 50 MG tablet Take 1 tablet (50 mg total) by mouth at bedtime. 90 tablet 3   albuterol (PROVENTIL HFA;VENTOLIN HFA) 108 (90 Base) MCG/ACT inhaler Inhale 2 puffs into the lungs every 6 (six) hours as needed for wheezing. 1 Inhaler 0   No facility-administered medications prior to visit.      Review of Systems  Review of Systems  Constitutional: Negative.   HENT:  Positive for congestion.   Respiratory:  Positive for cough.   Cardiovascular: Negative.     Physical Exam  BP 138/70 (BP Location: Left Arm, Patient Position: Sitting, Cuff Size: Normal)   Pulse 89   Temp 98 F (36.7 C) (Temporal)   Ht 5\' 6"  (1.676 m)   Wt 199 lb 9.6 oz (90.5 kg)   SpO2 99%   BMI 32.22 kg/m  Physical Exam Constitutional:      Appearance: Normal appearance.  HENT:     Head: Normocephalic and atraumatic.     Mouth/Throat:     Comments: Deferred d/t masking Cardiovascular:      Rate and Rhythm: Normal rate.  Pulmonary:     Effort: Pulmonary effort is normal.     Breath sounds: Normal breath sounds.  Skin:    General: Skin is warm and dry.  Neurological:     General: No focal deficit present.     Mental Status: She is alert and oriented to person, place, and time. Mental status is at baseline.  Psychiatric:        Mood and Affect: Mood normal.        Behavior: Behavior normal.        Thought Content: Thought content normal.  Judgment: Judgment normal.     Lab Results:  CBC    Component Value Date/Time   WBC 4.5 03/08/2021 1625   RBC 4.19 03/08/2021 1625   HGB 12.9 03/08/2021 1625   HGB 12.5 07/21/2017 0931   HCT 38.2 03/08/2021 1625   HCT 39.6 07/21/2017 0931   PLT 200 03/08/2021 1625   PLT 197 07/21/2017 0931   MCV 91.2 03/08/2021 1625   MCV 94 07/21/2017 0931   MCH 30.8 03/08/2021 1625   MCHC 33.8 03/08/2021 1625   RDW 13.0 03/08/2021 1625   RDW 13.9 07/21/2017 0931   LYMPHSABS 1,935 03/08/2021 1625   LYMPHSABS 1.5 07/21/2017 0931   MONOABS 0.3 07/21/2013 1129   EOSABS 108 03/08/2021 1625   EOSABS 0.2 07/21/2017 0931   BASOSABS 32 03/08/2021 1625   BASOSABS 0.0 07/21/2017 0931    BMET    Component Value Date/Time   NA 141 06/08/2019 1132   K 4.2 06/08/2019 1132   CL 104 06/08/2019 1132   CO2 22 06/08/2019 1132   GLUCOSE 105 (H) 06/08/2019 1132   GLUCOSE 102 (H) 10/29/2016 1601   BUN 17 06/08/2019 1132   CREATININE 0.79 06/08/2019 1132   CREATININE 0.68 10/29/2016 1601   CALCIUM 9.1 06/08/2019 1132   GFRNONAA 74 06/08/2019 1132   GFRAA 86 06/08/2019 1132    BNP No results found for: BNP  ProBNP No results found for: PROBNP  Imaging: No results found.   Assessment & Plan:   Seasonal allergic rhinitis due to pollen - Tx for sinus disease/cough in May 2022 with Augmentin x 10 days. Continues to have nasal congestion, thick mucus with associated voice hoarseness - Continue Flonase nasal spray daily as  directed - Advised patient start saline irrigation with Netti pot 1-2 times a day for nasal congestion  Vocal cord paralysis -Hx right vocal cord paralysis with modest midfold atrophy, seen by Dr. Joya Gaskins at [facility]. Reflux has improved some, she is consistent with taking Protonix BID and Pepcid.  Asthmatic bronchitis , chronic (HCC)  vs UACS - Chronic cough with mucus production. No active dyspnea symptoms. No recet SABA use  - Continue Symbicort 21mcg two puffs twice a day and Mucinex 600-1200mg  BID - Needs PFTs to sort out upper vs lower airway obstruction     Martyn Ehrich, NP 05/13/2021

## 2021-04-09 NOTE — Patient Instructions (Addendum)
Orders: PFTs   Recommendations: Continue Mucinex and Flonase nasal spray Try using netti pot 1-2 times a day for nasal congestion

## 2021-05-13 NOTE — Assessment & Plan Note (Addendum)
-   Tx for sinus disease/cough in May 2022 with Augmentin x 10 days. Continues to have nasal congestion, thick mucus with associated voice hoarseness - Continue Flonase nasal spray daily as directed - Advised patient start saline irrigation with Netti pot 1-2 times a day for nasal congestion

## 2021-05-13 NOTE — Assessment & Plan Note (Signed)
-  Hx right vocal cord paralysis with modest midfold atrophy, seen by Dr. Joya Gaskins at [facility]. Reflux has improved some, she is consistent with taking Protonix BID and Pepcid.

## 2021-05-13 NOTE — Assessment & Plan Note (Addendum)
-   Chronic cough with mucus production. No active dyspnea symptoms. No recet SABA use  - Continue Symbicort 11mcg two puffs twice a day and Mucinex 600-1200mg  BID - Needs PFTs to sort out upper vs lower airway obstruction

## 2021-05-15 ENCOUNTER — Ambulatory Visit (INDEPENDENT_AMBULATORY_CARE_PROVIDER_SITE_OTHER): Payer: Medicare HMO | Admitting: Family Medicine

## 2021-05-15 ENCOUNTER — Other Ambulatory Visit: Payer: Self-pay

## 2021-05-15 ENCOUNTER — Encounter: Payer: Self-pay | Admitting: Family Medicine

## 2021-05-15 VITALS — BP 150/80 | HR 65 | Ht 66.0 in | Wt 198.4 lb

## 2021-05-15 DIAGNOSIS — M810 Age-related osteoporosis without current pathological fracture: Secondary | ICD-10-CM | POA: Diagnosis not present

## 2021-05-15 DIAGNOSIS — E559 Vitamin D deficiency, unspecified: Secondary | ICD-10-CM | POA: Diagnosis not present

## 2021-05-15 DIAGNOSIS — E785 Hyperlipidemia, unspecified: Secondary | ICD-10-CM

## 2021-05-15 MED ORDER — PRAVASTATIN SODIUM 40 MG PO TABS: 40.0000 mg | ORAL_TABLET | Freq: Every day | ORAL | 3 refills | Status: DC

## 2021-05-15 NOTE — Patient Instructions (Signed)
It was great seeing you today!  Please check-out at the front desk before leaving the clinic. I'd like to see you back for your physical in  but if you need to be seen earlier than that for any new issues we're happy to fit you in, just give [place] a call!  Visit Remembers: - Stop by the pharmacy to pick up your prescriptions  - Continue to work on your healthy eating habits and incorporating exercise into your daily life.  - Your goal is to have an BP < 135/85 - Medicine Changes: none    Regarding lab work today:  Due to recent changes in healthcare laws, you may see the results of your imaging and laboratory studies on MyChart before your provider has had a chance to review them.  I understand that in some cases there may be results that are confusing or concerning to you. Not all laboratory results come back in the same time frame and you may be waiting for multiple results in order to interpret others.  Please give [place] 72 hours in order for your provider to thoroughly review all the results before contacting the office for clarification of your results. If everything is normal, you will get a letter in the mail or a message in My Chart. Please give [place] a call if you do not hear from [place] after 2 weeks.  Please bring all of your medications with you to each visit.    If you haven't already, sign up for My Chart to have easy access to your labs results, and communication with your primary care physician.  Feel free to call with any questions or concerns at any time, at [phone].   Take care,  Dr. Rushie [facility]

## 2021-05-15 NOTE — Progress Notes (Signed)
   SUBJECTIVE:   CHIEF COMPLAINT / HPI:      Andrea Buck is a 75 y.o. female here for to establish care.  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 Has several grandchildren and continues to look for after her 5 children.  Previous [facility] pt but switched to Dr. Posey Pronto at [facility]. Has no concerns today.   New Patient : PMHx: reviewed   Meds: reviewed   ALL: reviewed   SurgHx: reviewed  GYNHx: Had abdominal hysterectomy. Had 5 children.    Social Hx:  Her son lives with her. Endorses multiple life stressors relating to her children.  Walks for exercise on occasion. Denies tobacco use, alcohol use and illicit drug use.      PERTINENT  PMH / PSH: reviewed and updated as appropriate   OBJECTIVE:   BP (!) 150/80   Pulse 65   Ht 5\' 6"  (1.676 m)   Wt 198 lb 6 oz (90 kg)   SpO2 96%   BMI 32.02 kg/m    GEN: pleasant, well appearing older female, appears younger than states age  HENT: hoarse s/p vocal cord paralysis  CV: regular rate and rhythm RESP: no increased work of breathing, clear to ascultation bilaterally  ABD: Bowel sounds present. Soft, Nontender, Nondistended.  MSK: no LE edema, normal gait SKIN: warm, dry NEURO: alert, oriented, moves all extremities appropriately PSYCH: Normal affect, appropriate speech and behavior    ASSESSMENT/PLAN:   Age-related osteoporosis without current pathological fracture Previous T score -2.5 improved with Fosamax.  BMP to check Ca and check Vit D level today.  Has not been taking calcium or Vit D regularly.   Dyslipidemia Lipid panel today. Not taking Pravastatin. Refilled today.      Lyndee Hensen, DO PGY-3, [facility] 05/17/2021

## 2021-05-16 LAB — COMPREHENSIVE METABOLIC PANEL
ALT: 9 IU/L (ref 0–32)
AST: 12 IU/L (ref 0–40)
Albumin/Globulin Ratio: 1.8 (ref 1.2–2.2)
Albumin: 4 g/dL (ref 3.7–4.7)
Alkaline Phosphatase: 68 IU/L (ref 44–121)
BUN/Creatinine Ratio: 26 (ref 12–28)
BUN: 21 mg/dL (ref 8–27)
Bilirubin Total: 0.4 mg/dL (ref 0.0–1.2)
CO2: 24 mmol/L (ref 20–29)
Calcium: 9.1 mg/dL (ref 8.7–10.3)
Chloride: 102 mmol/L (ref 96–106)
Creatinine, Ser: 0.8 mg/dL (ref 0.57–1.00)
Globulin, Total: 2.2 g/dL (ref 1.5–4.5)
Glucose: 88 mg/dL (ref 65–99)
Potassium: 4.2 mmol/L (ref 3.5–5.2)
Sodium: 142 mmol/L (ref 134–144)
Total Protein: 6.2 g/dL (ref 6.0–8.5)
eGFR: 77 mL/min/{1.73_m2} (ref >59)

## 2021-05-16 LAB — LIPID PANEL
Chol/HDL Ratio: 3.8 ratio (ref 0.0–4.4)
Cholesterol, Total: 220 mg/dL — ABNORMAL HIGH (ref 100–199)
HDL: 58 mg/dL (ref >39)
LDL Chol Calc (NIH): 147 mg/dL — ABNORMAL HIGH (ref 0–99)
Triglycerides: 85 mg/dL (ref 0–149)
VLDL Cholesterol Cal: 15 mg/dL (ref 5–40)

## 2021-05-16 LAB — VITAMIN D 25 HYDROXY (VIT D DEFICIENCY, FRACTURES): Vit D, 25-Hydroxy: 18.3 ng/mL — ABNORMAL LOW (ref 30.0–100.0)

## 2021-05-17 ENCOUNTER — Encounter: Payer: Self-pay | Admitting: Family Medicine

## 2021-05-17 MED ORDER — ERGOCALCIFEROL 1.25 MG (50000 UT) PO CAPS: 50000.0000 [IU] | ORAL_CAPSULE | ORAL | 0 refills | Status: AC

## 2021-05-17 NOTE — Assessment & Plan Note (Signed)
Previous T score -2.5 improved with Fosamax.  BMP to check Ca and check Vit D level today.  Has not been taking calcium or Vit D regularly.

## 2021-05-17 NOTE — Assessment & Plan Note (Addendum)
Lipid panel today. Not taking Pravastatin. Refilled today.

## 2021-05-27 ENCOUNTER — Other Ambulatory Visit: Payer: Self-pay

## 2021-05-27 ENCOUNTER — Ambulatory Visit (INDEPENDENT_AMBULATORY_CARE_PROVIDER_SITE_OTHER): Payer: Medicare Other | Admitting: Family Medicine

## 2021-05-27 ENCOUNTER — Encounter: Payer: Self-pay | Admitting: Family Medicine

## 2021-05-27 VITALS — BP 128/68 | HR 84 | Ht 66.0 in | Wt 200.0 lb

## 2021-05-27 DIAGNOSIS — E785 Hyperlipidemia, unspecified: Secondary | ICD-10-CM

## 2021-05-27 DIAGNOSIS — N281 Cyst of kidney, acquired: Secondary | ICD-10-CM | POA: Diagnosis not present

## 2021-05-27 DIAGNOSIS — E559 Vitamin D deficiency, unspecified: Secondary | ICD-10-CM | POA: Diagnosis not present

## 2021-05-27 DIAGNOSIS — R109 Unspecified abdominal pain: Secondary | ICD-10-CM | POA: Diagnosis not present

## 2021-05-27 LAB — POCT URINALYSIS DIP (MANUAL ENTRY)
Blood, UA: NEGATIVE
Glucose, UA: NEGATIVE mg/dL
Leukocytes, UA: NEGATIVE
Nitrite, UA: NEGATIVE
Protein Ur, POC: 30 mg/dL — AB
Spec Grav, UA: >=1.03 — AB (ref 1.010–1.025)
Urobilinogen, UA: 0.2 E.U./dL
pH, UA: 5.5 (ref 5.0–8.0)

## 2021-05-27 MED ORDER — ROSUVASTATIN CALCIUM 20 MG PO TABS: 20.0000 mg | ORAL_TABLET | Freq: Every day | ORAL | 3 refills | Status: DC

## 2021-05-27 NOTE — Progress Notes (Signed)
   SUBJECTIVE:   CHIEF COMPLAINT / HPI:    Andrea Buck is a 75 y.o. female here for right flank pain that start months ago. Pain is described as sharp and is intermittent. Does have some right sided lower back pain. Denies dysuria, hematuria, blubbly urine, fevers, weight loss, abdominal pain, dysuria, blood in urine. No bowel or bladder incontinence.  Takes 1000 mg Tylenol for pain without relief. Has seen a nephrologist in the past but unsure why. No history of kidney stones. Has a cyst on her right kidney.   Vit D Def She is taking her Vit D supplements.   HLD  Takes pravastatin 40 mg daily.   PERTINENT  PMH / PSH: reviewed and updated as appropriate   OBJECTIVE:   BP 128/68   Pulse 84   Ht '5\' 6"'$  (1.676 m)   Wt 200 lb (90.7 kg)   SpO2 96%   BMI 32.28 kg/m    GEN: pleasant elderly female, in no acute distress  CV: regular rate and rhythm RESP: no increased work of breathing, clear to ascultation bilaterally  ABD: Bowel sounds present. Soft, Nontender, Nondistended.  MSK: right flank non-tender, no paraspinal or midline lumbar or thoracic tenderness, Negative straight leg raise  SKIN: warm, dry   ASSESSMENT/PLAN:   Dyslipidemia Switch to Crestor. LDL 147, not at goal. The 10-year ASCVD risk score Mikey Bussing DC Jr., et al., 2013) is: 16.9%    Vitamin D deficiency Taking Vit D supplements. Repeat in 3 months   Flank pain Pt is a 75 yo female with ongoing right flank pain for past month. Pain not reproducible on exam. Not likely MSK. No hx of kidney stones. No fevers or dysuria to suggest UTI. UA without evidence of pyuria. Urine suggests dehydration. She does have known right renal cyst seen previously on CT and [place] measuring 6-7cm.   Advised to drink plenty of fluids. Repeat renal US. Follow up after ultrasound scheduled for next Monday.      Lyndee Hensen, DO PGY-3, [facility] 05/27/2021

## 2021-05-27 NOTE — Patient Instructions (Addendum)
It was great seeing you today!  Visit Remembers: - Stop by the pharmacy to pick up your prescriptions  - Continue to work on your healthy eating habits and incorporating exercise into your daily life.  - Your goal is to have an BP < 130/80 - Medicine Changes: Take 1500 mg Tylenol twice a day for pain  - To Do: go get your ultrasound on August 8th as scheduled (see follow up appointments below)    Be sure to call your gut doctor about scheduling your colonoscopy.  Address: [address], [place], [place] [address] Phone: [phone] Please bring all of your medications with you to each visit.    If you haven't already, sign up for My Chart to have easy access to your labs results, and communication with your primary care physician.  Feel free to call with any questions or concerns at any time, at [phone].   Take care,  Dr. Rushie [facility]

## 2021-05-29 DIAGNOSIS — E559 Vitamin D deficiency, unspecified: Secondary | ICD-10-CM | POA: Insufficient documentation

## 2021-05-29 DIAGNOSIS — R109 Unspecified abdominal pain: Secondary | ICD-10-CM | POA: Insufficient documentation

## 2021-05-29 DIAGNOSIS — R10A0 Flank pain, unspecified side: Secondary | ICD-10-CM | POA: Insufficient documentation

## 2021-05-29 NOTE — Assessment & Plan Note (Signed)
Pt is a 75 yo female with ongoing right flank pain for past month. Pain not reproducible on exam. Not likely MSK. No hx of kidney stones. No fevers or dysuria to suggest UTI. UA without evidence of pyuria. Urine suggests dehydration. She does have known right renal cyst seen previously on CT and [place] measuring 6-7cm.   Advised to drink plenty of fluids. Repeat renal US. Follow up after ultrasound scheduled for next Monday.

## 2021-05-29 NOTE — Assessment & Plan Note (Signed)
Taking Vit D supplements. Repeat in 3 months

## 2021-05-29 NOTE — Assessment & Plan Note (Signed)
Switch to Crestor. LDL 147, not at goal. The 10-year ASCVD risk score Mikey Bussing DC Brooke Bonito., et al., 2013) is: 16.9%

## 2021-06-03 ENCOUNTER — Ambulatory Visit
Admission: RE | Admit: 2021-06-03 | Discharge: 2021-06-03 | Disposition: A | Discharge status: Home / Self Care | Payer: Medicare Other | Source: Ambulatory Visit | Attending: Family Medicine | Admitting: Family Medicine

## 2021-06-03 ENCOUNTER — Other Ambulatory Visit: Payer: Self-pay

## 2021-06-03 DIAGNOSIS — N281 Cyst of kidney, acquired: Secondary | ICD-10-CM

## 2021-06-06 ENCOUNTER — Telehealth: Payer: Self-pay

## 2021-06-06 NOTE — Telephone Encounter (Signed)
LVM to have pt call back to schedule AWV.   RE: confirm insurance and schedule AWV on my schedule if times are convenient for patient or other AWV schedule as template permits.   

## 2021-06-12 ENCOUNTER — Encounter: Payer: Self-pay | Admitting: Family Medicine

## 2021-06-24 ENCOUNTER — Ambulatory Visit: Payer: Medicare Other

## 2021-06-24 NOTE — Progress Notes (Deleted)
   SUBJECTIVE:   CHIEF COMPLAINT / HPI:   No chief complaint on file.    Andrea Buck is a 75 y.o. female here for ***   Pt reports ***    PERTINENT  PMH / PSH: reviewed and updated as appropriate   OBJECTIVE:   There were no vitals taken for this visit.  ***  ASSESSMENT/PLAN:   No problem-specific Assessment & Plan notes found for this encounter.     Lyndee Hensen, DO PGY-3, [facility] 06/24/2021      {    This will disappear when note is signed, click to select method of visit    :1}

## 2021-07-16 ENCOUNTER — Other Ambulatory Visit: Payer: Self-pay

## 2021-07-16 NOTE — Progress Notes (Signed)
   SUBJECTIVE:   CHIEF COMPLAINT / HPI:    Andrea Buck is a 75 y.o. female here for follow up of:  Midback pain Continues to have intermittent right sided back pain. She has been taking an herbal supplement which seems to be helping. She does not remember the name of the supplement however. She has a known renal cyst. She denies pain today. Continues to take Vit D and calcium supplements.   Cough Reports feeling more hoarse with intermittent coughing and clearing of her throat. She follows with Dr. Joya Gaskins and Dr. Melvyn Novas. Has not spoken to them recently about her hoarseness.  Pt had a another surgery in March. Continues to take reflux medications but reports problem is not similar to her reflux symptoms. No fever, congestion, ear pain, headaches, vomiting, diarrhea, joint or abdominal pain. COVID vaccinated and boosted.   HTN Takes Losartan-HCTZ. Denies missing doses of antihypertensive medications. Denies chest pain, palpitations, lower extremity edema, exertional dyspnea, lightheadedness, headaches and vision changes.    PERTINENT  PMH / PSH: reviewed and updated as appropriate   OBJECTIVE:   BP (!) 146/78   Pulse 66   Ht 5\' 6"  (1.676 m)   Wt 197 lb 6.4 oz (89.5 kg)   SpO2 99%   BMI 31.86 kg/m    GEN: pleasant well appearing female, in no acute distress  HENT: scratchy low volume voice, moist membranes, no oral lesion, no erythema, no exudates  CV: regular rate and rhythm, no murmurs appreciated  RESP: no increased work of breathing, clear to ascultation bilaterally, no wheezing, rales, rhonchi MSK: no LE edema, or calf tenderness SKIN: warm, dry, no rash on visible skin    ASSESSMENT/PLAN:   HYPERTENSION, BENIGN SYSTEMIC Stable. Continue Losartan and HCTZ at current dose. Pt did not take her medication this morning as she was rushing out the door.   Asthmatic bronchitis , chronic (HCC)  vs UACS Follows with pulmonologist, Dr. Melvyn Novas who is planning PFTs and Dr. Joya Gaskins,  ENT. Advised pt to follow up with their offices and she voiced understanding. Trial prednisone burst to see if she has improvement in cough.   Osteopenia of neck of femur Most recentT score -1.1. Pt supplementing with Vit D and Calcium. FRAX score 3.6% for hip and 13% for other osteoporosis fracture.  Treat postmenopausal osteoporosis prophylactically with Fosamax 35 mg weekly.  Back pain Stable. Has large right sided renal cyst on recent renal US. Suspect pain may be referred kidney related pain vs muscle strain. Advised to bring in this herbal supplement or take to her pharmacy to ensure no interaction with current medications.     Vaccines: Influenza given today.    Lyndee Hensen, DO PGY-3, [facility] 07/21/2021

## 2021-07-17 ENCOUNTER — Other Ambulatory Visit: Payer: Self-pay

## 2021-07-17 ENCOUNTER — Encounter: Payer: Self-pay | Admitting: Family Medicine

## 2021-07-17 ENCOUNTER — Ambulatory Visit (INDEPENDENT_AMBULATORY_CARE_PROVIDER_SITE_OTHER): Payer: Medicare Other | Admitting: Family Medicine

## 2021-07-17 VITALS — BP 146/78 | HR 66 | Ht 66.0 in | Wt 197.4 lb

## 2021-07-17 DIAGNOSIS — J449 Chronic obstructive pulmonary disease, unspecified: Secondary | ICD-10-CM | POA: Diagnosis not present

## 2021-07-17 DIAGNOSIS — M85859 Other specified disorders of bone density and structure, unspecified thigh: Secondary | ICD-10-CM

## 2021-07-17 DIAGNOSIS — G8929 Other chronic pain: Secondary | ICD-10-CM

## 2021-07-17 DIAGNOSIS — M546 Pain in thoracic spine: Secondary | ICD-10-CM

## 2021-07-17 DIAGNOSIS — Z23 Encounter for immunization: Secondary | ICD-10-CM

## 2021-07-17 DIAGNOSIS — N281 Cyst of kidney, acquired: Secondary | ICD-10-CM

## 2021-07-17 MED ORDER — ALENDRONATE SODIUM 35 MG PO TABS: 35.0000 mg | ORAL_TABLET | ORAL | 3 refills | Status: DC

## 2021-07-17 MED ORDER — PREDNISONE 20 MG PO TABS: 40.0000 mg | ORAL_TABLET | Freq: Every day | ORAL | 0 refills | Status: AC

## 2021-07-17 NOTE — Patient Instructions (Signed)
Thank you for coming into the office today.   As discussed, stop by the pharmacy to pick up your prescription. Take 2 tablets twice a day for the next 5 days. Be sure to call and schedule a follow up appointment with both your lung and ears, nose and throat doctors.   Continue taking your medications daily!   Take Care,   Dr. Susa Simmonds

## 2021-07-21 ENCOUNTER — Encounter: Payer: Self-pay | Admitting: Family Medicine

## 2021-07-21 DIAGNOSIS — M85859 Other specified disorders of bone density and structure, unspecified thigh: Secondary | ICD-10-CM | POA: Insufficient documentation

## 2021-07-21 NOTE — Assessment & Plan Note (Addendum)
Most recentT score -1.1. Pt supplementing with Vit D and Calcium. FRAX score 3.6% for hip and 13% for other osteoporosis fracture.  Treat postmenopausal osteoporosis prophylactically with Fosamax 35 mg weekly.

## 2021-07-21 NOTE — Assessment & Plan Note (Addendum)
Follows with pulmonologist, Dr. Melvyn Novas who is planning PFTs and Dr. Joya Gaskins, ENT. Advised pt to follow up with their offices and she voiced understanding. Trial prednisone burst to see if she has improvement in cough.

## 2021-07-21 NOTE — Assessment & Plan Note (Signed)
Stable. Has large right sided renal cyst on recent renal US. Suspect pain may be referred kidney related pain vs muscle strain. Advised to bring in this herbal supplement or take to her pharmacy to ensure no interaction with current medications.

## 2021-07-21 NOTE — Assessment & Plan Note (Signed)
Stable. Continue Losartan and HCTZ at current dose. Pt did not take her medication this morning as she was rushing out the door.

## 2021-08-16 ENCOUNTER — Other Ambulatory Visit: Payer: Self-pay | Admitting: Family Medicine

## 2021-08-16 DIAGNOSIS — Z1231 Encounter for screening mammogram for malignant neoplasm of breast: Secondary | ICD-10-CM

## 2021-09-04 ENCOUNTER — Ambulatory Visit: Payer: Medicare Other | Admitting: Primary Care

## 2021-09-04 ENCOUNTER — Encounter: Payer: Self-pay | Admitting: Primary Care

## 2021-09-04 ENCOUNTER — Other Ambulatory Visit: Payer: Self-pay

## 2021-09-04 ENCOUNTER — Ambulatory Visit (INDEPENDENT_AMBULATORY_CARE_PROVIDER_SITE_OTHER): Payer: Medicare Other | Admitting: Internal Medicine

## 2021-09-04 VITALS — BP 136/88 | HR 80 | Temp 98.6°F | Ht 63.0 in | Wt 200.6 lb

## 2021-09-04 DIAGNOSIS — J449 Chronic obstructive pulmonary disease, unspecified: Secondary | ICD-10-CM

## 2021-09-04 DIAGNOSIS — K219 Gastro-esophageal reflux disease without esophagitis: Secondary | ICD-10-CM

## 2021-09-04 DIAGNOSIS — J3800 Paralysis of vocal cords and larynx, unspecified: Secondary | ICD-10-CM | POA: Diagnosis not present

## 2021-09-04 LAB — PULMONARY FUNCTION TEST
DL/VA % pred: 94 %
DL/VA: 3.91 ml/min/mmHg/L
DLCO cor % pred: 91 %
DLCO cor: 16.88 ml/min/mmHg
DLCO unc % pred: 91 %
DLCO unc: 16.88 ml/min/mmHg
FEF 25-75 Post: 1.51 L/sec
FEF 25-75 Pre: 3.71 L/sec
FEF2575-%Change-Post: -59 %
FEF2575-%Pred-Post: 108 %
FEF2575-%Pred-Pre: 266 %
FEV1-%Change-Post: -16 %
FEV1-%Pred-Post: 133 %
FEV1-%Pred-Pre: 159 %
FEV1-Post: 2.12 L
FEV1-Pre: 2.53 L
FEV1FVC-%Change-Post: 4 %
FEV1FVC-%Pred-Pre: 116 %
FEV6-%Change-Post: -20 %
FEV6-%Pred-Post: 116 %
FEV6-%Pred-Pre: 145 %
FEV6-Post: 2.28 L
FEV6-Pre: 2.86 L
FEV6FVC-%Pred-Post: 104 %
FEV6FVC-%Pred-Pre: 104 %
FVC-%Change-Post: -20 %
FVC-%Pred-Post: 111 %
FVC-%Pred-Pre: 138 %
FVC-Post: 2.28 L
FVC-Pre: 2.86 L
Post FEV1/FVC ratio: 93 %
Post FEV6/FVC ratio: 100 %
Pre FEV1/FVC ratio: 89 %
Pre FEV6/FVC Ratio: 100 %
RV % pred: 82 %
RV: 1.87 L
TLC % pred: 98 %
TLC: 4.84 L

## 2021-09-04 NOTE — Patient Instructions (Addendum)
Recommendations: - Trial off Symbicort; use albuterol 2 puffs every 4-6 hours as needed  (if breathing symptoms worsen off Symbicort then resume) - Continue Atrovent and/or flonase nasal spray - Continue mucinex 600mg  twice a day  - Continue reflux medication as directed   Referral: - Speech therapy re: vocal cord paralysis   Follow-up: - 6 months with Dr. Melvyn Novas or sooner if needed

## 2021-09-04 NOTE — Progress Notes (Signed)
@Patient  ID: Andrea Buck, female    DOB: 26-Aug-1946, 74 y.o.   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  Past medical history significant for asthmatic bronchitis versus upper airway cough syndrome, seasonal allergic rhinitis, vocal cord paralysis, GERD, history of COVID-19, obesity.  Patient of Dr. Melvyn Novas, seen for original consult on 03/08/2021 for asthmatic bronchitis.  Previous [facility] pulmonary encounter:  04/09/2021 Patient presents today for 1 month follow-up. Cough and SOB worse over the last 2 months. During her last visit she was given Augmentin BID x 10 days and depo 120mg  IM for suspected sinus disease contributing to cough. She is feeling better but still having some issues with nasal congestion. Mucus is thick, she has some voice hoarseness at night. She has a hard time getting mucus up. She is taking Mucinex twice daily and is using Flonase. Hx right vocal cord paralysis with modest midfold atrophy, seen by Dr. Joya Gaskins at [facility]. Reflux has improved some, she is consistent with taking Protonix BID and Pepcid. She is on Symbicort 32mcg two puffs twice a day. She has not had to use her albuterol rescue inhaler   Consider full PFTs to sort out upper vs lower airway obstruction if not improving on RX.    09/04/2021- Interim hx  Patient presents today for follow-up with PFTs. She is doing well today, no acute complaints. She reports having no specific breathing problems, she has a lot of upper airways congestion that will get stuck in her throat. She is taking mucinex and nasonex nasal spray as directed. She saw Dr. Joya Gaskins with [facility] in  July for vocal cord paralysis and recommended to try Atrovent nasal spray. No further interventions. She will see him back in 1 year.   Pulmonary function testing: 09/04/2021 FVC 2.28 (111%), FEV1 2.12 (133%), ratio 93, TLC 98%, DLCOunc 91%   No Known Allergies  Immunization History  Administered Date(s) Administered   Fluad Quad(high Dose 65+) 07/17/2021   Influenza, High Dose Seasonal PF 12/25/2020   Influenza,inj,Quad PF,6+ Mos 07/21/2013, 10/23/2015, 07/01/2017, 07/22/2018, 07/12/2019   Influenza-Unspecified 07/26/2016   Moderna Sars-Covid-2 Vaccination 11/29/2019, 12/27/2019, 08/20/2020   Pneumococcal Conjugate-13 06/15/2015   Pneumococcal Polysaccharide-23 02/21/2011, 12/05/2011   Td 05/28/1999    Past Medical History:  Diagnosis Date   Acute cystitis with hematuria 07/22/2018   Anxiety and depression 09/06/2018   Blood transfusion    after hysterectomy   Bronchitis, chronic ([place]) 2005   Carpal tunnel syndrome 12/24/2006   Qualifier: Diagnosis of  By: Shelbie Proctor     CHOLELITHIASIS 04/25/2009   Qualifier: Diagnosis of  By: Genene Churn MD, Jessica     COPD (chronic obstructive pulmonary disease) ([place])    Depression    GASTROESOPHAGEAL REFLUX, NO ESOPHAGITIS 12/24/2006   Qualifier: Diagnosis of  By: Oneita Jolly TUMOR 2001   Qualifier: Diagnosis of  By: Genene Churn MD, Jessica     Hyperlipidemia    Hypertension    MENOPAUSAL SYNDROME 12/24/2006   Qualifier: Diagnosis of  By: Shelbie Proctor  NECK MASS 12/29/2006   Qualifier: Diagnosis of  By: Laurance Flatten CMA, [place]     Sciatica 02/10/2017   Screening for hypothyroidism 07/21/2017   Vocal cord paralysis 05/26/2011   RIGHT vocal cord parlaysis Seen by Dr,. Pricilla Riffle at [facility].( May 03, 2011)  Placed on scopalamine patch and w f/u [place] [facility]     Tobacco History: Social History   Tobacco Use  Smoking Status Never  Smokeless Tobacco Never  Tobacco Comments   no plans to start    Counseling given: Not Answered Tobacco comments: no plans to start   Outpatient Medications Prior to Visit  Medication Sig Dispense Refill   albuterol (VENTOLIN HFA) 108 (90 Base) MCG/ACT inhaler Inhale 2 puffs into the lungs every 6 (six) hours as needed for wheezing or shortness of breath.     alendronate (FOSAMAX) 35 MG tablet Take 1 tablet (35 mg total) by mouth every 7 (seven) days. Take with a full glass of water on an empty stomach. 4 tablet 3   aspirin EC 81 MG tablet Take by mouth.     Calcium Carbonate-Vitamin D3 (CALCIUM 600/VITAMIN D) 600-400 MG-UNIT TABS Take 2 tablets by mouth daily with meal. 180 tablet 3   cetirizine (ZYRTEC) 10 MG tablet Take 1 tablet (10 mg total) by mouth daily. 30 tablet 11   Ergocalciferol 50 MCG (2000 UT) TABS Take 1 tablet by mouth daily. 90 tablet 3   famotidine (PEPCID) 20 MG tablet One after supper 30 tablet 11   gabapentin (NEURONTIN) 300 MG capsule Take two capsules at night. 90 capsule 3   hydrochlorothiazide (HYDRODIURIL) 25 MG tablet Take 1 tablet (25 mg total) by mouth daily. 90 tablet 3   losartan (COZAAR) 25 MG tablet Take 1 tablet (25 mg total) by mouth daily. 30 tablet 3   mometasone (NASONEX) 50 MCG/ACT nasal spray Place 1 spray into the nose daily. 17 g 3   Multiple Vitamin (MULTI-VITAMINS) TABS Take by mouth.     pantoprazole (PROTONIX) 40 MG tablet Take 1 tablet (40 mg total) by mouth daily. Take 30-60 min before first meal of the day 30 tablet 2   rosuvastatin (CRESTOR) 20 MG tablet Take 1 tablet (20 mg total) by mouth daily. 90 tablet 3   sertraline (ZOLOFT) 100 MG tablet Take 1.5 tablets (150 mg total) by mouth daily. 90 tablet 2   traZODone (DESYREL) 50 MG tablet Take 1 tablet (50 mg total) by mouth at bedtime. 90 tablet 3   No facility-administered medications prior to visit.    Review of Systems  Review of Systems  Constitutional: Negative.   HENT: Negative.    Respiratory:  Positive for cough. Negative for chest  tightness, shortness of breath and wheezing.     Physical Exam  BP 136/88 (BP Location: Right Arm, Patient Position: Sitting, Cuff Size: Normal)   Pulse 80   Temp 98.6 F (37 C) (Oral)   Ht 5\' 3"  (1.6 m)   Wt 200 lb 9.6 oz (91 kg)   SpO2 97%   BMI 35.53 kg/m  Physical Exam Constitutional:      Appearance: Normal appearance.  HENT:     Head: Normocephalic and atraumatic.  Cardiovascular:     Rate and Rhythm: Normal rate and regular rhythm.  Pulmonary:     Effort: Pulmonary effort is normal.     Breath sounds: Normal breath sounds.  Musculoskeletal:        General: Normal range of motion.  Skin:  General: Skin is warm and dry.  Neurological:     General: No focal deficit present.     Mental Status: She is alert and oriented to person, place, and time. Mental status is at baseline.  Psychiatric:        Mood and Affect: Mood normal.        Behavior: Behavior normal.        Thought Content: Thought content normal.        Judgment: Judgment normal.     Lab Results:  CBC    Component Value Date/Time   WBC 4.5 03/08/2021 1625   RBC 4.19 03/08/2021 1625   HGB 12.9 03/08/2021 1625   HGB 12.5 07/21/2017 0931   HCT 38.2 03/08/2021 1625   HCT 39.6 07/21/2017 0931   PLT 200 03/08/2021 1625   PLT 197 07/21/2017 0931   MCV 91.2 03/08/2021 1625   MCV 94 07/21/2017 0931   MCH 30.8 03/08/2021 1625   MCHC 33.8 03/08/2021 1625   RDW 13.0 03/08/2021 1625   RDW 13.9 07/21/2017 0931   LYMPHSABS 1,935 03/08/2021 1625   LYMPHSABS 1.5 07/21/2017 0931   MONOABS 0.3 07/21/2013 1129   EOSABS 108 03/08/2021 1625   EOSABS 0.2 07/21/2017 0931   BASOSABS 32 03/08/2021 1625   BASOSABS 0.0 07/21/2017 0931    BMET    Component Value Date/Time   NA 142 05/15/2021 1435   K 4.2 05/15/2021 1435   CL 102 05/15/2021 1435   CO2 24 05/15/2021 1435   GLUCOSE 88 05/15/2021 1435   GLUCOSE 102 (H) 10/29/2016 1601   BUN 21 05/15/2021 1435   CREATININE 0.80 05/15/2021 1435   CREATININE  0.68 10/29/2016 1601   CALCIUM 9.1 05/15/2021 1435   GFRNONAA 74 06/08/2019 1132   GFRAA 86 06/08/2019 1132    BNP No results found for: BNP  ProBNP No results found for: PROBNP  Imaging: No results found.   Assessment & Plan:   Asthmatic bronchitis , chronic (HCC)  vs UACS - PFTs today showed normal lung function,  flow volume loop consistent with variable extrathoracic obstruction likely d/t vocal cord parlysis. Patient has seen no clinical benefit from Symbicort 52mcg. Recommend trial off ICS/LABA and advised resuming if breathing worsens. Continue albuterol 2 puffs every 6 hours as needed for sob/wheezing.   Vocal cord paralysis - Following with [facility] otolaryngology, she saw Dr. Joya Gaskins in July 2022. She was given Atrovent nasal spray. No further recommendations. Needs fu in 1 year with them. Referring to speech therapy   GASTROESOPHAGEAL REFLUX, NO ESOPHAGITIS - Continue protonix 40mg  daily and pepcid 20mg  qhs    Martyn Ehrich, NP 09/04/2021

## 2021-09-04 NOTE — Assessment & Plan Note (Signed)
-   PFTs today showed normal lung function,  flow volume loop consistent with variable extrathoracic obstruction likely d/t vocal cord parlysis. Patient has seen no clinical benefit from Symbicort 26mcg. Recommend trial off ICS/LABA and advised resuming if breathing worsens. Continue albuterol 2 puffs every 6 hours as needed for sob/wheezing.

## 2021-09-04 NOTE — Assessment & Plan Note (Signed)
-   Continue protonix 40mg  daily and pepcid 20mg  qhs

## 2021-09-04 NOTE — Progress Notes (Signed)
PFT done today. 

## 2021-09-04 NOTE — Assessment & Plan Note (Addendum)
-   Following with [facility] otolaryngology, she saw Dr. Joya Gaskins in July 2022. She was given Atrovent nasal spray. No further recommendations. Needs fu in 1 year with them. Referring to speech therapy

## 2021-09-09 ENCOUNTER — Other Ambulatory Visit: Payer: Self-pay

## 2021-09-09 DIAGNOSIS — M25551 Pain in right hip: Secondary | ICD-10-CM

## 2021-09-09 DIAGNOSIS — M25552 Pain in left hip: Secondary | ICD-10-CM

## 2021-09-18 ENCOUNTER — Ambulatory Visit
Admission: RE | Admit: 2021-09-18 | Discharge: 2021-09-18 | Disposition: A | Discharge status: Home / Self Care | Payer: Medicare Other | Source: Ambulatory Visit | Attending: Family Medicine | Admitting: Family Medicine

## 2021-09-18 DIAGNOSIS — Z1231 Encounter for screening mammogram for malignant neoplasm of breast: Secondary | ICD-10-CM

## 2021-10-04 ENCOUNTER — Other Ambulatory Visit: Payer: Self-pay | Admitting: Internal Medicine

## 2021-10-04 DIAGNOSIS — J449 Chronic obstructive pulmonary disease, unspecified: Secondary | ICD-10-CM

## 2021-10-17 ENCOUNTER — Telehealth: Payer: Self-pay | Admitting: Family Medicine

## 2021-10-17 DIAGNOSIS — M25552 Pain in left hip: Secondary | ICD-10-CM

## 2021-10-17 MED ORDER — GABAPENTIN 300 MG PO CAPS: 300.0000 mg | ORAL_CAPSULE | Freq: Three times a day (TID) | ORAL | 0 refills | Status: DC

## 2021-10-17 NOTE — Telephone Encounter (Signed)
Patient is requesting a refill on her gabapentin. She said the pharmacy has sent multiple request but I do not see that in our system.   She would like someone to call her when this refill has been sent to the pharmacy.

## 2021-10-17 NOTE — Addendum Note (Signed)
Addended by: Lyndee Hensen D on: 10/17/2021 05:06 PM   Modules accepted: Orders

## 2021-11-04 ENCOUNTER — Ambulatory Visit (INDEPENDENT_AMBULATORY_CARE_PROVIDER_SITE_OTHER): Payer: No Typology Code available for payment source | Admitting: Family Medicine

## 2021-11-04 ENCOUNTER — Other Ambulatory Visit: Payer: Self-pay

## 2021-11-04 ENCOUNTER — Encounter: Payer: Self-pay | Admitting: Family Medicine

## 2021-11-04 VITALS — BP 145/94 | HR 62 | Ht 66.0 in | Wt 200.2 lb

## 2021-11-04 DIAGNOSIS — R109 Unspecified abdominal pain: Secondary | ICD-10-CM

## 2021-11-04 DIAGNOSIS — N281 Cyst of kidney, acquired: Secondary | ICD-10-CM | POA: Diagnosis not present

## 2021-11-04 DIAGNOSIS — Z961 Presence of intraocular lens: Secondary | ICD-10-CM | POA: Diagnosis not present

## 2021-11-04 NOTE — Progress Notes (Signed)
° °  SUBJECTIVE:   CHIEF COMPLAINT / HPI:   Chief Complaint  Patient presents with   Flank Pain     Andrea Buck is a 76 y.o. female here for follow up in her right flank pain.  She started several months ago.  At times she has to use a pillow at night to compress the area for pain.  She takes anti-inflammatory meds to help pain when needed.  Recently the pain has not been as bad.  She describes it as "tender".  She has a known right kidney cyst in that area.     PERTINENT  PMH / PSH: reviewed and updated as appropriate   OBJECTIVE:   BP (!) 145/94    Pulse 62    Ht 5\' 6"  (1.676 m)    Wt 200 lb 3.2 oz (90.8 kg)    SpO2 99%    BMI 32.31 kg/m    GEN: pleasant well appearing female, in no acute distress  CV: regular rate  RESP: no increased work of breathing ABD: Bowel sounds present. Soft, non-tender, non-distended. No CVA tenderness MSK: no tenderness to bilateral paraspinal muscles  SKIN: warm, dry, no rash on visible skin on overlying skin     ASSESSMENT/PLAN:   Flank pain Pt is a 76 yo female with intermittent right flank pain for about 6 months. [place] in Aug 2022 showed large right simple cyst (8cm x4cm).  She may be having mass effect on the right side. Discussed referral to urology and she is agreeable.  Referral placed for evaluation and possible drainage. Continue NSAIDs as needed.       Lyndee Hensen, DO PGY-3, [facility] 11/04/2021

## 2021-11-04 NOTE — Assessment & Plan Note (Signed)
Pt is a 76 yo female with intermittent right flank pain for about 6 months. [place] in Aug 2022 showed large right simple cyst (8cm x4cm).  She may be having mass effect on the right side. Discussed referral to urology and she is agreeable.  Referral placed for evaluation and possible drainage. Continue NSAIDs as needed.

## 2021-11-04 NOTE — Patient Instructions (Addendum)
Let me know if your flank pain gets worse. Will consider getting a CT scan.    Be sure to follow up with Dr. Dr. Justice Britain for your colonoscopy.  Call to schedule an appointment.  [facility]  Address: [address], [place], [place] [address] Phone: [phone]  Consider picking up some Valerian root to help with sleep. Continue taking Trazodone.

## 2021-11-04 NOTE — Progress Notes (Signed)
Called pt to discuss referral to urology and she is agreeable.  Referral placed.   Lyndee Hensen, DO

## 2021-11-27 DIAGNOSIS — N3946 Mixed incontinence: Secondary | ICD-10-CM | POA: Diagnosis not present

## 2021-11-27 DIAGNOSIS — R350 Frequency of micturition: Secondary | ICD-10-CM | POA: Diagnosis not present

## 2021-11-27 DIAGNOSIS — R351 Nocturia: Secondary | ICD-10-CM | POA: Diagnosis not present

## 2021-12-17 ENCOUNTER — Other Ambulatory Visit: Payer: Self-pay | Admitting: Family Medicine

## 2021-12-17 ENCOUNTER — Other Ambulatory Visit: Payer: Self-pay

## 2021-12-17 ENCOUNTER — Ambulatory Visit (INDEPENDENT_AMBULATORY_CARE_PROVIDER_SITE_OTHER): Payer: No Typology Code available for payment source

## 2021-12-17 DIAGNOSIS — M810 Age-related osteoporosis without current pathological fracture: Secondary | ICD-10-CM

## 2021-12-17 DIAGNOSIS — E559 Vitamin D deficiency, unspecified: Secondary | ICD-10-CM

## 2021-12-17 DIAGNOSIS — Z0000 Encounter for general adult medical examination without abnormal findings: Secondary | ICD-10-CM | POA: Diagnosis not present

## 2021-12-17 DIAGNOSIS — M85859 Other specified disorders of bone density and structure, unspecified thigh: Secondary | ICD-10-CM

## 2021-12-17 NOTE — Progress Notes (Signed)
Subjective:   Andrea Buck is a 76 y.o. female who presents for Medicare Annual (Subsequent) preventive examination.  Patient consented to have virtual visit and was identified by name and date of birth. Method of visit: Telephone  Encounter participants: Patient: Andrea Buck - located at Home Nurse/Provider: Dorna Bloom - located at [facility] Others (if applicable): NA  Review of Systems: Defer to PCP  Cardiac Risk Factors include: advanced age (>17men, >57 women);hypertension  Objective:   Vitals: There were no vitals taken for this visit.  There is no height or weight on file to calculate BMI.  Advanced Directives 12/17/2021 11/04/2021 07/17/2021 05/27/2021 05/15/2021 08/12/2019 06/08/2019  Does Patient Have a Medical Advance Directive? No No No No No No No  Would patient like information on creating a medical advance directive? 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- [employer]  Interpreter Needed?: No  Past Medical History:  Diagnosis Date   Acute cystitis with hematuria 07/22/2018   Anxiety and depression 09/06/2018   Blood transfusion    after hysterectomy   Bronchitis, chronic ([place]) 2005   Carpal tunnel syndrome 12/24/2006   Qualifier: Diagnosis of  By: Shelbie Proctor     CHOLELITHIASIS 04/25/2009   Qualifier: Diagnosis of  By: Genene Churn MD, Jessica     COPD (chronic  obstructive pulmonary disease) ([place])    Depression    GASTROESOPHAGEAL REFLUX, NO ESOPHAGITIS 12/24/2006   Qualifier: Diagnosis of  By: Oneita Jolly TUMOR 2001   Qualifier: Diagnosis of  By: Genene Churn MD, Jessica     Hyperlipidemia    Hypertension    MENOPAUSAL SYNDROME 12/24/2006   Qualifier: Diagnosis of  By: Shelbie Proctor     NECK MASS 12/29/2006   Qualifier: Diagnosis of  By: Laurance Flatten CMA, [place]     Sciatica 02/10/2017   Screening for hypothyroidism 07/21/2017   Vocal cord paralysis 05/26/2011   RIGHT vocal cord parlaysis Seen by Dr,. Pricilla Riffle at [facility].( May 03, 2011)  Placed on scopalamine patch and w f/u [facility]    Past Surgical History:  Procedure Laterality Date   ABDOMINAL HYSTERECTOMY     CARPAL TUNNEL RELEASE     CHOLECYSTECTOMY     LUMBAR LAMINECTOMY     (4-5)   palatal adhesion     removal of glomus vagales tumor-non cancerious     TUBAL LIGATION     UPPER GASTROINTESTINAL ENDOSCOPY  2006   Family History  Problem Relation Age of Onset   Cancer Mother        Uterus   Diabetes Mother    Hypertension Mother    Heart disease Father    Stroke Father    Hypertension Father    Diabetes Father    Obesity Sister    Diabetes Sister    Hypertension Sister  Diabetes Sister    Hypertension Sister    Diabetes Sister    Hypertension Sister    Diabetes Brother    Multiple sclerosis Brother    Diabetes Brother    Hypertension Brother    Mental illness Son        PTSD   Diabetes Son    Hypertension Son    Hyperlipidemia Son    Obesity Son    Drug abuse Son    Arthritis Son    Drug abuse Son    Hyperlipidemia Son    Colon cancer Neg Hx    Colon polyps Neg Hx    Stomach cancer Neg Hx    Esophageal cancer Neg Hx    Rectal cancer Neg Hx    Social History   Socioeconomic History   Marital status: Widowed    Spouse name: Not on file   Number of children: 5   Years of education: 14   Highest education level:  Associate degree: occupational, Hotel manager, or vocational program  Occupational History   Occupation: LPN    Employer: [employer]  Tobacco Use   Smoking status: Never    Passive exposure: Never   Smokeless tobacco: Never  Vaping Use   Vaping Use: Never used  Substance and Sexual Activity   Alcohol use: No   Drug use: No   Sexual activity: Yes    Birth control/protection: Post-menopausal    Comment: Hysterectomy  Other Topics Concern   Not on file  Social History Narrative   Emergency Contact: Daughter, [name] (c) [phone]   End of Life Plan: Info on [place] given   Who lives with you: Patient lives with daughter and son in [place]    Any pets: None   Exercise:Tries to exercise twice weekly   Seatbelts: Patient reports wearing her seatbelt when in vehicle.       Patient has stressors with her sons. Patient reports one has had multiple strokes and one is homeless.       Patient is happy with her current living situation as this has been an issue in the past. Patient encouraged to reach out if/when she would like to change this.    Social Determinants of Health   Financial Resource Strain: Low Risk    Difficulty of Paying Living Expenses: Not hard at all  Food Insecurity: Food Insecurity Present   Worried About Charity fundraiser in the Last Year: Sometimes true   Arboriculturist in the Last Year: Sometimes true  Transportation Needs: No Transportation Needs   Lack of Transportation (Medical): No   Lack of Transportation (Non-Medical): No  Physical Activity: Insufficiently Active   Days of Exercise per Week: 2 days   Minutes of Exercise per Session: 40 min  Stress: No Stress Concern Present   Feeling of Stress : Only a little  Social Connections: Moderately Integrated   Frequency of Communication with Friends and Family: More than three times a week   Frequency of Social Gatherings with Friends and Family: More than three times a week   Attends Religious  Services: More than 4 times per year   Active Member of [employer] or Organizations: Yes   Attends Archivist Meetings: More than 4 times per year   Marital Status: Widowed   Outpatient Encounter Medications as of 12/17/2021  Medication Sig   albuterol (VENTOLIN HFA) 108 (90 Base) MCG/ACT inhaler Inhale 2 puffs into the lungs every 6 (six) hours as needed for  wheezing or shortness of breath.   Calcium Carbonate-Vitamin D3 (CALCIUM 600/VITAMIN D) 600-400 MG-UNIT TABS Take 2 tablets by mouth daily with meal.   cetirizine (ZYRTEC) 10 MG tablet Take 1 tablet (10 mg total) by mouth daily.   famotidine (PEPCID) 20 MG tablet One after supper   hydrochlorothiazide (HYDRODIURIL) 25 MG tablet Take 1 tablet (25 mg total) by mouth daily.   losartan (COZAAR) 25 MG tablet Take 1 tablet (25 mg total) by mouth daily.   mometasone (NASONEX) 50 MCG/ACT nasal spray Place 1 spray into the nose daily.   Multiple Vitamin (MULTI-VITAMINS) TABS Take by mouth.   pantoprazole (PROTONIX) 40 MG tablet TAKE 1 TABLET BY MOUTH ONCE DAILY. TAKE 30-60 MIN BEFORE FIRST MEAL OF THE DAY.   rosuvastatin (CRESTOR) 20 MG tablet Take 1 tablet (20 mg total) by mouth daily.   sertraline (ZOLOFT) 100 MG tablet Take 1.5 tablets (150 mg total) by mouth daily.   traZODone (DESYREL) 50 MG tablet Take 1 tablet (50 mg total) by mouth at bedtime.   Vibegron (GEMTESA) 75 MG TABS Take 1 mg by mouth.   [DISCONTINUED] alendronate (FOSAMAX) 35 MG tablet Take 1 tablet (35 mg total) by mouth every 7 (seven) days. Take with a full glass of water on an empty stomach.   [DISCONTINUED] gabapentin (NEURONTIN) 300 MG capsule Take 1 capsule (300 mg total) by mouth 3 (three) times daily.   aspirin EC 81 MG tablet Take by mouth. (Patient not taking: Reported on 12/17/2021)   Ergocalciferol 50 MCG (2000 UT) TABS Take 1 tablet by mouth daily. (Patient not taking: Reported on 12/17/2021)   No facility-administered encounter medications on file as of  12/17/2021.   Activities of Daily Living In your present state of health, do you have any difficulty performing the following activities: 12/17/2021  Hearing? N  Vision? N  Difficulty concentrating or making decisions? N  Walking or climbing stairs? N  Dressing or bathing? N  Doing errands, shopping? N  Preparing Food and eating ? N  Using the Toilet? N  In the past six months, have you accidently leaked urine? Y  Do you have problems with loss of bowel control? N  Managing your Medications? N  Managing your Finances? N  Housekeeping or managing your Housekeeping? N  Some recent data might be hidden   Patient Care Team: Lyndee Hensen, DO as PCP - General (Family Medicine) Ernestine Conrad, MD (Otolaryngology) Tanda Rockers, MD as Consulting Physician (Pulmonary Disease) Drake Leach, [place] (Optometry)    Assessment:   This is a routine wellness examination for [place].  Exercise Activities and Dietary recommendations Current Exercise Habits: Home exercise routine, Time (Minutes): 40, Frequency (Times/Week): 2, Weekly Exercise (Minutes/Week): 80, Intensity: Moderate, Exercise limited by: psychological condition(s);cardiac condition(s)   Goals      Blood Pressure < 140/90     Exercise 3x per week (30 min per time)     Has been exercising 2x per week for 37minutes (walking.) 11/2021     Increase water intake       Fall Risk Fall Risk  12/17/2021 11/04/2021 07/17/2021 05/27/2021 05/15/2021  Falls in the past year? 1 0 0 0 0  Number falls in past yr: 1 0 0 0 0  Injury with Fall? 0 0 0 0 0  Risk for fall due to : No Fall Risks - - - -  Follow up Falls prevention discussed - - - -   Is the patient's home free of loose  throw rugs in walkways, pet beds, electrical cords, etc?   yes      Grab bars in the bathroom? yes      Handrails on the stairs?   yes      Adequate lighting?   yes  Patient rating of health (0-10) scale: 8   Depression Screen PHQ 2/9 Scores 12/17/2021  07/17/2021 05/15/2021 11/28/2019  PHQ - 2 Score 2 2 3 6   PHQ- 9 Score 9 10 13 20     Cognitive Function MMSE - Mini Mental State Exam 08/05/2018 06/24/2012 02/21/2011  Orientation to time 5 5 5   Orientation to Place 5 5 5   Registration 3 3 3   Attention/ Calculation 5 2 5   Recall 3 3 3   Language- name 2 objects 2 2 2   Language- repeat 1 1 1   Language- follow 3 step command 3 3 3   Language- read & follow direction 1 1 1   Write a sentence 1 1 1   Copy design 1 1 1   Total score 30 27 30    6CIT Screen 12/17/2021 08/05/2018  What Year? 0 points 0 points  What month? 0 points 0 points  What time? 0 points 0 points  Count back from 20 0 points 0 points  Months in reverse 0 points 0 points  Repeat phrase 0 points 0 points  Total Score 0 0   Immunization History  Administered Date(s) Administered   Fluad Quad(high Dose 65+) 07/17/2021   Influenza, High Dose Seasonal PF 12/25/2020   Influenza,inj,Quad PF,6+ Mos 07/21/2013, 10/23/2015, 07/01/2017, 07/22/2018, 07/12/2019   Influenza-Unspecified 07/26/2016   Moderna Sars-Covid-2 Vaccination 11/29/2019, 12/27/2019, 08/20/2020   Pneumococcal Conjugate-13 06/15/2015   Pneumococcal Polysaccharide-23 02/21/2011, 12/05/2011   Td 05/28/1999   Zoster Recombinat (Shingrix) 12/17/2021   Screening Tests Health Maintenance  Topic Date Due   TETANUS/TDAP  05/27/2009   COVID-19 Vaccine (4 - Booster for Moderna series) 10/15/2020   COLONOSCOPY (Pts 45-58yrs Insurance coverage will need to be confirmed)  07/28/2021   Zoster Vaccines- Shingrix (2 of 2) 02/11/2022   Pneumonia Vaccine 37+ Years old  Completed   INFLUENZA VACCINE  Completed   DEXA SCAN  Completed   Hepatitis C Screening  Completed   HPV VACCINES  Aged Out   Cancer Screenings: Lung: Low Dose CT Chest recommended if Age 72-80 years, 20 pack-year currently smoking OR have quit w/in 15years. Patient does not qualify. Breast:  Up to date on Mammogram? Yes  08/2021 Up to date of Bone  Density/Dexa? Yes Colorectal: Due- Last 07/2018  Additional Screenings: Hepatitis C Screening: Completed   Plan:  Follow-up with PCP as needed. Call [facility] [phone]. #2 Shingrix vaccine due 02/11/2022. Get your Tetanus Vaccine at the pharmacy.   I have personally reviewed and noted the following in the patients chart:   Medical and social history Use of alcohol, tobacco or illicit drugs  Current medications and supplements Functional ability and status Nutritional status Physical activity Advanced directives List of other physicians Hospitalizations, surgeries, and ER visits in previous 12 months Vitals Screenings to include cognitive, depression, and falls Referrals and appointments  In addition, I have reviewed and discussed with patient certain preventive protocols, quality metrics, and best practice recommendations. A written personalized care plan for preventive services as well as general preventive health recommendations were provided to patient.  This visit was conducted virtually in the setting of the [place] pandemic.    Dorna Bloom, [place]  12/24/2021

## 2021-12-18 ENCOUNTER — Other Ambulatory Visit: Payer: Self-pay

## 2021-12-18 NOTE — Telephone Encounter (Signed)
Disregard error. Andrea Buck, CMA

## 2021-12-24 NOTE — Progress Notes (Signed)
I have reviewed this visit and agree with the documentation.   

## 2021-12-24 NOTE — Patient Instructions (Signed)
You spoke to Andrea Buck, [place] over the phone for your annual wellness visit.  We discussed goals:   Goals      Blood Pressure < 140/90     Exercise 3x per week (30 min per time)     Has been exercising 2x per week for 81mnutes (walking.) 11/2021     Increase water intake       We also discussed recommended health maintenance. Please call our office and schedule a visit as needed. As discussed, you are due for:  Health Maintenance  Topic Date Due   TETANUS/TDAP  05/27/2009   COVID-19 Vaccine (4 - Booster for Moderna series) 10/15/2020   COLONOSCOPY (Pts 45-441yrInsurance coverage will need to be confirmed)  07/28/2021   Zoster Vaccines- Shingrix (2 of 2) 02/11/2022   Pneumonia Vaccine 6577Years old  Completed   INFLUENZA VACCINE  Completed   DEXA SCAN  Completed   Hepatitis C Screening  Completed   HPV VACCINES  Aged Out   Follow-up with PCP as needed. [facility] [phone]#2 Shingrix vaccine due 02/11/2022. Get your Tetanus Vaccine at the pharmacy.   Preventive Care 6565ears and Older, Female Preventive care refers to lifestyle choices and visits with your health care provider that can promote health and wellness. Preventive care visits are also called wellness exams. What can I expect for my preventive care visit? Counseling Your health care provider may ask you questions about your: Medical history, including: Past medical problems. Family medical history. Pregnancy and menstrual history. History of falls. Current health, including: Memory and ability to understand (cognition). Emotional well-being. Home life and relationship well-being. Sexual activity and sexual health. Lifestyle, including: Alcohol, nicotine or tobacco, and drug use. Access to firearms. Diet, exercise, and sleep habits. Work and work enStatisticianSunscreen use. Safety issues such as seatbelt and bike helmet use. Physical exam Your health care provider will check  your: Height and weight. These may be used to calculate your BMI (body mass index). BMI is a measurement that tells if you are at a healthy weight. Waist circumference. This measures the distance around your waistline. This measurement also tells if you are at a healthy weight and may help predict your risk of certain diseases, such as type 2 diabetes and high blood pressure. Heart rate and blood pressure. Body temperature. Skin for abnormal spots. What immunizations do I need? Vaccines are usually given at various ages, according to a schedule. Your health care provider will recommend vaccines for you based on your age, medical history, and lifestyle or other factors, such as travel or where you work. What tests do I need? Screening Your health care provider may recommend screening tests for certain conditions. This may include: Lipid and cholesterol levels. Hepatitis C test. Hepatitis B test. HIV (human immunodeficiency virus) test. STI (sexually transmitted infection) testing, if you are at risk. Lung cancer screening. Colorectal cancer screening. Diabetes screening. This is done by checking your blood sugar (glucose) after you have not eaten for a while (fasting). Mammogram. Talk with your health care provider about how often you should have regular mammograms. BRCA-related cancer screening. This may be done if you have a family history of breast, ovarian, tubal, or peritoneal cancers. Bone density scan. This is done to screen for osteoporosis. Talk with your health care provider about your test results, treatment options, and if necessary, the need for more tests. Follow these instructions at home: Eating and drinking  Eat a diet that includes  fresh fruits and vegetables, whole grains, lean protein, and low-fat dairy products. Limit your intake of foods with high amounts of sugar, saturated fats, and salt. Take vitamin and mineral supplements as recommended by your health care  provider. Do not drink alcohol if your health care provider tells you not to drink. If you drink alcohol: Limit how much you have to 0-1 drink a day. Know how much alcohol is in your drink. In the [place] one drink equals one 12 oz bottle of beer (355 mL), one 5 oz glass of wine (148 mL), or one 1 oz glass of hard liquor (44 mL). Lifestyle Brush your teeth every morning and night with fluoride toothpaste. Floss one time each day. Exercise for at least 30 minutes 5 or more days each week. Do not use any products that contain nicotine or tobacco. These products include cigarettes, chewing tobacco, and vaping devices, such as e-cigarettes. If you need help quitting, ask your health care provider. Do not use drugs. If you are sexually active, practice safe sex. Use a condom or other form of protection in order to prevent STIs. Take aspirin only as told by your health care provider. Make sure that you understand how much to take and what form to take. Work with your health care provider to find out whether it is safe and beneficial for you to take aspirin daily. Ask your health care provider if you need to take a cholesterol-lowering medicine (statin). Find healthy ways to manage stress, such as: Meditation, yoga, or listening to music. Journaling. Talking to a trusted person. Spending time with friends and family. Minimize exposure to UV radiation to reduce your risk of skin cancer. Safety Always wear your seat belt while driving or riding in a vehicle. Do not drive: If you have been drinking alcohol. Do not ride with someone who has been drinking. When you are tired or distracted. While texting. If you have been using any mind-altering substances or drugs. Wear a helmet and other protective equipment during sports activities. If you have firearms in your house, make sure you follow all gun safety procedures. What's next? Visit your health care provider once a year for an annual wellness  visit. Ask your health care provider how often you should have your eyes and teeth checked. Stay up to date on all vaccines. This information is not intended to replace advice given to you by your health care provider. Make sure you discuss any questions you have with your health care provider. Document Revised: 04/10/2021 Document Reviewed: 04/10/2021 Elsevier Patient Education  2022 [place] Prevention in the Home, Adult Falls can cause injuries and can happen to people of all ages. There are many things you can do to make your home safe and to help prevent falls. Ask for help when making these changes. What actions can I take to prevent falls? General Instructions Use good lighting in all rooms. Replace any light bulbs that burn out. Turn on the lights in dark areas. Use night-lights. Keep items that you use often in easy-to-reach places. Lower the shelves around your home if needed. Set up your furniture so you have a clear path. Avoid moving your furniture around. Do not have throw rugs or other things on the floor that can make you trip. Avoid walking on wet floors. If any of your floors are uneven, fix them. Add color or contrast paint or tape to clearly mark and help you see: Grab bars or handrails. First and  last steps of staircases. Where the edge of each step is. If you use a stepladder: Make sure that it is fully opened. Do not climb a closed stepladder. Make sure the sides of the stepladder are locked in place. Ask someone to hold the stepladder while you use it. Know where your pets are when moving through your home. What can I do in the bathroom?   Keep the floor dry. Clean up any water on the floor right away. Remove soap buildup in the tub or shower. Use nonskid mats or decals on the floor of the tub or shower. Attach bath mats securely with double-sided, nonslip rug tape. If you need to sit down in the shower, use a plastic, nonslip stool. Install grab  bars by the toilet and in the tub and shower. Do not use towel bars as grab bars. What can I do in the bedroom? Make sure that you have a light by your bed that is easy to reach. Do not use any sheets or blankets for your bed that hang to the floor. Have a firm chair with side arms that you can use for support when you get dressed. What can I do in the kitchen? Clean up any spills right away. If you need to reach something above you, use a step stool with a grab bar. Keep electrical cords out of the way. Do not use floor polish or wax that makes floors slippery. What can I do with my stairs? Do not leave any items on the stairs. Make sure that you have a light switch at the top and the bottom of the stairs. Make sure that there are handrails on both sides of the stairs. Fix handrails that are broken or loose. Install nonslip stair treads on all your stairs. Avoid having throw rugs at the top or bottom of the stairs. Choose a carpet that does not hide the edge of the steps on the stairs. Check carpeting to make sure that it is firmly attached to the stairs. Fix carpet that is loose or worn. What can I do on the outside of my home? Use bright outdoor lighting. Fix the edges of walkways and driveways and fix any cracks. Remove anything that might make you trip as you walk through a door, such as a raised step or threshold. Trim any bushes or trees on paths to your home. Check to see if handrails are loose or broken and that both sides of all steps have handrails. Install guardrails along the edges of any raised decks and porches. Clear paths of anything that can make you trip, such as tools or rocks. Have leaves, snow, or ice cleared regularly. Use sand or salt on paths during winter. Clean up any spills in your garage right away. This includes grease or oil spills. What other actions can I take? Wear shoes that: Have a low heel. Do not wear high heels. Have rubber bottoms. Feel good on  your feet and fit well. Are closed at the toe. Do not wear open-toe sandals. Use tools that help you move around if needed. These include: Canes. Walkers. Scooters. Crutches. Review your medicines with your doctor. Some medicines can make you feel dizzy. This can increase your chance of falling. Ask your doctor what else you can do to help prevent falls. Where to find more information Centers for Disease Control and Prevention, [place]: http://www.wolf.info/ National Institute on Aging: http://kim-miller.com/ Contact a doctor if: You are afraid of falling at home. You feel  weak, drowsy, or dizzy at home. You fall at home. Summary There are many simple things that you can do to make your home safe and to help prevent falls. Ways to make your home safe include removing things that can make you trip and installing grab bars in the bathroom. Ask for help when making these changes in your home. This information is not intended to replace advice given to you by your health care provider. Make sure you discuss any questions you have with your health care provider. Document Revised: 05/16/2020 Document Reviewed: 05/16/2020 Elsevier Patient Education  2022 [employer].   Our clinic's number is [phone]. Please call with questions or concerns about what we discussed today.

## 2022-01-09 ENCOUNTER — Other Ambulatory Visit: Payer: Self-pay | Admitting: Family Medicine

## 2022-01-10 DIAGNOSIS — N3946 Mixed incontinence: Secondary | ICD-10-CM | POA: Diagnosis not present

## 2022-01-10 DIAGNOSIS — R351 Nocturia: Secondary | ICD-10-CM | POA: Diagnosis not present

## 2022-01-23 ENCOUNTER — Telehealth: Payer: Self-pay | Admitting: *Deleted

## 2022-01-23 NOTE — Telephone Encounter (Signed)
Pt is requesting a refill of Mobic.  I do not see this on her current medication list.  Will forward to PCP.  ? ?If the patient needs: ? ? An appointment - route response to [facility] admin ? A callback from clinic staff - route response to your team ? ? ?Only route to RN Team: form completions or if RN team directly requests response sent to them ? ? ?Christen Bame, CMA ? ? ?

## 2022-02-19 MED ORDER — MELOXICAM 15 MG PO TABS: 15.0000 mg | ORAL_TABLET | Freq: Every day | ORAL | 0 refills | Status: DC

## 2022-02-19 NOTE — Addendum Note (Signed)
Addended by: Lyndee Hensen D on: 02/19/2022 12:26 PM ? ? Modules accepted: Orders ? ?

## 2022-02-19 NOTE — Telephone Encounter (Signed)
Patient calls back requesting a refill on Mobic.  ? ?Patient advised to make an apt as this medication is no longer on her medication list.  ? ?Patient stated, "I do not need to come in I was just there."  ? ?Patient states this is the only medication that helps with my arthritis.  ? ?Will forward to PCP. ?

## 2022-03-04 ENCOUNTER — Ambulatory Visit: Payer: Medicare Other | Admitting: Internal Medicine

## 2022-03-11 ENCOUNTER — Encounter: Payer: Self-pay | Admitting: Family Medicine

## 2022-03-11 ENCOUNTER — Ambulatory Visit (INDEPENDENT_AMBULATORY_CARE_PROVIDER_SITE_OTHER): Payer: No Typology Code available for payment source | Admitting: Family Medicine

## 2022-03-11 VITALS — BP 132/74 | HR 60 | Ht 66.0 in | Wt 197.1 lb

## 2022-03-11 DIAGNOSIS — G8929 Other chronic pain: Secondary | ICD-10-CM | POA: Diagnosis not present

## 2022-03-11 DIAGNOSIS — J301 Allergic rhinitis due to pollen: Secondary | ICD-10-CM | POA: Diagnosis not present

## 2022-03-11 DIAGNOSIS — M85859 Other specified disorders of bone density and structure, unspecified thigh: Secondary | ICD-10-CM

## 2022-03-11 DIAGNOSIS — R109 Unspecified abdominal pain: Secondary | ICD-10-CM

## 2022-03-11 DIAGNOSIS — M25511 Pain in right shoulder: Secondary | ICD-10-CM

## 2022-03-11 DIAGNOSIS — R10A0 Flank pain, unspecified side: Secondary | ICD-10-CM

## 2022-03-11 LAB — POCT URINALYSIS DIP (MANUAL ENTRY)
Bilirubin, UA: NEGATIVE
Blood, UA: NEGATIVE
Glucose, UA: NEGATIVE mg/dL
Ketones, POC UA: NEGATIVE mg/dL
Leukocytes, UA: NEGATIVE
Nitrite, UA: NEGATIVE
Protein Ur, POC: NEGATIVE mg/dL
Spec Grav, UA: 1.015 (ref 1.010–1.025)
Urobilinogen, UA: 1 E.U./dL
pH, UA: 7 (ref 5.0–8.0)

## 2022-03-11 MED ORDER — KETOROLAC TROMETHAMINE 15 MG/ML IJ SOLN
15.0000 mg | Freq: Once | INTRAMUSCULAR | Status: AC
  Administered 2022-03-11: 15 mg via INTRAVENOUS

## 2022-03-11 MED ORDER — ALENDRONATE SODIUM 35 MG PO TABS: ORAL_TABLET | ORAL | 2 refills | Status: DC

## 2022-03-11 MED ORDER — TIZANIDINE HCL 4 MG PO TABS: 4.0000 mg | ORAL_TABLET | Freq: Four times a day (QID) | ORAL | 0 refills | Status: DC | PRN

## 2022-03-11 MED ORDER — MELOXICAM 15 MG PO TABS: 15.0000 mg | ORAL_TABLET | Freq: Every day | ORAL | 0 refills | Status: DC

## 2022-03-11 MED ORDER — CETIRIZINE HCL 10 MG PO TABS: 10.0000 mg | ORAL_TABLET | Freq: Every day | ORAL | 2 refills | Status: DC

## 2022-03-11 NOTE — Progress Notes (Addendum)
   SUBJECTIVE:   CHIEF COMPLAINT / HPI:   Chief Complaint  Patient presents with   Flank Pain     Andrea Buck is a 76 y.o. female here for right flank pain.  Patient has a history of right renal cyst.  Patient reports a tightening sensation in her right side and lower back denies 6 days.  No fever, dysuria, history of kidney stones.  She has been using CBD Gummies with some relief recently they have not been taking the edge off.  Taking Mobic without relief.   PERTINENT  PMH / PSH: reviewed and updated as appropriate   OBJECTIVE:   BP 132/74   Pulse 60   Ht '5\' 6"'$  (1.676 m)   Wt 197 lb 2 oz (89.4 kg)   SpO2 98%   BMI 31.82 kg/m    GEN: pleasant elderly well appearing female, in no acute distress  CV: regular rate  RESP: no increased work of breathing ABD: Bowel sounds present. Soft, non-tender, non-distended. No CVA tenderness.  MSK: no LE edema, right flank at leave of T12-L1 TTP, hypertonicity of musculature present, no T or L spine midline tenderness  SKIN: warm, dry, no rash on visible skin    ASSESSMENT/PLAN:   Seasonal allergic rhinitis due to pollen Stable.  Refill cetirizine at patient's request  Osteopenia of neck of femur Stable.  Refilled Fosamax at patient request for  Flank pain Reviewed ultrasound from August 2021 that showed a large right renal cyst that was unchanged from previous.  Offered repeat renal ultrasound but she declined.  Obtain CBC and BMP.  UA was unremarkable for for hematuria or pyuria.  Doubt kidney stone, pyelonephritis or cystitis.   I suspect her pain may be MSK in origin.  Provided Toradol 50 mg injection.  Start Zanaflex as needed and refill patient's Mobic (with PPI).  Consider CT if not improved in 4 to 6 weeks.      Lyndee Hensen, DO PGY-3, [facility] 03/13/2022

## 2022-03-11 NOTE — Patient Instructions (Signed)
It was great seeing you today! ? ?Visit Remembers: ?- Stop by the pharmacy to pick up your prescriptions  ?- Continue to work on your healthy eating habits and incorporating exercise into your daily life.  ?- Medicine Changes: Start Zanaflex (Tizanidine a muscle relaxer) at bedtime, this medication can make your drowsy  ?- To Do: Let me know if your pain does not improve  ? ?Regarding lab work today:  ?Due to recent changes in healthcare laws, you may see the results of your imaging and laboratory studies on MyChart before your provider has had a chance to review them.  I understand that in some cases there may be results that are confusing or concerning to you. Not all laboratory results come back in the same time frame and you may be waiting for multiple results in order to interpret others.  Please give [place] 72 hours in order for your provider to thoroughly review all the results before contacting the office for clarification of your results. If everything is normal, you will get a letter in the mail or a message in My Chart. Please give [place] a call if you do not hear from [place] after 2 weeks. ? ?Please bring all of your medications with you to each visit.  ? ? ?Feel free to call with any questions or concerns at any time, at [phone]. ?  ?Take care,  ?Dr. Susa Simmonds ?[place]  ?

## 2022-03-12 LAB — CBC
Hematocrit: 37.8 % (ref 34.0–46.6)
Hemoglobin: 12.6 g/dL (ref 11.1–15.9)
MCH: 30.6 pg (ref 26.6–33.0)
MCHC: 33.3 g/dL (ref 31.5–35.7)
MCV: 92 fL (ref 79–97)
Platelets: 188 10*3/uL (ref 150–450)
RBC: 4.12 x10E6/uL (ref 3.77–5.28)
RDW: 12.9 % (ref 11.7–15.4)
WBC: 3.7 10*3/uL (ref 3.4–10.8)

## 2022-03-12 LAB — BASIC METABOLIC PANEL
BUN/Creatinine Ratio: 17 (ref 12–28)
BUN: 13 mg/dL (ref 8–27)
CO2: 24 mmol/L (ref 20–29)
Calcium: 9.3 mg/dL (ref 8.7–10.3)
Chloride: 104 mmol/L (ref 96–106)
Creatinine, Ser: 0.77 mg/dL (ref 0.57–1.00)
Glucose: 101 mg/dL — ABNORMAL HIGH (ref 70–99)
Potassium: 4.4 mmol/L (ref 3.5–5.2)
Sodium: 140 mmol/L (ref 134–144)
eGFR: 80 mL/min/{1.73_m2} (ref >59)

## 2022-03-13 ENCOUNTER — Encounter: Payer: Self-pay | Admitting: Family Medicine

## 2022-03-13 NOTE — Assessment & Plan Note (Signed)
Stable.  Refilled Fosamax at patient request for

## 2022-03-13 NOTE — Assessment & Plan Note (Signed)
Reviewed ultrasound from August 2021 that showed a large right renal cyst that was unchanged from previous.  Offered repeat renal ultrasound but she declined.  Obtain CBC and BMP.  UA was unremarkable for for hematuria or pyuria.  Doubt kidney stone, pyelonephritis or cystitis.   I suspect her pain may be MSK in origin.  Provided Toradol 50 mg injection.  Start Zanaflex as needed and refill patient's Mobic (with PPI).  Consider CT if not improved in 4 to 6 weeks.

## 2022-03-13 NOTE — Assessment & Plan Note (Signed)
Stable.  Refill cetirizine at patient's request

## 2022-03-17 ENCOUNTER — Ambulatory Visit: Payer: Medicare Other | Admitting: Internal Medicine

## 2022-03-17 NOTE — Progress Notes (Deleted)
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 States she will occ cough up gray to green phlegm and also will have some wheezing.  Dyspnea:  MMRC3 = can't walk 100 yards even at a slow pace at a flat grade s stopping due to sob   Cough: green mucus assoc with sinus congestion esp in am  Sleep: flat/ 2 pillows SABA use: don't seem to help Rec Augmentin 875 mg take one pill twice daily  X 10 days - take at breakfast and supper with large glass of water.  It would help reduce the usual side effects (diarrhea and yeast infections) if you ate cultured yogurt at lunch.  Depomedrol 120 mg IM  For cough / congestion > mucinex dm 1200 mg every 12 hours as needed  Change Symbicort to 80 Take 2 puffs first thing in am and then another 2 puffs about 12 hours later.  Work on inhaler technique:  Only use your albuterol as a rescue medication Pantoprazole (protonix) 40 mg   Take  30-60 min before first meal of the day and Pepcid (famotidine)  20 mg after supper until return to office - this is the best way to tell whether stomach acid is contributing to your problem.   GERD diet reviewed, bed blocks rec    Please remember to go to the lab and x-ray department  for your tests - we will call you with the results when they are available.       09/04/21 Andrea Napoleon NP recs Recommendations: - Trial off Symbicort; use albuterol 2 puffs every 4-6 hours as needed  (if breathing symptoms worsen off Symbicort then resume) - Continue Atrovent  and/or flonase nasal spray - Continue mucinex '600mg'$  twice a day  - Continue reflux medication as directed  Referral: - Speech therapy re: vocal cord paralysis     03/17/2022  f/u ov/Andrea Buck re: ***   maint on ***  No chief complaint on file.   Dyspnea:  *** Cough: *** Sleeping: *** SABA use: *** 02: *** Covid status:   ***   No obvious day to day or daytime variability or assoc excess/ purulent sputum or mucus plugs or hemoptysis or cp or chest tightness, subjective wheeze or overt sinus or hb symptoms.   *** without nocturnal  or early am exacerbation  of respiratory  c/o's or need for noct saba. Also denies any obvious fluctuation of symptoms with weather or environmental changes or other aggravating or alleviating factors except as outlined above   No unusual exposure hx or h/o childhood pna/ asthma or knowledge of premature birth.  Current Allergies, Complete Past Medical History, Past Surgical History, Family History, and Social History were reviewed in [employer] record.  ROS  The following are not active complaints unless bolded Hoarseness, sore throat, dysphagia, dental problems, itching, sneezing,  nasal congestion or discharge of excess mucus or purulent secretions,  ear ache,   fever, chills, sweats, unintended wt loss or wt gain, classically pleuritic or exertional cp,  orthopnea pnd or arm/hand swelling  or leg swelling, presyncope, palpitations, abdominal pain, anorexia, nausea, vomiting, diarrhea  or change in bowel habits or change in bladder habits, change in stools or change in urine, dysuria, hematuria,  rash, arthralgias, visual complaints, headache, numbness, weakness or ataxia or problems with walking or coordination,  change in mood or  memory.        No outpatient medications have been marked as taking for the 03/17/22 encounter (Appointment) with Tanda Rockers, MD.         Past Medical History:  Diagnosis Date   Acute cystitis with  hematuria 07/22/2018   Anxiety and depression 09/06/2018   Blood transfusion    after hysterectomy   Bronchitis, chronic ([place]) 2005   Carpal tunnel syndrome 12/24/2006   Qualifier: Diagnosis of  By: Shelbie Proctor     CHOLELITHIASIS 04/25/2009   Qualifier: Diagnosis of  By: Genene Churn MD, Jessica     COPD (chronic obstructive pulmonary disease) ([place])    Depression    GASTROESOPHAGEAL REFLUX, NO ESOPHAGITIS 12/24/2006   Qualifier: Diagnosis of  By: Oneita Jolly TUMOR 2001   Qualifier: Diagnosis of  By: Genene Churn MD, Jessica     Hyperlipidemia    Hypertension    MENOPAUSAL SYNDROME 12/24/2006   Qualifier: Diagnosis of  By: Shelbie Proctor     NECK MASS 12/29/2006   Qualifier: Diagnosis of  By: Laurance Flatten CMA, [place]     Sciatica 02/10/2017   Screening for hypothyroidism 07/21/2017   Vocal cord paralysis 05/26/2011   RIGHT vocal cord parlaysis Seen by Dr,. Pricilla Riffle at [place].( May 03, 2011)  Placed on scopalamine patch and w f/u [facility]        Objective:     Wt Readings from Last 3 Encounters:  03/11/22 197 lb 2 oz (89.4 kg)  11/04/21 200 lb 3.2 oz (90.8 kg)  09/04/21 200 lb 9.6 oz (91 kg)      Vital signs reviewed  03/17/2022  - Note at rest 02 sats  ***% on ***   General appearance:    ***              Assessment

## 2022-04-01 ENCOUNTER — Encounter: Payer: Self-pay | Admitting: *Deleted

## 2022-04-16 ENCOUNTER — Other Ambulatory Visit: Payer: Self-pay | Admitting: Internal Medicine

## 2022-04-16 DIAGNOSIS — J449 Chronic obstructive pulmonary disease, unspecified: Secondary | ICD-10-CM

## 2022-05-12 DIAGNOSIS — J3801 Paralysis of vocal cords and larynx, unilateral: Secondary | ICD-10-CM | POA: Diagnosis not present

## 2022-05-12 DIAGNOSIS — R1312 Dysphagia, oropharyngeal phase: Secondary | ICD-10-CM | POA: Diagnosis not present

## 2022-05-12 DIAGNOSIS — R490 Dysphonia: Secondary | ICD-10-CM | POA: Diagnosis not present

## 2022-06-05 ENCOUNTER — Encounter: Payer: Self-pay | Admitting: Student

## 2022-06-05 ENCOUNTER — Ambulatory Visit (INDEPENDENT_AMBULATORY_CARE_PROVIDER_SITE_OTHER): Payer: No Typology Code available for payment source | Admitting: Student

## 2022-06-05 VITALS — BP 135/68 | HR 73 | Ht 66.0 in | Wt 192.2 lb

## 2022-06-05 DIAGNOSIS — I10 Essential (primary) hypertension: Secondary | ICD-10-CM | POA: Diagnosis not present

## 2022-06-05 DIAGNOSIS — Z0000 Encounter for general adult medical examination without abnormal findings: Secondary | ICD-10-CM

## 2022-06-05 DIAGNOSIS — L989 Disorder of the skin and subcutaneous tissue, unspecified: Secondary | ICD-10-CM | POA: Diagnosis not present

## 2022-06-05 MED ORDER — SHINGRIX 50 MCG/0.5ML IM SUSR: 0.5000 mL | Freq: Once | INTRAMUSCULAR | 0 refills | Status: AC

## 2022-06-05 MED ORDER — DOXYCYCLINE HYCLATE 100 MG PO TABS: 100.0000 mg | ORAL_TABLET | Freq: Two times a day (BID) | ORAL | 0 refills | Status: AC

## 2022-06-05 NOTE — Assessment & Plan Note (Addendum)
DDx includes sebaceous cyst, concern for keratoacanthoma although appearance makes this less likely, possibly infected/reactive lymph node although less likely given exam. No systemic symptoms per patient  Will treat for possible bacterial infection with doxycycline x5 days and warm compresses. Monitor lesion and return in 2 weeks. If persists in 2 weeks, will need CT neck. Strict return precautions, keep clean and dry

## 2022-06-05 NOTE — Patient Instructions (Addendum)
It was great to see you today! Thank you for choosing [facility] for your primary care. Ramond Marrow was seen for follow up.  Today we addressed: Schedule your colonoscopy to help detect colon cancer. The stomach doctors will call to schedule an appt with you. If you decide against this, please ask about the cologuard as an option.   You can get the Shingrix vaccine at the pharmacy. You will need the 2nd shot 2-6 months after the first. This vaccine is important to help prevent shingles.   Continue with doxycycline 100 mg twice daily WITH FOOD  Continue with warm compresses intermittently throughout the day and keep clean and dry otherwise We will see you in 2 weeks to see if the symptoms improve, if not we will likely get imaging.   If you haven't already, sign up for My Chart to have easy access to your labs results, and communication with your primary care physician.  You should return to our clinic Return in about 2 weeks (around 06/19/2022) for neck lesion f/u .  I recommend that you always bring your medications to each appointment as this makes it easy to ensure you are on the correct medications and helps [place] not miss refills when you need them.  Please arrive 15 minutes before your appointment to ensure smooth check in process.  We appreciate your efforts in making this happen.  Please call the clinic at [phone] if your symptoms worsen or you have any concerns.  Thank you for allowing me to participate in your care, Erskine Emery, MD 06/05/2022, 2:38 PM PGY-2, [place]

## 2022-06-05 NOTE — Assessment & Plan Note (Signed)
Sent referral to GI as last referral was several years ago.  Sent Shingrix second dose to pharmacy.  Patient understands.

## 2022-06-05 NOTE — Progress Notes (Signed)
  SUBJECTIVE:   CHIEF COMPLAINT / HPI:   Neck Lesion:  Present for a few months initially as a "small, black" lesion  No trauma to cause the onset  Change in color: Yes, on Monday, changed to pink, painful, and swollen with black area on top  Then she drained the lesion as it was painful for her, was able to get the black spot off of the area in addition to what appeared to be bloody, purulent fluid She stopped draining the lesion as it was painful and Neosporin as well as a Band-Aid on the area Systemic symptoms: none Removal previously: No previous lesions similar to this  Drainage bloody and purulent, no smell  Area still somewhat painful today but improved since Monday.   Needs Shingrix dose #2 and colonoscopy for healthcare maintenance   PERTINENT  PMH / PSH:   Vit D def, sleep disorder, MDD, HLD, estrogen def, osteoporosis, COPD, vocal cord paralysis   OBJECTIVE:  BP 135/68   Pulse 73   Ht $R'5\' 6"'mj$  (1.676 m)   Wt 192 lb 3.2 oz (87.2 kg)   SpO2 100%   BMI 31.02 kg/m   General: NAD, pleasant, able to participate in exam Neck: Small indurated slightly tender lesion, no warmth or drainage. See photo below   Cardiac: RRR, no murmurs auscultated Extremities: warm and well perfused, no edema or cyanosis Skin: warm and dry, no rashes noted Neuro: alert, no obvious focal deficits, speech normal Psych: Normal affect and mood  ASSESSMENT/PLAN:  HYPERTENSION, BENIGN SYSTEMIC BP: 135/68 today. Well controlled. Goal met. Continue to work on healthy dietary habits and exercise.  Continue with regimen of HCTZ and losartan.  Up-to-date on BMP.   Healthcare maintenance Sent referral to GI as last referral was several years ago.  Sent Shingrix second dose to pharmacy.  Patient understands.  Lesion of neck DDx includes sebaceous cyst, concern for keratoacanthoma although appearance makes this less likely, possibly infected/reactive lymph node although less likely given exam. No  systemic symptoms per patient  Will treat for possible bacterial infection with doxycycline x5 days and warm compresses. Monitor lesion and return in 2 weeks. If persists in 2 weeks, will need CT neck. Strict return precautions, keep clean and dry    Orders Placed This Encounter  Procedures   Ambulatory referral to Gastroenterology    Referral Priority:   Routine    Referral Type:   Consultation    Referral Reason:   Specialty Services Required    Number of Visits Requested:   1   Meds ordered this encounter  Medications   Zoster Vaccine Adjuvanted [facility]) injection    Sig: Inject 0.5 mLs into the muscle once for 1 dose.    Dispense:  0.5 mL    Refill:  0   doxycycline (VIBRA-TABS) 100 MG tablet    Sig: Take 1 tablet (100 mg total) by mouth 2 (two) times daily for 5 days.    Dispense:  10 tablet    Refill:  0   Return in about 2 weeks (around 06/19/2022) for neck lesion f/u . Erskine Emery, MD 06/05/2022, 2:55 PM PGY-2, [place]

## 2022-06-05 NOTE — Assessment & Plan Note (Addendum)
BP: 135/68 today. Well controlled. Goal met. Continue to work on healthy dietary habits and exercise.  Continue with regimen of HCTZ and losartan.  Up-to-date on BMP.  

## 2022-06-09 ENCOUNTER — Other Ambulatory Visit: Payer: Self-pay | Admitting: Student

## 2022-06-09 DIAGNOSIS — Z1211 Encounter for screening for malignant neoplasm of colon: Secondary | ICD-10-CM

## 2022-06-29 ENCOUNTER — Other Ambulatory Visit: Payer: Self-pay | Admitting: Family Medicine

## 2022-06-29 ENCOUNTER — Other Ambulatory Visit: Payer: Self-pay | Admitting: Internal Medicine

## 2022-06-29 DIAGNOSIS — J449 Chronic obstructive pulmonary disease, unspecified: Secondary | ICD-10-CM

## 2022-06-29 DIAGNOSIS — M85859 Other specified disorders of bone density and structure, unspecified thigh: Secondary | ICD-10-CM

## 2022-07-01 ENCOUNTER — Ambulatory Visit (INDEPENDENT_AMBULATORY_CARE_PROVIDER_SITE_OTHER): Payer: No Typology Code available for payment source | Admitting: Family Medicine

## 2022-07-01 ENCOUNTER — Encounter: Payer: Self-pay | Admitting: Family Medicine

## 2022-07-01 DIAGNOSIS — L989 Disorder of the skin and subcutaneous tissue, unspecified: Secondary | ICD-10-CM

## 2022-07-01 NOTE — Patient Instructions (Signed)
It was great seeing you today!  Today we discussed your neck lesion, it has almost completely resolved. If you notice any changes developing including fever, chills or a new growth then please schedule another appointment and we will consider imaging at that time.   Please follow up at your next scheduled appointment, if anything arises between now and then, please don't hesitate to contact our office.   Thank you for allowing [place] to be a part of your medical care!  Thank you, Dr. Larae Grooms

## 2022-07-01 NOTE — Progress Notes (Signed)
    SUBJECTIVE:   CHIEF COMPLAINT / HPI:   Patient presents for follow up of a neck lesion. She was placed on doxycycline and completed course of antibiotic. Denies fever, chills, drainage, pus, bleeding or color changes. She says that it has improved significantly since her last visit. The size has gotten smaller.   OBJECTIVE:   BP 126/72   Pulse 78   Wt 194 lb (88 kg)   SpO2 97%   BMI 31.31 kg/m   General: Patient well-appearing, in no acute distress. Neck: no lesion noted, healed without drainage or skin changes, no tenderness along palpation of neck, non-tender thyroid    ASSESSMENT/PLAN:   Lesion of neck -essentially resolved, no signs of lesions or evidence of infectious etiology -strict return precautions discussed, instructed patient to return if she develops fever, chills, skin changes or recurrence of lesion. Will consider CT neck at that time if appropriate     Donney Dice, [place]

## 2022-07-01 NOTE — Assessment & Plan Note (Signed)
-  essentially resolved, no signs of lesions or evidence of infectious etiology -strict return precautions discussed, instructed patient to return if she develops fever, chills, skin changes or recurrence of lesion. Will consider CT neck at that time if appropriate

## 2022-07-07 ENCOUNTER — Other Ambulatory Visit: Payer: Self-pay | Admitting: Internal Medicine

## 2022-07-07 DIAGNOSIS — J449 Chronic obstructive pulmonary disease, unspecified: Secondary | ICD-10-CM

## 2022-07-14 DIAGNOSIS — J3801 Paralysis of vocal cords and larynx, unilateral: Secondary | ICD-10-CM | POA: Diagnosis not present

## 2022-07-15 DIAGNOSIS — Z760 Encounter for issue of repeat prescription: Secondary | ICD-10-CM | POA: Diagnosis not present

## 2022-07-15 DIAGNOSIS — Z008 Encounter for other general examination: Secondary | ICD-10-CM | POA: Diagnosis not present

## 2022-07-21 ENCOUNTER — Other Ambulatory Visit: Payer: Self-pay

## 2022-07-21 DIAGNOSIS — I10 Essential (primary) hypertension: Secondary | ICD-10-CM

## 2022-07-22 MED ORDER — LOSARTAN POTASSIUM 25 MG PO TABS: 25.0000 mg | ORAL_TABLET | Freq: Every day | ORAL | 3 refills | Status: DC

## 2022-07-24 ENCOUNTER — Telehealth: Payer: Self-pay

## 2022-07-24 NOTE — Telephone Encounter (Signed)
Patient calls nurse line requesting DME order for hospital bed.  Called patient's insurance company [employer]) regarding request. [employer] has dedicated partner for DME orders. Order needs to be sent to [place] with patient demographics.   Patient also needs clinical notes speaking to the need of equipment. Patient scheduled for next Thursday, 10/5 with Dr. Ruben Im. PCP did not have availability until the end of October.  Once visit is completed, notes and demographics will need to be faxed to [facility] at [phone].  Please send message to RN team once office note has been completed and order has been placed.    Talbot Grumbling, RN

## 2022-07-25 ENCOUNTER — Other Ambulatory Visit: Payer: Self-pay | Admitting: Student

## 2022-07-25 DIAGNOSIS — M85859 Other specified disorders of bone density and structure, unspecified thigh: Secondary | ICD-10-CM

## 2022-07-29 DIAGNOSIS — E669 Obesity, unspecified: Secondary | ICD-10-CM | POA: Diagnosis not present

## 2022-07-29 DIAGNOSIS — R2681 Unsteadiness on feet: Secondary | ICD-10-CM | POA: Diagnosis not present

## 2022-07-29 DIAGNOSIS — F320 Major depressive disorder, single episode, mild: Secondary | ICD-10-CM | POA: Diagnosis not present

## 2022-07-29 DIAGNOSIS — E213 Hyperparathyroidism, unspecified: Secondary | ICD-10-CM | POA: Diagnosis not present

## 2022-07-29 DIAGNOSIS — I700 Atherosclerosis of aorta: Secondary | ICD-10-CM | POA: Diagnosis not present

## 2022-07-29 DIAGNOSIS — G4700 Insomnia, unspecified: Secondary | ICD-10-CM | POA: Diagnosis not present

## 2022-07-29 DIAGNOSIS — Z6832 Body mass index (BMI) 32.0-32.9, adult: Secondary | ICD-10-CM | POA: Diagnosis not present

## 2022-07-29 DIAGNOSIS — M810 Age-related osteoporosis without current pathological fracture: Secondary | ICD-10-CM | POA: Diagnosis not present

## 2022-07-29 DIAGNOSIS — I10 Essential (primary) hypertension: Secondary | ICD-10-CM | POA: Diagnosis not present

## 2022-07-29 DIAGNOSIS — Z008 Encounter for other general examination: Secondary | ICD-10-CM | POA: Diagnosis not present

## 2022-07-29 DIAGNOSIS — K219 Gastro-esophageal reflux disease without esophagitis: Secondary | ICD-10-CM | POA: Diagnosis not present

## 2022-07-29 DIAGNOSIS — E785 Hyperlipidemia, unspecified: Secondary | ICD-10-CM | POA: Diagnosis not present

## 2022-07-30 ENCOUNTER — Telehealth: Payer: Self-pay

## 2022-07-30 NOTE — Telephone Encounter (Signed)
Received fax from [place] with patients medications  and assessment summary. Placed in Dr. McDiarmid box.Salvatore Marvel, CMA

## 2022-07-30 NOTE — Progress Notes (Deleted)
    SUBJECTIVE:   CHIEF COMPLAINT / HPI:   DME -   PERTINENT  PMH / PSH: ***  OBJECTIVE:   There were no vitals taken for this visit.  ***  ASSESSMENT/PLAN:   No problem-specific Assessment & Plan notes found for this encounter.     Salvadore Oxford, MD [place]

## 2022-07-31 ENCOUNTER — Ambulatory Visit: Payer: No Typology Code available for payment source | Admitting: Family Medicine

## 2022-07-31 NOTE — Assessment & Plan Note (Deleted)
Tdap? COVID, Colonoscopy?

## 2022-08-12 ENCOUNTER — Encounter: Payer: Self-pay | Admitting: Family Medicine

## 2022-08-12 ENCOUNTER — Other Ambulatory Visit: Payer: No Typology Code available for payment source

## 2022-08-12 ENCOUNTER — Other Ambulatory Visit: Payer: Self-pay

## 2022-08-12 ENCOUNTER — Ambulatory Visit (INDEPENDENT_AMBULATORY_CARE_PROVIDER_SITE_OTHER): Payer: No Typology Code available for payment source | Admitting: Family Medicine

## 2022-08-12 VITALS — BP 166/85 | HR 71 | Ht 66.0 in | Wt 192.0 lb

## 2022-08-12 DIAGNOSIS — E559 Vitamin D deficiency, unspecified: Secondary | ICD-10-CM

## 2022-08-12 DIAGNOSIS — J4489 Other specified chronic obstructive pulmonary disease: Secondary | ICD-10-CM | POA: Diagnosis not present

## 2022-08-12 DIAGNOSIS — E785 Hyperlipidemia, unspecified: Secondary | ICD-10-CM | POA: Diagnosis not present

## 2022-08-12 DIAGNOSIS — I10 Essential (primary) hypertension: Secondary | ICD-10-CM | POA: Diagnosis not present

## 2022-08-12 DIAGNOSIS — K219 Gastro-esophageal reflux disease without esophagitis: Secondary | ICD-10-CM

## 2022-08-12 DIAGNOSIS — J3800 Paralysis of vocal cords and larynx, unspecified: Secondary | ICD-10-CM

## 2022-08-12 DIAGNOSIS — F331 Major depressive disorder, recurrent, moderate: Secondary | ICD-10-CM

## 2022-08-12 MED ORDER — ERGOCALCIFEROL 50 MCG (2000 UT) PO TABS: 1.0000 | ORAL_TABLET | Freq: Every day | ORAL | 0 refills | Status: DC

## 2022-08-12 MED ORDER — ROSUVASTATIN CALCIUM 20 MG PO TABS: 20.0000 mg | ORAL_TABLET | Freq: Every day | ORAL | 0 refills | Status: DC

## 2022-08-12 MED ORDER — SERTRALINE HCL 100 MG PO TABS: 150.0000 mg | ORAL_TABLET | Freq: Every day | ORAL | 0 refills | Status: DC

## 2022-08-12 MED ORDER — FAMOTIDINE 20 MG PO TABS: ORAL_TABLET | ORAL | 0 refills | Status: DC

## 2022-08-12 MED ORDER — PANTOPRAZOLE SODIUM 40 MG PO TBEC: DELAYED_RELEASE_TABLET | ORAL | 0 refills | Status: DC

## 2022-08-12 NOTE — Assessment & Plan Note (Signed)
Uncontrolled today, previously controlled in recent office visits.  No changes today, advised to check her BP at home and follow-up in 1 month.

## 2022-08-12 NOTE — Assessment & Plan Note (Signed)
Sertraline refilled.  

## 2022-08-12 NOTE — Assessment & Plan Note (Signed)
Refilled vitamin D supplement.  Check vitamin D level today

## 2022-08-12 NOTE — Progress Notes (Signed)
    SUBJECTIVE:   CHIEF COMPLAINT / HPI:  Chief Complaint  Patient presents with   Follow-up    Need a hospital bed    Patient is here discussed need for possible bed.  She reports she spoke with her insurance company and felt that she could qualify for a hospital bed due to her GERD and back pain.  She feels she could benefit from a hospital bed so that she can elevate the head of bed to help with her reflux symptoms and help her sleep at night.  She has tried wedge pillows but found uncomfortable.  Acid reflux symptoms have worsened since she ran out of famotidine and pantoprazole and request refill today.  She ran out of sertraline 1 month ago and needs a refill.  States that she was doing well when she was on the medication.  Also needs refill of her statin medication and vitamin D supplement.  PERTINENT  PMH / PSH: GERD, obesity, depression, HTN, HLD, asthmatic bronchitis  Patient Care Team: Erskine Emery, MD as PCP - General (Family Medicine) Ernestine Conrad, MD (Otolaryngology) Tanda Rockers, MD as Consulting Physician (Pulmonary Disease) Drake Leach, OD (Optometry)   OBJECTIVE:   BP (!) 166/85   Pulse 71   Ht '5\' 6"'$  (1.676 m)   Wt 192 lb (87.1 kg)   SpO2 100%   BMI 30.99 kg/m   Physical Exam Constitutional:      General: She is not in acute distress.    Appearance: She is obese.  HENT:     Head: Normocephalic and atraumatic.  Cardiovascular:     Rate and Rhythm: Normal rate and regular rhythm.  Pulmonary:     Effort: Pulmonary effort is normal. No respiratory distress.     Breath sounds: Normal breath sounds.  Musculoskeletal:     Cervical back: Neck supple.  Neurological:     Mental Status: She is alert.         08/12/2022    2:28 PM  Depression screen PHQ 2/9  Decreased Interest 1  Down, Depressed, Hopeless 1  PHQ - 2 Score 2  Altered sleeping 2  Tired, decreased energy 2  Change in appetite 2  Feeling bad or failure about yourself  1   Trouble concentrating 1  Moving slowly or fidgety/restless 1  Suicidal thoughts 0  PHQ-9 Score 11  Difficult doing work/chores Not difficult at all     {Show previous vital signs (optional):23777}    ASSESSMENT/PLAN:   GASTROESOPHAGEAL REFLUX, NO ESOPHAGITIS Pantoprazole and famotidine refilled.  Unable to tolerate wedge pillows, may benefit from hospital bed that can elevate head of bed.  I told her that I am not certain that insurance will cover hospital bed for this indication but will try. - DME hospital bed  HYPERTENSION, BENIGN SYSTEMIC Uncontrolled today, previously controlled in recent office visits.  No changes today, advised to check her BP at home and follow-up in 1 month.  Vitamin D deficiency Refilled vitamin D supplement.  Check vitamin D level today  Dyslipidemia Refilled statin, check lipid panel  Major depressive disorder, recurrent episode (HCC) Sertraline refilled    Return in about 4 weeks (around 09/09/2022) for f/u HTN.   Zola Button, MD [place]

## 2022-08-12 NOTE — Patient Instructions (Addendum)
It was nice seeing you today!  Unfortunately, you don't qualify for a hospital bed. I would recommend trying a wedge pillow for your reflux.  Medications have been refilled.  Recommend follow-up with your PCP in 1 month to monitor your blood pressure. Try to check your blood pressure a few times a week and write the numbers down.  Stay well, Zola Button, MD [place] [phone]  --  Make sure to check out at the front desk before you leave today.  Please arrive at least 15 minutes prior to your scheduled appointments.  If you had blood work today, I will send you a MyChart message or a letter if results are normal. Otherwise, I will give you a call.  If you had a referral placed, they will call you to set up an appointment. Please give [place] a call if you don't hear back in the next 2 weeks.  If you need additional refills before your next appointment, please call your pharmacy first.

## 2022-08-12 NOTE — Assessment & Plan Note (Addendum)
Pantoprazole and famotidine refilled.  Unable to tolerate wedge pillows, may benefit from hospital bed that can elevate head of bed.  I told her that I am not certain that insurance will cover hospital bed for this indication but will try. - DME hospital bed

## 2022-08-12 NOTE — Assessment & Plan Note (Signed)
Refilled statin, check lipid panel

## 2022-08-13 LAB — LIPID PANEL
Chol/HDL Ratio: 3.7 ratio (ref 0.0–4.4)
Cholesterol, Total: 212 mg/dL — ABNORMAL HIGH (ref 100–199)
HDL: 58 mg/dL (ref >39)
LDL Chol Calc (NIH): 140 mg/dL — ABNORMAL HIGH (ref 0–99)
Triglycerides: 77 mg/dL (ref 0–149)
VLDL Cholesterol Cal: 14 mg/dL (ref 5–40)

## 2022-08-13 LAB — VITAMIN D 25 HYDROXY (VIT D DEFICIENCY, FRACTURES): Vit D, 25-Hydroxy: 13.8 ng/mL — ABNORMAL LOW (ref 30.0–100.0)

## 2022-08-14 ENCOUNTER — Telehealth: Payer: Self-pay | Admitting: Family Medicine

## 2022-08-14 MED ORDER — VITAMIN D (ERGOCALCIFEROL) 1.25 MG (50000 UNIT) PO CAPS: 50000.0000 [IU] | ORAL_CAPSULE | ORAL | 0 refills | Status: DC

## 2022-08-14 NOTE — Telephone Encounter (Signed)
Called patient to discuss lab results.  Vitamin D levels are still very low.  She reports that she has been out of her daily vitamin D supplement for about 1 month. Advised patient to take high-dose vitamin D 50,000 units weekly and have vitamin D levels rechecked in about 6 to 8 weeks.  She will stop her daily vitamin D supplement for now.  Also reminded patient to follow-up in 1 month for blood pressure recheck.

## 2022-08-25 ENCOUNTER — Emergency Department (HOSPITAL_COMMUNITY): Payer: No Typology Code available for payment source

## 2022-08-25 ENCOUNTER — Emergency Department (HOSPITAL_COMMUNITY)
Admission: EM | Admit: 2022-08-25 | Discharge: 2022-08-25 | Discharge status: Against Medical Advice | Payer: No Typology Code available for payment source | Attending: Emergency Medicine | Admitting: Emergency Medicine

## 2022-08-25 ENCOUNTER — Encounter (HOSPITAL_COMMUNITY): Payer: Self-pay

## 2022-08-25 DIAGNOSIS — R42 Dizziness and giddiness: Secondary | ICD-10-CM | POA: Diagnosis not present

## 2022-08-25 DIAGNOSIS — Z5321 Procedure and treatment not carried out due to patient leaving prior to being seen by health care provider: Secondary | ICD-10-CM | POA: Diagnosis not present

## 2022-08-25 DIAGNOSIS — R55 Syncope and collapse: Secondary | ICD-10-CM | POA: Diagnosis not present

## 2022-08-25 DIAGNOSIS — I959 Hypotension, unspecified: Secondary | ICD-10-CM | POA: Diagnosis not present

## 2022-08-25 DIAGNOSIS — R0902 Hypoxemia: Secondary | ICD-10-CM | POA: Diagnosis not present

## 2022-08-25 LAB — CBC
HCT: 35.3 % — ABNORMAL LOW (ref 36.0–46.0)
Hemoglobin: 11.3 g/dL — ABNORMAL LOW (ref 12.0–15.0)
MCH: 31 pg (ref 26.0–34.0)
MCHC: 32 g/dL (ref 30.0–36.0)
MCV: 96.7 fL (ref 80.0–100.0)
Platelets: 155 10*3/uL (ref 150–400)
RBC: 3.65 MIL/uL — ABNORMAL LOW (ref 3.87–5.11)
RDW: 13.7 % (ref 11.5–15.5)
WBC: 6.4 10*3/uL (ref 4.0–10.5)
nRBC: 0 % (ref 0.0–0.2)

## 2022-08-25 LAB — CBG MONITORING, ED: Glucose-Capillary: 160 mg/dL — ABNORMAL HIGH (ref 70–99)

## 2022-08-25 NOTE — ED Triage Notes (Addendum)
Pt arrived via EMS, witnsessed syncopal event by family, family helped lower pt to ground, no head strike. Pt c/o dizziness.

## 2022-08-25 NOTE — ED Provider Triage Note (Signed)
Emergency Medicine Provider Triage Evaluation Note  Andrea Buck , a 76 y.o. female  was evaluated in triage.  Pt complains of syncopal episode, witnessed by family just prior to ER arrival. Family helped lower the patient to the ground. Patient was complaining of dizziness before and after the event.   Review of Systems  Positive: Dizziness, syncope Negative: CP, SOB  Physical Exam  There were no vitals taken for this visit. Gen:   Awake, no distress   Resp:  Normal effort  MSK:   Moves extremities without difficulty  Other:    Medical Decision Making  Medically screening exam initiated at 3:00 PM.  Appropriate orders placed.  Andrea Buck was informed that the remainder of the evaluation will be completed by another provider, this initial triage assessment does not replace that evaluation, and the importance of remaining in the ED until their evaluation is complete.  Workup initiated   Juan Olthoff T, PA-C 08/25/22 1501

## 2022-08-27 ENCOUNTER — Telehealth: Payer: Self-pay

## 2022-08-27 NOTE — Telephone Encounter (Signed)
Confirmation received.   The patient will be self pay as her insurance does not cover DME.

## 2022-08-27 NOTE — Telephone Encounter (Signed)
Community message sent to [employer] for hospital bed.

## 2022-09-02 ENCOUNTER — Other Ambulatory Visit: Payer: Self-pay | Admitting: Student

## 2022-09-02 DIAGNOSIS — Z1231 Encounter for screening mammogram for malignant neoplasm of breast: Secondary | ICD-10-CM

## 2022-10-08 ENCOUNTER — Other Ambulatory Visit: Payer: Self-pay | Admitting: Family Medicine

## 2022-10-08 DIAGNOSIS — J4489 Other specified chronic obstructive pulmonary disease: Secondary | ICD-10-CM

## 2022-10-08 DIAGNOSIS — F331 Major depressive disorder, recurrent, moderate: Secondary | ICD-10-CM

## 2022-10-08 MED ORDER — VITAMIN D (ERGOCALCIFEROL) 1.25 MG (50000 UNIT) PO CAPS: 50000.0000 [IU] | ORAL_CAPSULE | ORAL | 0 refills | Status: DC

## 2022-10-14 ENCOUNTER — Encounter: Payer: Self-pay | Admitting: Student

## 2022-10-14 ENCOUNTER — Ambulatory Visit (INDEPENDENT_AMBULATORY_CARE_PROVIDER_SITE_OTHER): Payer: No Typology Code available for payment source | Admitting: Student

## 2022-10-14 VITALS — BP 134/70 | HR 69 | Wt 192.2 lb

## 2022-10-14 DIAGNOSIS — R0781 Pleurodynia: Secondary | ICD-10-CM | POA: Diagnosis not present

## 2022-10-14 DIAGNOSIS — R55 Syncope and collapse: Secondary | ICD-10-CM | POA: Diagnosis not present

## 2022-10-14 DIAGNOSIS — R109 Unspecified abdominal pain: Secondary | ICD-10-CM

## 2022-10-14 DIAGNOSIS — I10 Essential (primary) hypertension: Secondary | ICD-10-CM

## 2022-10-14 LAB — POCT URINALYSIS DIP (MANUAL ENTRY)
Blood, UA: NEGATIVE
Glucose, UA: NEGATIVE mg/dL
Ketones, POC UA: NEGATIVE mg/dL
Leukocytes, UA: NEGATIVE
Nitrite, UA: NEGATIVE
Spec Grav, UA: 1.025 (ref 1.010–1.025)
Urobilinogen, UA: 0.2 E.U./dL
pH, UA: 5.5 (ref 5.0–8.0)

## 2022-10-14 NOTE — Patient Instructions (Addendum)
It was great to see you today! Thank you for choosing [facility] for your primary care. Andrea Buck was seen for flank pain.  -We will continue with a chest x-ray to evaluate, you can go to [address] at [facility] imaging to get this done - We will order a renal ultrasound for you at [facility] - I will update you with the results of your urinalysis - If you experience another episode where you lose consciousness, passout, have confusion, feel your heart racing or having palpitations, please return  If you haven't already, sign up for My Chart to have easy access to your labs results, and communication with your primary care physician.  We are checking some labs today. If they are abnormal, I will call you. If they are normal, I will send you a MyChart message (if it is active) or a letter in the mail. If you do not hear about your labs in the next 2 weeks, please call the office. I recommend that you always bring your medications to each appointment as this makes it easy to ensure you are on the correct medications and helps [place] not miss refills when you need them. Call the clinic at [phone] if your symptoms worsen or you have any concerns.  You should return to our clinic Return in about 2 months (around 12/15/2022) for follow up. Please arrive 15 minutes before your appointment to ensure smooth check in process.  We appreciate your efforts in making this happen.  Thank you for allowing me to participate in your care, Erskine Emery, MD 10/14/2022, 3:47 PM PGY-2, [place]

## 2022-10-14 NOTE — Progress Notes (Signed)
SUBJECTIVE:   CHIEF COMPLAINT / HPI:   She has been having recurrent flank pain bilaterally. Flank pain on R>L over last couple of days. Improved today. Hurts worse when on her feet for long periods of time. Does not seem to hurt when twisting her body. The pain is defined as intermittently sharp, makes her catch her breath. There is an underlying dull pain.   She has been coughing and spitting up thick mucous. This has been going on for about two weeks. UTD on vaccines except RSV. No dyspnea.   Fainted recently 08/25/22. Daughter was helping her from the car, and then she had an episode of syncope after standing. Never had an episode of syncope before. Did not hit her head and thinks this could be related to dehydration, new medication to improve secretions. Went to ED, received EKG, but then left due to the wait.   PERTINENT  PMH / PSH: H/o cystitis with hematuria, vocal cord paralysis, dyslipidemia, vitamin D deficiency, allergic rhinitis, osteopenia, anxiety, asthmatic bronchitis, estrogen deficiency, GERD, h/o horner's syndrome    OBJECTIVE:  BP 134/70   Pulse 69   Wt 192 lb 4 oz (87.2 kg)   SpO2 99%   BMI 31.03 kg/m  Physical Exam Vitals reviewed.  Constitutional:      Appearance: Normal appearance.  HENT:     Right Ear: External ear normal.     Left Ear: External ear normal.     Nose: Nose normal.     Mouth/Throat:     Mouth: Mucous membranes are moist.  Eyes:     Conjunctiva/sclera: Conjunctivae normal.     Pupils: Pupils are equal, round, and reactive to light.  Cardiovascular:     Rate and Rhythm: Normal rate and regular rhythm.  Pulmonary:     Effort: Pulmonary effort is normal.     Comments: Focal diminishment at the bilateral bases with normal work of breathing, no wheeze or stridor.  Abdominal:     General: Abdomen is flat. Bowel sounds are normal.     Palpations: Abdomen is soft. There is no mass.     Tenderness: There is abdominal tenderness (mild bilateral  flank radiating to the back tenderness).  Musculoskeletal:     Cervical back: Normal range of motion.  Skin:    Capillary Refill: Capillary refill takes less than 2 seconds.  Neurological:     Mental Status: She is alert.  Psychiatric:        Mood and Affect: Mood normal.      ASSESSMENT/PLAN:  Flank pain Assessment & Plan: Broad differential with history of August 2021 that showed a large right renal cyst. UA dipstick overall unremarkable, culture sent. MSK is a possibility given history presented. Additionally, could be referred rib pain with bilateral diminishment on lung exam. Will further work up with CXR and renal ultrasound for differentiation of the source of the pain. Patient hemodynamically stable in office. She agrees to plan.   Orders: -     POCT urinalysis dipstick -     Urine Culture -     DG Chest 2 View; Future -     US RENAL; Future -     POCT urinalysis dipstick  Rib pain -     DG Chest 2 View; Future -     POCT urinalysis dipstick  HYPERTENSION, BENIGN SYSTEMIC Assessment & Plan: Stable    Vasovagal syncope Assessment & Plan: Seems to be vagal event causing the episode of syncope. EKG unremarkable from  [facility] and episode occurred almost two months ago. No irregular rhythm or murmur on examination with no noted palpitations. No further episodes have occurred. Neurologically intact on examination. No history of seizure or stroke. Strict return precautions provided to the patient. Instructed to hydrate routinely and let me know if the event occurs again.     Return in about 2 months (around 12/15/2022) for follow up. Alfredo Martinez, MD 10/15/2022, 9:58 AM PGY-2, [facility]

## 2022-10-15 ENCOUNTER — Ambulatory Visit (HOSPITAL_COMMUNITY)
Admission: RE | Admit: 2022-10-15 | Discharge: 2022-10-15 | Disposition: A | Discharge status: Home / Self Care | Payer: No Typology Code available for payment source | Source: Ambulatory Visit | Attending: Family Medicine | Admitting: Family Medicine

## 2022-10-15 DIAGNOSIS — R109 Unspecified abdominal pain: Secondary | ICD-10-CM | POA: Insufficient documentation

## 2022-10-15 DIAGNOSIS — N281 Cyst of kidney, acquired: Secondary | ICD-10-CM | POA: Diagnosis not present

## 2022-10-15 DIAGNOSIS — R55 Syncope and collapse: Secondary | ICD-10-CM | POA: Insufficient documentation

## 2022-10-15 DIAGNOSIS — R0781 Pleurodynia: Secondary | ICD-10-CM | POA: Insufficient documentation

## 2022-10-15 NOTE — Assessment & Plan Note (Signed)
Stable

## 2022-10-15 NOTE — Assessment & Plan Note (Signed)
Broad differential with history of August 2021 that showed a large right renal cyst. UA dipstick overall unremarkable, culture sent. MSK is a possibility given history presented. Additionally, could be referred rib pain with bilateral diminishment on lung exam. Will further work up with CXR and renal ultrasound for differentiation of the source of the pain. Patient hemodynamically stable in office. She agrees to plan.

## 2022-10-15 NOTE — Assessment & Plan Note (Addendum)
Seems to be vagal event causing the episode of syncope. EKG unremarkable from [facility] and episode occurred almost two months ago. No irregular rhythm or murmur on examination with no noted palpitations. No further episodes have occurred. Neurologically intact on examination. No history of seizure or stroke. Strict return precautions provided to the patient. Instructed to hydrate routinely and let me know if the event occurs again.

## 2022-10-16 LAB — URINE CULTURE: Organism ID, Bacteria: NO GROWTH

## 2022-10-21 ENCOUNTER — Encounter: Payer: Self-pay | Admitting: Student

## 2022-10-29 DIAGNOSIS — J3801 Paralysis of vocal cords and larynx, unilateral: Secondary | ICD-10-CM | POA: Diagnosis not present

## 2022-11-03 ENCOUNTER — Ambulatory Visit
Admission: RE | Admit: 2022-11-03 | Discharge: 2022-11-03 | Disposition: A | Discharge status: Home / Self Care | Payer: No Typology Code available for payment source | Source: Ambulatory Visit | Attending: Family Medicine | Admitting: Family Medicine

## 2022-11-03 DIAGNOSIS — Z1231 Encounter for screening mammogram for malignant neoplasm of breast: Secondary | ICD-10-CM | POA: Diagnosis not present

## 2022-11-05 ENCOUNTER — Other Ambulatory Visit: Payer: Self-pay | Admitting: Student

## 2022-11-05 DIAGNOSIS — H26491 Other secondary cataract, right eye: Secondary | ICD-10-CM | POA: Diagnosis not present

## 2022-11-05 DIAGNOSIS — R928 Other abnormal and inconclusive findings on diagnostic imaging of breast: Secondary | ICD-10-CM

## 2022-11-05 DIAGNOSIS — H04201 Unspecified epiphora, right lacrimal gland: Secondary | ICD-10-CM | POA: Diagnosis not present

## 2022-11-05 DIAGNOSIS — Z961 Presence of intraocular lens: Secondary | ICD-10-CM | POA: Diagnosis not present

## 2022-11-19 ENCOUNTER — Other Ambulatory Visit: Payer: Self-pay | Admitting: Family Medicine

## 2022-11-19 ENCOUNTER — Other Ambulatory Visit: Payer: Self-pay | Admitting: Student

## 2022-11-19 DIAGNOSIS — J4489 Other specified chronic obstructive pulmonary disease: Secondary | ICD-10-CM

## 2022-11-19 DIAGNOSIS — G8929 Other chronic pain: Secondary | ICD-10-CM

## 2022-11-19 DIAGNOSIS — R109 Unspecified abdominal pain: Secondary | ICD-10-CM

## 2022-11-25 ENCOUNTER — Other Ambulatory Visit: Payer: Self-pay

## 2022-11-25 DIAGNOSIS — R109 Unspecified abdominal pain: Secondary | ICD-10-CM

## 2022-11-25 DIAGNOSIS — G8929 Other chronic pain: Secondary | ICD-10-CM

## 2022-11-25 DIAGNOSIS — J4489 Other specified chronic obstructive pulmonary disease: Secondary | ICD-10-CM

## 2022-11-25 MED ORDER — MELOXICAM 15 MG PO TABS: 15.0000 mg | ORAL_TABLET | Freq: Every day | ORAL | 0 refills | Status: DC

## 2022-11-25 MED ORDER — PANTOPRAZOLE SODIUM 40 MG PO TBEC: DELAYED_RELEASE_TABLET | ORAL | 0 refills | Status: DC

## 2022-11-25 NOTE — Telephone Encounter (Signed)
Patient calls nurse line requesting refills on pantoprazole and meloxicam.   This was previously denied on 11/20/22. Patient has upcoming appointment on 2/19.   Please advise.   Talbot Grumbling, RN

## 2022-12-15 ENCOUNTER — Encounter: Payer: Self-pay | Admitting: Student

## 2022-12-15 ENCOUNTER — Ambulatory Visit (INDEPENDENT_AMBULATORY_CARE_PROVIDER_SITE_OTHER): Payer: No Typology Code available for payment source | Admitting: Student

## 2022-12-15 VITALS — BP 183/89 | HR 72 | Ht 66.0 in | Wt 196.6 lb

## 2022-12-15 DIAGNOSIS — E785 Hyperlipidemia, unspecified: Secondary | ICD-10-CM

## 2022-12-15 DIAGNOSIS — I10 Essential (primary) hypertension: Secondary | ICD-10-CM

## 2022-12-15 DIAGNOSIS — R198 Other specified symptoms and signs involving the digestive system and abdomen: Secondary | ICD-10-CM

## 2022-12-15 DIAGNOSIS — F331 Major depressive disorder, recurrent, moderate: Secondary | ICD-10-CM

## 2022-12-15 DIAGNOSIS — R109 Unspecified abdominal pain: Secondary | ICD-10-CM | POA: Diagnosis not present

## 2022-12-15 DIAGNOSIS — E559 Vitamin D deficiency, unspecified: Secondary | ICD-10-CM

## 2022-12-15 MED ORDER — ROSUVASTATIN CALCIUM 20 MG PO TABS: 20.0000 mg | ORAL_TABLET | Freq: Every day | ORAL | 0 refills | Status: DC

## 2022-12-15 MED ORDER — LOSARTAN POTASSIUM 50 MG PO TABS: 50.0000 mg | ORAL_TABLET | Freq: Every day | ORAL | 3 refills | Status: DC

## 2022-12-15 MED ORDER — LOSARTAN POTASSIUM 25 MG PO TABS: 25.0000 mg | ORAL_TABLET | Freq: Every day | ORAL | Status: DC

## 2022-12-15 NOTE — Patient Instructions (Addendum)
It was great to see you today! Thank you for choosing [facility] for your primary care. Andrea Buck was seen for follow up.  Today we addressed: Schedule your colonoscopy to help detect colon cancer. The stomach doctors will call to schedule an appt with you. If you decide against this, please ask about the cologuard as an option.   See information below to contact for colonoscopy scheduling Address: [address], [place], [place] [address] Phone: [phone]  I will check your labs today Please Continue with the Crestor 20 mg daily  Increase losartan to 50 mg daily   Blood Pressure Record Sheet To take your blood pressure, you will need a blood pressure machine. You can buy a blood pressure machine (blood pressure monitor) at your clinic, drug store, or online. When choosing one, consider: An automatic monitor that has an arm cuff. A cuff that wraps snugly around your upper arm. You should be able to fit only one finger between your arm and the cuff. A device that stores blood pressure reading results. Do not choose a monitor that measures your blood pressure from your wrist or finger. Follow your health care provider's instructions for how to take your blood pressure. To use this form: Take your blood pressure medications every day These measurements should be taken when you have been at rest for at least 10-15 min Take at least 2 readings with each blood pressure check. This makes sure the results are correct. Wait 1-2 minutes between measurements. Write down the results in the spaces on this form. Keep in mind it should always be recorded systolic over diastolic. Both numbers are important.  Repeat this every day for 2-3 weeks, or as told by your health care provider.  Make a follow-up appointment with your health care provider to discuss the results.  Blood Pressure Log Date Medications taken? (Y/N) Blood Pressure Time of Day                                                                                                         If you haven't already, sign up for My Chart to have easy access to your labs results, and communication with your primary care physician.  I recommend that you always bring your medications to each appointment as this makes it easy to ensure you are on the correct medications and helps [place] not miss refills when you need them. Call the clinic at [phone] if your symptoms worsen or you have any concerns.  You should return to our clinic Return in about 4 weeks (around 01/12/2023) for HTN. Please arrive 15 minutes before your appointment to ensure smooth check in process.  We appreciate your efforts in making this happen.  Thank you for allowing me to participate in your care, Erskine Emery, MD 12/15/2022, 10:20 AM PGY-2, [place]

## 2022-12-15 NOTE — Progress Notes (Signed)
SUBJECTIVE:   CHIEF COMPLAINT / HPI:   Hypertension: BP: (!) 183/89 today. Home medications include: Losartan 25 mg. She does not endorse taking these medications as prescribed. Has checked blood pressure at home; 130s-140s/80s.   Most recent creatinine trend:  Lab Results  Component Value Date   CREATININE 0.77 03/11/2022   CREATININE 0.80 05/15/2021   CREATININE 0.79 06/08/2019   Patient has had a BMP in the past 1 year.  Osteoporosis: Patient to taking high-dose vitamin D 50,000 units weekly and Fosamax.   HLD: Not taking her Crestor 20 mg daily, LDL 140 4 mo ago. Patient reports that she has had no side effects in the past   Flank Pain: Concerned about pain in the right side, flank to back. Intermittent but has improved since last visit. Not constant pain, was in the past. Worried that the pain is related to her liver, has a history of fatty liver. H/o cholecystectomy. No nausea or vomiting. Had loose stools about a week ago. They have become more formed now since last week. Pain is 2/10, comes unexpectedly. No increase in the pain with movement or positional changes.    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  The family wants to take the sister home, but they would like the patient to watch over her when she transitions home from the hospital.  This is in [place].  This causes some stress on the patient, and she is trying to get her health in control prior to making this move for an unforeseen amount of time.     PERTINENT  PMH / PSH:  Hypertension, asthmatic bronchitis, chronic oropharyngeal dysphagia Seasonal allergic rhinitis Vocal cord paralysis GERD Age-related osteoporosis Osteoarthritis Cataracts  Estrogen def HLD  Depression Sleep disorder  Vit D def    OBJECTIVE:  BP (!) 183/89   Pulse 72   Ht 5' 6"$  (1.676 m)   Wt 196 lb 9.6 oz (89.2 kg)   SpO2  100%   BMI 31.73 kg/m  Physical Exam  General: Alert and oriented in no apparent distress Heart: Regular rate and rhythm with no murmurs appreciated Lungs: CTA bilaterally, no wheezing Abdomen: Bowel sounds present, no abdominal pain, mild TTP without guarding in the RUQ, nondistended, nonperitoneal Skin: Warm and dry  ASSESSMENT/PLAN:  HYPERTENSION, BENIGN SYSTEMIC Assessment & Plan: AHA guidelines based on age <140/80, continue with Losartan 25 mg. Given home blood pressures in the past, decided to continue with losartan 25 with shared decision making. I have sent an increased dosage of Losartan 50 mg to pharmacy. However, HOLD on this for the next couple of weeks until patient has filled out BP log with daily pressures. Continue with losartan 25 for now.   Orders: -     Comprehensive metabolic panel -     Losartan Potassium; Take 1 tablet (25 mg total) by mouth at bedtime.  Dyslipidemia Assessment & Plan: Crestor started again, LDL 140 4 mo ago.   Orders: -     Rosuvastatin Calcium; Take 1 tablet (20 mg total) by mouth daily.  Dispense: 90 tablet; Refill: 0  Vitamin D deficiency Assessment & Plan: Recheck Vit D levels, adjust accordingly.   Orders: -     VITAMIN D 25 Hydroxy (Vit-D Deficiency, Fractures)  Flank pain Assessment & Plan: Unsure etiology, does not seem musculoskeletal in nature based on presentation.  Does not although dermatomal pattern,  less likely shingles without presence of rash.  Up-to-date renal ultrasound and urinary studies rules out urinary etiology.  Possibly related to bowel gas pattern.  Liver enzymes obtained via CMP today.  Nonacute abdomen, reassuringly hemodynamically stable in office.  Additionally, the patient has seen improvement since last visit.  No systemic symptoms. Needs UTD colonoscopy   Orders: -     Comprehensive metabolic panel  Moderate episode of recurrent major depressive disorder (HCC) Assessment & Plan: Social stressors are  contributing to mood currently, continue with current SSRI dosage    Change in bowel movement Assessment & Plan: Given recent change in bowel movements, would advise she continue with up-to-date colonoscopy as she is due for one. Bowel movements have gradually returned to normal.     Return in about 4 weeks (around 01/12/2023) for HTN. Erskine Emery, MD 12/15/2022, 10:37 AM PGY-2, [place]

## 2022-12-15 NOTE — Assessment & Plan Note (Signed)
Recheck Vit D levels, adjust accordingly.

## 2022-12-15 NOTE — Assessment & Plan Note (Signed)
Crestor started again, LDL 140 4 mo ago.

## 2022-12-15 NOTE — Assessment & Plan Note (Signed)
Social stressors are contributing to mood currently, continue with current SSRI dosage

## 2022-12-15 NOTE — Assessment & Plan Note (Signed)
AHA guidelines based on age <140/80, continue with Losartan 25 mg. Given home blood pressures in the past, decided to continue with losartan 25 with shared decision making. I have sent an increased dosage of Losartan 50 mg to pharmacy. However, HOLD on this for the next couple of weeks until patient has filled out BP log with daily pressures. Continue with losartan 25 for now.

## 2022-12-15 NOTE — Assessment & Plan Note (Signed)
Given recent change in bowel movements, would advise she continue with up-to-date colonoscopy as she is due for one. Bowel movements have gradually returned to normal.

## 2022-12-15 NOTE — Assessment & Plan Note (Signed)
Unsure etiology, does not seem musculoskeletal in nature based on presentation.  Does not although dermatomal pattern, less likely shingles without presence of rash.  Up-to-date renal ultrasound and urinary studies rules out urinary etiology.  Possibly related to bowel gas pattern.  Liver enzymes obtained via CMP today.  Nonacute abdomen, reassuringly hemodynamically stable in office.  Additionally, the patient has seen improvement since last visit.  No systemic symptoms. Needs UTD colonoscopy

## 2022-12-16 ENCOUNTER — Other Ambulatory Visit: Payer: Self-pay | Admitting: Student

## 2022-12-16 DIAGNOSIS — M85859 Other specified disorders of bone density and structure, unspecified thigh: Secondary | ICD-10-CM

## 2022-12-16 DIAGNOSIS — E559 Vitamin D deficiency, unspecified: Secondary | ICD-10-CM

## 2022-12-16 LAB — COMPREHENSIVE METABOLIC PANEL
ALT: 9 IU/L (ref 0–32)
AST: 15 IU/L (ref 0–40)
Albumin/Globulin Ratio: 1.9 (ref 1.2–2.2)
Albumin: 4.1 g/dL (ref 3.8–4.8)
Alkaline Phosphatase: 63 IU/L (ref 44–121)
BUN/Creatinine Ratio: 17 (ref 12–28)
BUN: 15 mg/dL (ref 8–27)
Bilirubin Total: 0.3 mg/dL (ref 0.0–1.2)
CO2: 23 mmol/L (ref 20–29)
Calcium: 9.4 mg/dL (ref 8.7–10.3)
Chloride: 103 mmol/L (ref 96–106)
Creatinine, Ser: 0.88 mg/dL (ref 0.57–1.00)
Globulin, Total: 2.2 g/dL (ref 1.5–4.5)
Glucose: 96 mg/dL (ref 70–99)
Potassium: 4.2 mmol/L (ref 3.5–5.2)
Sodium: 141 mmol/L (ref 134–144)
Total Protein: 6.3 g/dL (ref 6.0–8.5)
eGFR: 68 mL/min/{1.73_m2} (ref >59)

## 2022-12-16 LAB — VITAMIN D 25 HYDROXY (VIT D DEFICIENCY, FRACTURES): Vit D, 25-Hydroxy: 34.8 ng/mL (ref 30.0–100.0)

## 2022-12-16 MED ORDER — CALCIUM CITRATE-VITAMIN D 500-12.5 MG-MCG PO CHEW: 1.0000 | CHEWABLE_TABLET | Freq: Every day | ORAL | 2 refills | Status: DC

## 2022-12-19 ENCOUNTER — Other Ambulatory Visit: Payer: Self-pay | Admitting: Student

## 2022-12-19 DIAGNOSIS — J4489 Other specified chronic obstructive pulmonary disease: Secondary | ICD-10-CM

## 2022-12-22 ENCOUNTER — Other Ambulatory Visit: Payer: Self-pay | Admitting: Student

## 2022-12-22 DIAGNOSIS — J4489 Other specified chronic obstructive pulmonary disease: Secondary | ICD-10-CM

## 2022-12-24 ENCOUNTER — Ambulatory Visit: Admission: RE | Admit: 2022-12-24 | Payer: No Typology Code available for payment source | Source: Ambulatory Visit

## 2022-12-24 ENCOUNTER — Ambulatory Visit: Payer: No Typology Code available for payment source

## 2022-12-24 ENCOUNTER — Ambulatory Visit
Admission: RE | Admit: 2022-12-24 | Discharge: 2022-12-24 | Disposition: A | Discharge status: Home / Self Care | Payer: No Typology Code available for payment source | Source: Ambulatory Visit | Attending: Family Medicine | Admitting: Family Medicine

## 2022-12-24 DIAGNOSIS — R922 Inconclusive mammogram: Secondary | ICD-10-CM | POA: Diagnosis not present

## 2022-12-24 DIAGNOSIS — R928 Other abnormal and inconclusive findings on diagnostic imaging of breast: Secondary | ICD-10-CM

## 2023-02-03 ENCOUNTER — Telehealth: Payer: Self-pay | Admitting: Student

## 2023-02-03 NOTE — Telephone Encounter (Signed)
Patient walked in to request refill of:  Name of Medication(s):  Trazodone Last date of OV:  10/14/22 Pharmacy:  same  Will route refill request to Clinic RN.  Discussed with patient policy to call pharmacy for future refills.  Also, discussed refills may take up to 48 hours to approve or deny.  Vilinda Blanks

## 2023-02-06 NOTE — Telephone Encounter (Signed)
Patient is scheduled for a medication discussion appt in 05/14 @350 

## 2023-02-09 ENCOUNTER — Telehealth: Payer: Self-pay | Admitting: Student

## 2023-02-09 NOTE — Telephone Encounter (Signed)
Called patient to schedule Medicare Annual Wellness Visit (AWV). Left message for patient to call back and schedule Medicare Annual Wellness Visit (AWV).  Last date of AWV: 12/17/2021   Please schedule an AWVS appointment at any time with [facility] ANNUAL WELLNESS VISIT.  If any questions, please contact me at [phone].   Thank you,  Makira Holleman  Ambulatory Clinic Support [facility] Direct dial  [phone]   

## 2023-02-17 ENCOUNTER — Telehealth: Payer: Self-pay | Admitting: Student

## 2023-02-17 NOTE — Telephone Encounter (Signed)
Called patient to schedule Medicare Annual Wellness Visit (AWV). Unable to reach patient. Patient answered but could not hear her, if she calls back please schedule her for 02/18/2023 with Dr. Idalia Needle IF she can do it in person if not you can send her to me at [facility].   Last date of AWV: 12/17/2021   If any questions, please contact me at [phone].  Thank you ,  Fabian November

## 2023-02-21 ENCOUNTER — Other Ambulatory Visit: Payer: Self-pay | Admitting: Student

## 2023-02-21 DIAGNOSIS — G8929 Other chronic pain: Secondary | ICD-10-CM

## 2023-02-21 DIAGNOSIS — R109 Unspecified abdominal pain: Secondary | ICD-10-CM

## 2023-03-06 ENCOUNTER — Other Ambulatory Visit: Payer: Self-pay | Admitting: Student

## 2023-03-06 DIAGNOSIS — J4489 Other specified chronic obstructive pulmonary disease: Secondary | ICD-10-CM

## 2023-03-09 ENCOUNTER — Telehealth: Payer: Self-pay

## 2023-03-10 ENCOUNTER — Ambulatory Visit: Payer: No Typology Code available for payment source | Admitting: Student

## 2023-03-10 NOTE — Progress Notes (Deleted)
  SUBJECTIVE:   CHIEF COMPLAINT / HPI:   Medication Refill:   Hypertension:   today. Home medications include: Losartan 25 mg. She endorses taking these medications as prescribed. Does check blood pressure at home.   Most recent creatinine trend:  Lab Results  Component Value Date   CREATININE 0.88 12/15/2022   CREATININE 0.77 03/11/2022   CREATININE 0.80 05/15/2021   Patient has had a BMP in the past 1 year.   PERTINENT  PMH / PSH:  Hypertension, asthmatic bronchitis, chronic oropharyngeal dysphagia Seasonal allergic rhinitis Vocal cord paralysis GERD Age-related osteoporosis Osteoarthritis Cataracts  Estrogen def HLD  Depression Sleep disorder  Vit D def  Patient Care Team: Alfredo Martinez, MD as PCP - General (Family Medicine) Barnie Alderman, MD (Otolaryngology) Nyoka Cowden, MD as Consulting Physician (Pulmonary Disease) Luane School, OD (Optometry) OBJECTIVE:  There were no vitals taken for this visit. Physical Exam   ASSESSMENT/PLAN:  There are no diagnoses linked to this encounter.   Needs medicare annual wellness visit  Exchange PPI for H2 blocker given risk of osteoporosis progression  Needs colonoscopy scheduled   No follow-ups on file. Alfredo Martinez, MD 03/10/2023, 9:21 AM PGY-2, [facility] {    This will disappear when note is signed, click to select method of visit    :1}

## 2023-03-11 DIAGNOSIS — H26491 Other secondary cataract, right eye: Secondary | ICD-10-CM | POA: Diagnosis not present

## 2023-03-11 DIAGNOSIS — H04201 Unspecified epiphora, right lacrimal gland: Secondary | ICD-10-CM | POA: Diagnosis not present

## 2023-03-11 DIAGNOSIS — Z961 Presence of intraocular lens: Secondary | ICD-10-CM | POA: Diagnosis not present

## 2023-03-12 ENCOUNTER — Ambulatory Visit: Payer: No Typology Code available for payment source | Admitting: Family Medicine

## 2023-03-17 ENCOUNTER — Encounter: Payer: Self-pay | Admitting: Family Medicine

## 2023-03-17 ENCOUNTER — Ambulatory Visit: Payer: No Typology Code available for payment source | Admitting: Family Medicine

## 2023-03-17 VITALS — BP 145/82 | HR 82 | Temp 98.7°F | Wt 193.6 lb

## 2023-03-17 DIAGNOSIS — R051 Acute cough: Secondary | ICD-10-CM

## 2023-03-17 DIAGNOSIS — I10 Essential (primary) hypertension: Secondary | ICD-10-CM

## 2023-03-17 MED ORDER — BENZONATATE 200 MG PO CAPS: 200.0000 mg | ORAL_CAPSULE | Freq: Two times a day (BID) | ORAL | 0 refills | Status: DC | PRN

## 2023-03-17 MED ORDER — GUAIFENESIN 200 MG PO TABS: 200.0000 mg | ORAL_TABLET | ORAL | 0 refills | Status: DC | PRN

## 2023-03-17 MED ORDER — ALBUTEROL SULFATE HFA 108 (90 BASE) MCG/ACT IN AERS: 2.0000 | INHALATION_SPRAY | RESPIRATORY_TRACT | 0 refills | Status: AC | PRN

## 2023-03-17 NOTE — Patient Instructions (Addendum)
It was great to see you!  For your cough: -Dr Jena Gauss ordered a chest x-ray several months ago and this should still be good. You can go to [facility] ([address]) any time between 7am-7pm to have this done. No appointment needed.   -I sent an albuterol inhaler to your pharmacy. Use every 4 hours as needed. -I also sent robitussin and tessalon to help with cough -Your symptoms are most likely caused by a virus and this should improve over the next week although the cough can linger for several weeks. -Use honey, tea, lozenges as studies show these work even better than medication  -Follow-up if your symptoms get worse or do not improve  Take care, Dr Anner Crete

## 2023-03-17 NOTE — Progress Notes (Unsigned)
    SUBJECTIVE:   CHIEF COMPLAINT / HPI:   Cough -started 4 days ago but worse 2 days ago -cough -headache -sore throat -congestion -son sick with same -no fever -no vomiting or diarrhea -no shortness of breath -taking OTC cough syrup   PERTINENT  PMH / PSH: ***  OBJECTIVE:   BP (!) 145/82   Pulse 82   Temp 98.7 F (37.1 C)   Wt 193 lb 9.6 oz (87.8 kg)   SpO2 100%   BMI 31.25 kg/m   ***  ASSESSMENT/PLAN:   No problem-specific Assessment & Plan notes found for this encounter.     Maury Dus, MD [facility]

## 2023-03-18 NOTE — Assessment & Plan Note (Signed)
Initial BP elevated today.  Repeat wnl at 120s/70s although unfortunately exact number was not documented.

## 2023-03-20 ENCOUNTER — Other Ambulatory Visit: Payer: Self-pay | Admitting: Student

## 2023-03-20 DIAGNOSIS — R109 Unspecified abdominal pain: Secondary | ICD-10-CM

## 2023-03-20 DIAGNOSIS — R10A0 Flank pain, unspecified side: Secondary | ICD-10-CM

## 2023-03-20 DIAGNOSIS — G8929 Other chronic pain: Secondary | ICD-10-CM

## 2023-03-24 ENCOUNTER — Ambulatory Visit
Admission: RE | Admit: 2023-03-24 | Discharge: 2023-03-24 | Disposition: A | Discharge status: Home / Self Care | Payer: No Typology Code available for payment source | Source: Ambulatory Visit | Attending: Family Medicine | Admitting: Family Medicine

## 2023-03-24 DIAGNOSIS — R0781 Pleurodynia: Secondary | ICD-10-CM

## 2023-03-24 DIAGNOSIS — R109 Unspecified abdominal pain: Secondary | ICD-10-CM

## 2023-03-25 NOTE — Telephone Encounter (Signed)
Needs appt before next refill 

## 2023-03-25 NOTE — Progress Notes (Signed)
Unsuccessful outreach attempt by student pharmacist.  

## 2023-04-01 ENCOUNTER — Encounter: Payer: Self-pay | Admitting: Student

## 2023-04-02 ENCOUNTER — Other Ambulatory Visit: Payer: Self-pay

## 2023-04-02 ENCOUNTER — Ambulatory Visit: Payer: No Typology Code available for payment source | Admitting: Family Medicine

## 2023-04-02 VITALS — BP 147/91 | HR 82 | Ht 66.0 in | Wt 193.0 lb

## 2023-04-02 DIAGNOSIS — J40 Bronchitis, not specified as acute or chronic: Secondary | ICD-10-CM | POA: Diagnosis not present

## 2023-04-02 MED ORDER — AZITHROMYCIN 250 MG PO TABS: ORAL_TABLET | ORAL | 0 refills | Status: DC

## 2023-04-02 NOTE — Patient Instructions (Signed)
It was great seeing you today!  Im sorry you have not been feeling well! I have prescribed an antibiotic to take twice today, and then one a day for the next 4 days.  This infection will still likely take a couple of weeks to recover from.  Continue staying well-hydrated, and using over-the-counter medication for cough and congestion, Tylenol for fever and pain, throat lozenges and honey for sore throat.  Return if symptoms worsen, you are having difficulty breathing, not keeping any fluids down, or other new and concerning symptoms.   Feel free to call with any questions or concerns at any time, at [phone].   Take care,  Dr. Cora Collum [facility]

## 2023-04-02 NOTE — Assessment & Plan Note (Signed)
Patient presents with persistent cough, sore throat and SOB. Reassuringly vitals are stable with good O2 sats. Physical exam benign with normal lung sounds. Given conservative treatment has not helped and symptoms have persisted for over 2 weeks will treat with Azithromycin x 5 days. Return precautions discussed.

## 2023-04-02 NOTE — Progress Notes (Signed)
    SUBJECTIVE:   CHIEF COMPLAINT / HPI:   Patient presents for continued cough and congestion. She was seen on 5/21 for her cough, sore throat and congestion. Symptoms now have been going on for 4 weeks. States she has been having a lot of coughing and not coughing anything up. Throat hurts. State she feels drained and feels that her balance is off. Hasn't had much of an appetite. No vomiting. States her temp the last couple of days of 99.4. Family has been sick as well. Has tried Tylenol, OTC cough medication with guafenesin, Tessalon perles without relief. Does feel she needs to use her Albuterol once or twice a day now.   PERTINENT  PMH / PSH: Reviewed   OBJECTIVE:   BP (!) 147/91   Pulse 82   Ht 5\' 6"  (1.676 m)   Wt 193 lb (87.5 kg)   SpO2 100%   BMI 31.15 kg/m    Physical exam General: well appearing, NAD Cardiovascular: RRR, no murmurs Lungs: CTAB without wheezing or crackles. Normal WOB Abdomen: soft, non-distended, non-tender Skin: warm, dry. No edema  ASSESSMENT/PLAN:   Bronchitis Patient presents with persistent cough, sore throat and SOB. Reassuringly vitals are stable with good O2 sats. Physical exam benign with normal lung sounds. Given conservative treatment has not helped and symptoms have persisted for over 2 weeks will treat with Azithromycin x 5 days. Return precautions discussed.     Cora Collum, DO [facility]

## 2023-04-27 ENCOUNTER — Other Ambulatory Visit: Payer: Self-pay

## 2023-04-27 DIAGNOSIS — I10 Essential (primary) hypertension: Secondary | ICD-10-CM

## 2023-04-28 MED ORDER — LOSARTAN POTASSIUM 25 MG PO TABS: 25.0000 mg | ORAL_TABLET | Freq: Every day | ORAL | Status: DC

## 2023-05-11 MED ORDER — LOSARTAN POTASSIUM 25 MG PO TABS: 25.0000 mg | ORAL_TABLET | Freq: Every day | ORAL | 2 refills | Status: DC

## 2023-05-11 NOTE — Telephone Encounter (Signed)
Received call from patient's insurance regarding losartan. Per chart review, refill from 7/1 was set to "no print."  Pended to this encounter. Refill needs quantity and number of refills.   Forwarding to PCP.   Veronda Prude, RN

## 2023-05-11 NOTE — Addendum Note (Signed)
Addended by: Veronda Prude on: 05/11/2023 11:24 AM   Modules accepted: Orders

## 2023-05-12 DIAGNOSIS — J3801 Paralysis of vocal cords and larynx, unilateral: Secondary | ICD-10-CM | POA: Diagnosis not present

## 2023-05-12 DIAGNOSIS — R1312 Dysphagia, oropharyngeal phase: Secondary | ICD-10-CM | POA: Diagnosis not present

## 2023-05-18 ENCOUNTER — Other Ambulatory Visit: Payer: Self-pay | Admitting: Student

## 2023-05-18 DIAGNOSIS — J4489 Other specified chronic obstructive pulmonary disease: Secondary | ICD-10-CM

## 2023-06-11 ENCOUNTER — Encounter: Payer: Self-pay | Admitting: Pharmacist

## 2023-06-11 ENCOUNTER — Telehealth: Payer: Self-pay | Admitting: Pharmacist

## 2023-06-11 NOTE — Telephone Encounter (Signed)
Patient profile showed up on adherence measures for risk of failing measure with losartan. According to pharmacy fill history report, patient has not filled losartan since 04/02/23 (30 day supply).  Called to discuss medication with patient, but there was no answer and voicemail box was not set up.  Called patient's daughter and left HIPAA compliant message asking her to please call us back to discuss this medication with the patient/daughter.

## 2023-06-11 NOTE — Progress Notes (Addendum)
Adherence check-in. Per Dr. Tiajuana Amass filled 2/19. 90days. Due 05/16 - No recent fills - attempted to contact. Patient - no VM available and no answer - Called daughter and left message requesting call back - losartan refill.

## 2023-06-17 ENCOUNTER — Other Ambulatory Visit: Payer: Self-pay | Admitting: Student

## 2023-06-17 DIAGNOSIS — J4489 Other specified chronic obstructive pulmonary disease: Secondary | ICD-10-CM

## 2023-06-23 ENCOUNTER — Ambulatory Visit: Payer: No Typology Code available for payment source | Admitting: Family Medicine

## 2023-06-23 NOTE — Progress Notes (Deleted)
New Patient Office Visit  Subjective    Patient ID: Andrea Buck, female    DOB: 12/27/1945  Age: 77 y.o. MRN: [id number]  CC: No chief complaint on file.   HPI Andrea Buck presents to establish care ***  vision  PMH: HTN, MDD, HLD,   PSH: ***  FH: ***  Tobacco use: *** Alcohol use: *** Drug use: *** Marital status: *** Employment: *** Sexual hx: ***  Screenings:  Colon Cancer: *** Lung Cancer: *** Breast Cancer: *** Diabetes: *** HLD: ***   Outpatient Encounter Medications as of 06/23/2023  Medication Sig   albuterol (VENTOLIN HFA) 108 (90 Base) MCG/ACT inhaler Inhale 2 puffs into the lungs every 4 (four) hours as needed for wheezing or shortness of breath.   alendronate (FOSAMAX) 35 MG tablet TAKE 1 TABLET BY MOUTH ONCE A WEEK. TAKE WITH A FULL GLASS OF WATER ON AN EMPTY STOMACH   azithromycin (ZITHROMAX Z-PAK) 250 MG tablet Take 500 mg orally on day 1 followed by 250 mg/day orally on days 2 to 5   benzonatate (TESSALON) 200 MG capsule Take 1 capsule (200 mg total) by mouth 2 (two) times daily as needed for cough.   Calcium Citrate-Vitamin D 500-12.5 MG-MCG CHEW Chew 1 tablet by mouth daily.   cetirizine (ZYRTEC) 10 MG tablet Take 1 tablet (10 mg total) by mouth daily.   famotidine (PEPCID) 20 MG tablet TAKE 1 TABLET BY MOUTH ONCE DAILY AFTER SUPPER   guaiFENesin 200 MG tablet Take 1 tablet (200 mg total) by mouth every 4 (four) hours as needed for cough or to loosen phlegm.   losartan (COZAAR) 25 MG tablet Take 1 tablet (25 mg total) by mouth at bedtime.   meloxicam (MOBIC) 15 MG tablet Take 1 tablet by mouth once daily   mometasone (NASONEX) 50 MCG/ACT nasal spray Place 1 spray into the nose daily.   Multiple Vitamin (MULTI-VITAMINS) TABS Take by mouth.   pantoprazole (PROTONIX) 40 MG tablet TAKE 1 TABLET BY MOUTH ONCE DAILY TAKE  30-60  MINUTES  BEFORE  FIRST  MEAL  OF  THE  DAY   rosuvastatin (CRESTOR) 20 MG tablet Take 1 tablet (20 mg total) by mouth  daily.   sertraline (ZOLOFT) 100 MG tablet TAKE 1 & 1/2 (ONE & ONE-HALF) TABLETS BY MOUTH ONCE DAILY   tiZANidine (ZANAFLEX) 4 MG tablet Take 1 tablet (4 mg total) by mouth every 6 (six) hours as needed for muscle spasms.   traZODone (DESYREL) 50 MG tablet Take 1 tablet (50 mg total) by mouth at bedtime.   Vibegron (GEMTESA) 75 MG TABS Take 1 mg by mouth.   Vitamin D, Ergocalciferol, (DRISDOL) 1.25 MG (50000 UNIT) CAPS capsule Take 1 capsule by mouth once a week   No facility-administered encounter medications on file as of 06/23/2023.    Past Medical History:  Diagnosis Date   Acute cystitis with hematuria 07/22/2018   Anxiety and depression 09/06/2018   Blood transfusion    after hysterectomy   Bronchitis, chronic (HCC) 2005   Carpal tunnel syndrome 12/24/2006   Qualifier: Diagnosis of  By: Dewitt Rota     CHOLELITHIASIS 04/25/2009   Qualifier: Diagnosis of  By: Karn Pickler MD, Jessica     COPD (chronic obstructive pulmonary disease) (HCC)    Depression    GASTROESOPHAGEAL REFLUX, NO ESOPHAGITIS 12/24/2006   Qualifier: Diagnosis of  By: Willow Ora TUMOR 2001   Qualifier: Diagnosis of  By: Karn Pickler MD, Jessica     Hyperlipidemia    Hypertension    MENOPAUSAL SYNDROME 12/24/2006   Qualifier: Diagnosis of  By: Dewitt Rota     NECK MASS 12/29/2006   Qualifier: Diagnosis of  By: Christell Constant CMA, [place]     Sciatica 02/10/2017   Screening for hypothyroidism 07/21/2017   Vocal cord paralysis 05/26/2011   RIGHT vocal cord parlaysis Seen by Dr,. Rayford Halsted at [facility].( May 03, 2011)  Placed on scopalamine patch and w f/u [facility]     Past Surgical History:  Procedure Laterality Date   ABDOMINAL HYSTERECTOMY     CARPAL TUNNEL RELEASE     CHOLECYSTECTOMY     LUMBAR LAMINECTOMY     (4-5)   palatal adhesion     removal of glomus vagales tumor-non cancerious     TUBAL LIGATION     UPPER GASTROINTESTINAL ENDOSCOPY  2006    Family History   Problem Relation Age of Onset   Cancer Mother        Uterus   Diabetes Mother    Hypertension Mother    Heart disease Father    Stroke Father    Hypertension Father    Diabetes Father    Obesity Sister    Diabetes Sister    Hypertension Sister    Diabetes Sister    Hypertension Sister    Diabetes Sister    Hypertension Sister    Diabetes Brother    Multiple sclerosis Brother    Diabetes Brother    Hypertension Brother    Mental illness Son        PTSD   Diabetes Son    Hypertension Son    Hyperlipidemia Son    Obesity Son    Drug abuse Son    Arthritis Son    Drug abuse Son    Hyperlipidemia Son    Colon cancer Neg Hx    Colon polyps Neg Hx    Stomach cancer Neg Hx    Esophageal cancer Neg Hx    Rectal cancer Neg Hx     Social History   Socioeconomic History   Marital status: Widowed    Spouse name: Not on file   Number of children: 5   Years of education: 14   Highest education level: Associate degree: occupational, Scientist, product/process development, or vocational program  Occupational History   Occupation: LPN    Employer: [employer]  Tobacco Use   Smoking status: Never    Passive exposure: Never   Smokeless tobacco: Never  Vaping Use   Vaping status: Never Used  Substance and Sexual Activity   Alcohol use: No   Drug use: No   Sexual activity: Yes    Birth control/protection: Post-menopausal    Comment: Hysterectomy  Other Topics Concern   Not on file  Social History Narrative   Emergency Contact: Daughter, Marchia Bond (c) [phone]   End of Life Plan: Info on Advanced Directives given   Who lives with you: Patient lives with daughter and son in Social [place].    Any pets: None   Exercise:Tries to exercise twice weekly   Seatbelts: Patient reports wearing her seatbelt when in vehicle.    Religous: Patient is very active in her church.          Patient has stressors with her sons. Patient reports one has had multiple strokes and one is homeless.       Patient is happy with  her  current living situation as this has been an issue in the past. Patient encouraged to reach out if/when she would like to change this.    Social Determinants of Health   Financial Resource Strain: Low Risk  (12/17/2021)   Overall Financial Resource Strain (CARDIA)    Difficulty of Paying Living Expenses: Not hard at all  Food Insecurity: Food Insecurity Present (12/17/2021)   Hunger Vital Sign    Worried About Running Out of Food in the Last Year: Sometimes true    Ran Out of Food in the Last Year: Sometimes true  Transportation Needs: No Transportation Needs (12/17/2021)   PRAPARE - Administrator, Civil Service (Medical): No    Lack of Transportation (Non-Medical): No  Physical Activity: Insufficiently Active (12/17/2021)   Exercise Vital Sign    Days of Exercise per Week: 2 days    Minutes of Exercise per Session: 40 min  Stress: No Stress Concern Present (12/17/2021)   [employer] of Occupational Health - Occupational Stress Questionnaire    Feeling of Stress : Only a little  Social Connections: Moderately Integrated (12/17/2021)   Social Connection and Isolation Panel [NHANES]    Frequency of Communication with Friends and Family: More than three times a week    Frequency of Social Gatherings with Friends and Family: More than three times a week    Attends Religious Services: More than 4 times per year    Active Member of [employer] or Organizations: Yes    Attends Banker Meetings: More than 4 times per year    Marital Status: Widowed  Intimate Partner Violence: Not At Risk (12/17/2021)   Humiliation, Afraid, Rape, and Kick questionnaire    Fear of Current or Ex-Partner: No    Emotionally Abused: No    Physically Abused: No    Sexually Abused: No    ROS      Objective    There were no vitals taken for this visit.  Physical Exam     Assessment & Plan:   There are no diagnoses linked to this encounter.  No follow-ups on file.   [name], MD

## 2023-07-07 ENCOUNTER — Ambulatory Visit (INDEPENDENT_AMBULATORY_CARE_PROVIDER_SITE_OTHER): Payer: No Typology Code available for payment source | Admitting: Family Medicine

## 2023-07-07 ENCOUNTER — Encounter: Payer: Self-pay | Admitting: Family Medicine

## 2023-07-07 VITALS — BP 134/82 | HR 72 | Ht 65.0 in | Wt 188.4 lb

## 2023-07-07 DIAGNOSIS — G479 Sleep disorder, unspecified: Secondary | ICD-10-CM | POA: Diagnosis not present

## 2023-07-07 DIAGNOSIS — E785 Hyperlipidemia, unspecified: Secondary | ICD-10-CM | POA: Diagnosis not present

## 2023-07-07 DIAGNOSIS — R55 Syncope and collapse: Secondary | ICD-10-CM | POA: Diagnosis not present

## 2023-07-07 DIAGNOSIS — E559 Vitamin D deficiency, unspecified: Secondary | ICD-10-CM

## 2023-07-07 MED ORDER — TRAZODONE HCL 50 MG PO TABS: 50.0000 mg | ORAL_TABLET | Freq: Every day | ORAL | 1 refills | Status: DC

## 2023-07-07 MED ORDER — ROSUVASTATIN CALCIUM 20 MG PO TABS: 20.0000 mg | ORAL_TABLET | Freq: Every day | ORAL | 3 refills | Status: DC

## 2023-07-07 NOTE — Progress Notes (Unsigned)
New Patient Office Visit  Subjective    Patient ID: Andrea Buck, female    DOB: [birth year]  Age: 77 y.o. MRN: [id number]  CC:  Chief Complaint  Patient presents with  . New Patient (Initial Visit)    HPI Andrea Buck presents to establish care Patient was previously being seen at the [facility] residency clinic where I was her primary care provider from 2019-2022.  She is establishing care with [place] because we are closer to her house.  Today patient complains of 2 episodes of syncope going back in the past year.  First 1 was October of last year when she was taking her daughter to the doctor's office.  She needed to have a bowel movement and when she was ambulating to the bathroom she developed sudden loss of consciousness.  She was taken to the ambulance and evaluated in the ED.  The second time it happened was 3 weeks ago.  She had just been at a restaurant and then needed to go to the bathroom again (bowel movement) and when she got out of the car she became "dizzy" and had a syncopal episode in which she lost consciousness briefly.  When she woke up she started vomiting.  Eventually return to normal.  Did not go to the emergency department.  Prior to having the single episode she said she "felt hot" and her head was spinning.  Patient currently lives with her daughter.  She is retired.  She does not use tobacco or alcohol.  We went over her medication list.  She is not taking rosuvastatin currently because she was told to stop taking it but she is unsure why.  She states she also needs a refill on trazodone which she takes occasionally for sleep the past several years.  PMH: HTN, MDD, HLD,   PSH: Abdominal hysterectomy, cholecystectomy,  FH: Diabetes and hypertension in several members of her family  Tobacco use: never smoked Alcohol use: No Drug use: No Marital status: Widowed Employment: Retired  Screenings:  Colon Cancer: due for another.      Outpatient Encounter  Medications as of 07/07/2023  Medication Sig  . albuterol (VENTOLIN HFA) 108 (90 Base) MCG/ACT inhaler Inhale 2 puffs into the lungs every 4 (four) hours as needed for wheezing or shortness of breath.  Marland Kitchen alendronate (FOSAMAX) 35 MG tablet TAKE 1 TABLET BY MOUTH ONCE A WEEK. TAKE WITH A FULL GLASS OF WATER ON AN EMPTY STOMACH  . azithromycin (ZITHROMAX Z-PAK) 250 MG tablet Take 500 mg orally on day 1 followed by 250 mg/day orally on days 2 to 5  . benzonatate (TESSALON) 200 MG capsule Take 1 capsule (200 mg total) by mouth 2 (two) times daily as needed for cough.  . Calcium Citrate-Vitamin D 500-12.5 MG-MCG CHEW Chew 1 tablet by mouth daily.  . cetirizine (ZYRTEC) 10 MG tablet Take 1 tablet (10 mg total) by mouth daily.  . famotidine (PEPCID) 20 MG tablet TAKE 1 TABLET BY MOUTH ONCE DAILY AFTER SUPPER  . guaiFENesin 200 MG tablet Take 1 tablet (200 mg total) by mouth every 4 (four) hours as needed for cough or to loosen phlegm.  Marland Kitchen losartan (COZAAR) 25 MG tablet Take 1 tablet (25 mg total) by mouth at bedtime.  . meloxicam (MOBIC) 15 MG tablet Take 1 tablet by mouth once daily  . mometasone (NASONEX) 50 MCG/ACT nasal spray Place 1 spray into the nose daily.  . Multiple Vitamin (MULTI-VITAMINS) TABS Take by mouth.  Marland Kitchen  pantoprazole (PROTONIX) 40 MG tablet TAKE 1 TABLET BY MOUTH ONCE DAILY TAKE  30-60  MINUTES  BEFORE  FIRST  MEAL  OF  THE  DAY  . rosuvastatin (CRESTOR) 20 MG tablet Take 1 tablet (20 mg total) by mouth daily.  . sertraline (ZOLOFT) 100 MG tablet TAKE 1 & 1/2 (ONE & ONE-HALF) TABLETS BY MOUTH ONCE DAILY  . tiZANidine (ZANAFLEX) 4 MG tablet Take 1 tablet (4 mg total) by mouth every 6 (six) hours as needed for muscle spasms.  . traZODone (DESYREL) 50 MG tablet Take 1 tablet (50 mg total) by mouth at bedtime.  . Vibegron (GEMTESA) 75 MG TABS Take 1 mg by mouth.  . Vitamin D, Ergocalciferol, (DRISDOL) 1.25 MG (50000 UNIT) CAPS capsule Take 1 capsule by mouth once a week  . [DISCONTINUED]  rosuvastatin (CRESTOR) 20 MG tablet Take 1 tablet (20 mg total) by mouth daily.  . [DISCONTINUED] traZODone (DESYREL) 50 MG tablet Take 1 tablet (50 mg total) by mouth at bedtime.   No facility-administered encounter medications on file as of 07/07/2023.    Past Medical History:  Diagnosis Date  . Acute cystitis with hematuria 07/22/2018  . Anxiety and depression 09/06/2018  . Blood transfusion    after hysterectomy  . Bronchitis, chronic (HCC) 2005  . Carpal tunnel syndrome 12/24/2006   Qualifier: Diagnosis of  By: Dewitt Rota    . CHOLELITHIASIS 04/25/2009   Qualifier: Diagnosis of  By: Karn Pickler MD, Shanda Bumps    . COPD (chronic obstructive pulmonary disease) (HCC)   . Depression   . GASTROESOPHAGEAL REFLUX, NO ESOPHAGITIS 12/24/2006   Qualifier: Diagnosis of  By: Dewitt Rota    . GLOMUS TUMOR 2001   Qualifier: Diagnosis of  By: Karn Pickler MD, Shanda Bumps    . Hyperlipidemia   . Hypertension   . MENOPAUSAL SYNDROME 12/24/2006   Qualifier: Diagnosis of  By: Dewitt Rota    . NECK MASS 12/29/2006   Qualifier: Diagnosis of  By: Christell Constant CMA, [place]    . Sciatica 02/10/2017  . Screening for hypothyroidism 07/21/2017  . Vocal cord paralysis 05/26/2011   RIGHT vocal cord parlaysis Seen by Dr,. Rayford Halsted at [facility].( May 03, 2011)  Placed on scopalamine patch and w f/u [facility]     Past Surgical History:  Procedure Laterality Date  . ABDOMINAL HYSTERECTOMY    . CARPAL TUNNEL RELEASE    . CHOLECYSTECTOMY    . LUMBAR LAMINECTOMY     (4-5)  . palatal adhesion    . removal of glomus vagales tumor-non cancerious    . TUBAL LIGATION    . UPPER GASTROINTESTINAL ENDOSCOPY  2006    Family History  Problem Relation Age of Onset  . Cancer Mother        Uterus  . Diabetes Mother   . Hypertension Mother   . Heart disease Father   . Stroke Father   . Hypertension Father   . Diabetes Father   . Obesity Sister   . Diabetes Sister   . Hypertension Sister    . Diabetes Sister   . Hypertension Sister   . Diabetes Sister   . Hypertension Sister   . Diabetes Brother   . Multiple sclerosis Brother   . Diabetes Brother   . Hypertension Brother   . Mental illness Son        PTSD  . Diabetes Son   . Hypertension Son   . Hyperlipidemia Son   .  Obesity Son   . Drug abuse Son   . Arthritis Son   . Drug abuse Son   . Hyperlipidemia Son   . Colon cancer Neg Hx   . Colon polyps Neg Hx   . Stomach cancer Neg Hx   . Esophageal cancer Neg Hx   . Rectal cancer Neg Hx     Social History   Socioeconomic History  . Marital status: Widowed    Spouse name: Not on file  . Number of children: 5  . Years of education: 32  . Highest education level: Associate degree: occupational, Scientist, product/process development, or vocational program  Occupational History  . Occupation: LPN    Employer: [employer]  Tobacco Use  . Smoking status: Never    Passive exposure: Never  . Smokeless tobacco: Never  Vaping Use  . Vaping status: Never Used  Substance and Sexual Activity  . Alcohol use: No  . Drug use: No  . Sexual activity: Yes    Birth control/protection: Post-menopausal    Comment: Hysterectomy  Other Topics Concern  . Not on file  Social History Narrative   Emergency Contact: Daughter, Marchia Bond (c) [phone]   End of Life Plan: Info on Advanced Directives given   Who lives with you: Patient lives with daughter and son in law.    Any pets: None   Exercise:Tries to exercise twice weekly   Seatbelts: Patient reports wearing her seatbelt when in vehicle.    Religous: Patient is very active in her church.          Patient has stressors with her sons. Patient reports one has had multiple strokes and one is homeless.       Patient is happy with her current living situation as this has been an issue in the past. Patient encouraged to reach out if/when she would like to change this.    Social Determinants of Health   Financial Resource Strain: Low Risk   (12/17/2021)   Overall Financial Resource Strain (CARDIA)   . Difficulty of Paying Living Expenses: Not hard at all  Food Insecurity: Food Insecurity Present (12/17/2021)   Hunger Vital Sign   . Worried About Programme researcher, broadcasting/film/video in the Last Year: Sometimes true   . Ran Out of Food in the Last Year: Sometimes true  Transportation Needs: No Transportation Needs (12/17/2021)   PRAPARE - Transportation   . Lack of Transportation (Medical): No   . Lack of Transportation (Non-Medical): No  Physical Activity: Insufficiently Active (12/17/2021)   Exercise Vital Sign   . Days of Exercise per Week: 2 days   . Minutes of Exercise per Session: 40 min  Stress: No Stress Concern Present (12/17/2021)   [employer] of Occupational Health - Occupational Stress Questionnaire   . Feeling of Stress : Only a little  Social Connections: Moderately Integrated (12/17/2021)   Social Connection and Isolation Panel [NHANES]   . Frequency of Communication with Friends and Family: More than three times a week   . Frequency of Social Gatherings with Friends and Family: More than three times a week   . Attends Religious Services: More than 4 times per year   . Active Member of Clubs or Organizations: Yes   . Attends Banker Meetings: More than 4 times per year   . Marital Status: Widowed  Intimate Partner Violence: Not At Risk (12/17/2021)   Humiliation, Afraid, Rape, and Kick questionnaire   . Fear of Current or Ex-Partner: No   .  Emotionally Abused: No   . Physically Abused: No   . Sexually Abused: No    ROS      Objective    BP 134/82   Pulse 72   Ht 5\' 5"  (1.651 m)   Wt 188 lb 6.4 oz (85.5 kg)   SpO2 97%   BMI 31.35 kg/m   Physical Exam General: Alert, oriented HEENT: PERRLA, EOMI, false teeth CV: Regular rate rhythm Pulmonary: Lungs clear to auscultation bilaterally GI: Soft, nontender. MSK: Normal gait Psych: pleasant affect.      Assessment & Plan:   Syncope,  unspecified syncope type -     ECHOCARDIOGRAM COMPLETE; Future -     CBC; Future  Dyslipidemia -     Rosuvastatin Calcium; Take 1 tablet (20 mg total) by mouth daily.  Dispense: 90 tablet; Refill: 3 -     Hemoglobin A1c; Future -     Lipid panel; Future  Sleep disorder -     traZODone HCl; Take 1 tablet (50 mg total) by mouth at bedtime.  Dispense: 90 tablet; Refill: 1  Vitamin D deficiency -     VITAMIN D 25 Hydroxy (Vit-D Deficiency, Fractures); Future    Return in about 4 weeks (around 08/04/2023) for hld.   [name], MD

## 2023-07-07 NOTE — Patient Instructions (Addendum)
It was nice to see you today,  We addressed the following topics today: -For your fainting episodes I would like to get a echocardiogram.  I will order this and someone will call you to schedule it.  We can talk about the results at your next visit in 1 month - I have resent in prescriptions for Crestor for your cholesterol and trazodone to help you sleep. - When you come in in 1 month I would like you to bring your medications with you if possible and we can discuss which medicines you need to take and which ones we could possibly discontinue -The doctor who performed her colonoscopy was Dr. Meridee Score.  Their number is [phone].  You can call them to schedule your next colonoscopy.  Have a great day,  Frederic Jericho, MD

## 2023-07-09 NOTE — Assessment & Plan Note (Signed)
Refilled rosuvastatin.  Will get A1c in lipid panel.

## 2023-07-09 NOTE — Assessment & Plan Note (Signed)
Patient requests trazodone refill.  Has used it as needed for the last few years with out issue.  Refill sent in.

## 2023-07-09 NOTE — Assessment & Plan Note (Signed)
2 episodes in the past year, last 1 being 3 weeks ago.  Does not appear to be orthostatic.  Appears to have some association with bowel movements, making vasovagal syncope more likely but do not see any echocardiogram in the past.  Will get echocardiogram and consider long-term cardiac monitoring to rule out cardiogenic syncope.

## 2023-07-24 NOTE — Addendum Note (Signed)
Addended by: Lamonte Sakai, Osias Resnick D on: 07/24/2023 11:57 AM   Modules accepted: Orders

## 2023-08-04 ENCOUNTER — Other Ambulatory Visit: Payer: Self-pay | Admitting: Family Medicine

## 2023-08-04 ENCOUNTER — Other Ambulatory Visit: Payer: Self-pay | Admitting: Student

## 2023-08-04 DIAGNOSIS — M85859 Other specified disorders of bone density and structure, unspecified thigh: Secondary | ICD-10-CM

## 2023-08-04 NOTE — Telephone Encounter (Signed)
The patient left after initial visit without making a follow-up appointment.  She also has not come in for the labs we ordered.  She was supposed to make a appointment 4 weeks after the initial visit which would be the beginning of October.  Please call her and have her schedule an appointment with [place] as soon as she can.  If it is not a morning appointment she will need to also make a separate lab visit to get labs prior to seeing me.  I have refused the medications because I will need her vitamin D level and calcium checked first.  I refused to be gabapentin because it looks like she has not taken it since February and I would like to discuss with her why she takes it and how often she takes it.

## 2023-08-05 ENCOUNTER — Encounter (HOSPITAL_COMMUNITY): Payer: Self-pay | Admitting: Gastroenterology

## 2023-08-05 NOTE — Telephone Encounter (Signed)
I LVM for patient to return call

## 2023-08-18 ENCOUNTER — Telehealth (HOSPITAL_COMMUNITY): Payer: Self-pay | Admitting: Gastroenterology

## 2023-08-18 ENCOUNTER — Ambulatory Visit (INDEPENDENT_AMBULATORY_CARE_PROVIDER_SITE_OTHER): Payer: No Typology Code available for payment source | Admitting: Family Medicine

## 2023-08-18 ENCOUNTER — Encounter: Payer: Self-pay | Admitting: Family Medicine

## 2023-08-18 VITALS — BP 134/85 | HR 71 | Ht 65.0 in | Wt 187.1 lb

## 2023-08-18 DIAGNOSIS — M810 Age-related osteoporosis without current pathological fracture: Secondary | ICD-10-CM | POA: Diagnosis not present

## 2023-08-18 DIAGNOSIS — L299 Pruritus, unspecified: Secondary | ICD-10-CM | POA: Diagnosis not present

## 2023-08-18 DIAGNOSIS — E559 Vitamin D deficiency, unspecified: Secondary | ICD-10-CM

## 2023-08-18 DIAGNOSIS — F331 Major depressive disorder, recurrent, moderate: Secondary | ICD-10-CM

## 2023-08-18 DIAGNOSIS — E785 Hyperlipidemia, unspecified: Secondary | ICD-10-CM | POA: Diagnosis not present

## 2023-08-18 DIAGNOSIS — J4489 Other specified chronic obstructive pulmonary disease: Secondary | ICD-10-CM

## 2023-08-18 DIAGNOSIS — R55 Syncope and collapse: Secondary | ICD-10-CM | POA: Diagnosis not present

## 2023-08-18 DIAGNOSIS — R7301 Impaired fasting glucose: Secondary | ICD-10-CM | POA: Diagnosis not present

## 2023-08-18 NOTE — Assessment & Plan Note (Signed)
Continues to use inhaler as needed.  Next appointment with pulmonology is July of next year.

## 2023-08-18 NOTE — Assessment & Plan Note (Signed)
Do not see any clear medication that would have caused this.  No rash visible on the hands.  Advised patient to continue taking cetirizine as needed.  Getting CMP to check bilirubin levels.  Adding TSH and CBC.

## 2023-08-18 NOTE — Progress Notes (Unsigned)
Established Patient Office Visit  Subjective   Patient ID: Andrea Buck, female    DOB: [birth year]  Age: 77 y.o. MRN: [id number]  Chief Complaint  Patient presents with   Medical Management of Chronic Issues    HPI  Patient continues to take her inhaler occasionally.  Patient taking her sertraline regularly.  Patient taking her atorvastatin.  No questions or concerns regarding this medication.  No side effects.  We discussed with the patient and her getting an echo.  Discussed that the cardiologist is reaching out to her to try to schedule that.  Advised her we will try to schedule this before she leaves.  Has not had any syncopal episodes since her last visit.  Patient wants to know why her vitamin D was not refilled.  We discussed getting the vitamin D and calcium level checked before restarting it and deciding if we need to do daily or weekly dosing.  Patient also asking why her Fosamax was not refilled.  Unsure, but did discuss that there was a note from 2022 with pulmonology that recommended stopping Fosamax.  This appeared to be related to complaints of dysphagia.  Patient taking her blood pressure medication.  She states occasionally she will check her blood pressure and will be "really low" in the 90s systolic.  She gets a little lightheaded with this but no feelings of passing out.  These episodes occur randomly.  Patient is in the process of trying to get her colonoscopy scheduled.  There is an issue of whether she needs to pay a co-pay or not.  This is related to whether or not it is a preventative visit or not.  Patient complains of continued pruritus in the hands.  She brought her medications and we went over them.  She has not had any new medications recently.  The only herbal supplement she is taking is something with CMOS, black seed oil, ashwaganda and other ingredients in it.  She has been taking this for years for pain.     The 10-year ASCVD risk score (Arnett DK,  et al., 2019) is: 19.1%  Health Maintenance Due  Topic Date Due   DTaP/Tdap/Td (2 - Tdap) 05/27/2009   Colonoscopy  07/28/2021   Medicare Annual Wellness (AWV)  12/17/2022   INFLUENZA VACCINE  05/28/2023   COVID-19 Vaccine (4 - 2023-24 season) 06/28/2023      Objective:     BP 134/85   Pulse 71   Ht 5\' 5"  (1.651 m)   Wt 187 lb 1.9 oz (84.9 kg)   SpO2 94%   BMI 31.14 kg/m  {Vitals History (Optional):23777}  Physical Exam General: Alert, oriented CV: Regular rhythm Pulmonary: Lungs clear bilaterally psych: Pleasant affect   No results found for any visits on 08/18/23.      Assessment & Plan:   Moderate episode of recurrent major depressive disorder [facility]) Assessment & Plan: Continues to take sertraline.  Improved symptoms..  Continue current medication.     08/18/2023   10:11 AM 07/07/2023    3:06 PM 04/02/2023    3:15 PM 12/15/2022    9:52 AM 10/14/2022    3:13 PM  Depression screen PHQ 2/9  Decreased Interest 1 2  1 1   Down, Depressed, Hopeless 2 1 3 2  0  PHQ - 2 Score 3 3 3 3 1   Altered sleeping 2 3 3 2 2   Tired, decreased energy 2 3 3 1 2   Change in appetite 1 2 3 1  2  Feeling bad or failure about yourself  1 1 2 1 1   Trouble concentrating 1 1 2 1  0  Moving slowly or fidgety/restless 1 1 2  0 1  Suicidal thoughts 0 0 1 0 0  PHQ-9 Score 11 14 19 9 9   Difficult doing work/chores Somewhat difficult Somewhat difficult Somewhat difficult Not difficult at all       Asthmatic bronchitis , chronic (HCC)  vs UACS Assessment & Plan: Continues to use inhaler as needed.  Next appointment with pulmonology is July of next year.   Chronic pruritus Assessment & Plan: Do not see any clear medication that would have caused this.  No rash visible on the hands.  Advised patient to continue taking cetirizine as needed.  Getting CMP to check bilirubin levels.  Adding TSH and CBC.  Orders: -     Comprehensive metabolic panel -     TSH  Syncope, unspecified syncope  type Assessment & Plan: Will work with patient to get echo scheduled before she leaves  Orders: -     CBC  Vitamin D deficiency Assessment & Plan: Will repeat vitamin D and CMP before deciding whether to do daily supplementation or weekly.  Was normal last time it was checked.  Orders: -     VITAMIN D 25 Hydroxy (Vit-D Deficiency, Fractures)  Dyslipidemia -     Lipid panel -     Hemoglobin A1c  Age-related osteoporosis without current pathological fracture Assessment & Plan: At some point in 2022 it appears the pulmonologist notes recommended stopping Fosamax.  This may have been due to esophagitis.  Patient has continued to take the medication since it only ran out last month.  During that time her dysphagia symptoms improved.  Will get vitamin D and calcium levels today and then refill if appropriate.      Return in about 3 months (around 11/18/2023) for HTN, syncope.    [name], MD

## 2023-08-18 NOTE — Telephone Encounter (Signed)
Okay, she is hopefully coming to her appointment today.  If she comes I can try to get her to schedule it before she leaves.

## 2023-08-18 NOTE — Telephone Encounter (Signed)
Just an FYI. We have made several attempts to contact this patient including sending a letter to schedule or reschedule their echocardiogram. We will be removing the patient from the echo WQ.   08/05/23 [facility] to schedule @ 9:59/LBW  08/04/23@350  [facility] EVD  07/31/23 [facility] to schedule @ 8:55/LBW  09/27/24Called and would not go thru at this time 2 12:03/LBW        Thank you

## 2023-08-18 NOTE — Assessment & Plan Note (Signed)
At some point in 2022 it appears the pulmonologist notes recommended stopping Fosamax.  This may have been due to esophagitis.  Patient has continued to take the medication since it only ran out last month.  During that time her dysphagia symptoms improved.  Will get vitamin D and calcium levels today and then refill if appropriate.

## 2023-08-18 NOTE — Patient Instructions (Addendum)
It was nice to see you today,  We addressed the following topics today: -We will try to help you schedule the echocardiogram - Your colonoscopy should be a preventative treatment as it is following up on your polyps and you have never been diagnosed with colon cancer, but I cannot help you with the insurance aspect of that - I will get some blood test today and once we get the results of them we will decide what dose of vitamin D and if you need to be on the Fosamax again - Continue taking your medications as prescribed.  No changes made today. - Continue taking the antihistamine as needed for the itchiness.  If you develop swelling, difficulty breathing or full-body rash you should seek medical attention immediately.  Have a great day,  Frederic Jericho, MD

## 2023-08-18 NOTE — Assessment & Plan Note (Signed)
Will work with patient to get echo scheduled before she leaves

## 2023-08-18 NOTE — Assessment & Plan Note (Signed)
Will repeat vitamin D and CMP before deciding whether to do daily supplementation or weekly.  Was normal last time it was checked.

## 2023-08-18 NOTE — Assessment & Plan Note (Signed)
Continues to take sertraline.  Improved symptoms..  Continue current medication.     08/18/2023   10:11 AM 07/07/2023    3:06 PM 04/02/2023    3:15 PM 12/15/2022    9:52 AM 10/14/2022    3:13 PM  Depression screen PHQ 2/9  Decreased Interest 1 2  1 1   Down, Depressed, Hopeless 2 1 3 2  0  PHQ - 2 Score 3 3 3 3 1   Altered sleeping 2 3 3 2 2   Tired, decreased energy 2 3 3 1 2   Change in appetite 1 2 3 1 2   Feeling bad or failure about yourself  1 1 2 1 1   Trouble concentrating 1 1 2 1  0  Moving slowly or fidgety/restless 1 1 2  0 1  Suicidal thoughts 0 0 1 0 0  PHQ-9 Score 11 14 19 9 9   Difficult doing work/chores Somewhat difficult Somewhat difficult Somewhat difficult Not difficult at all

## 2023-08-19 LAB — COMPREHENSIVE METABOLIC PANEL
ALT: 7 [IU]/L (ref 0–32)
AST: 14 [IU]/L (ref 0–40)
Albumin: 4.2 g/dL (ref 3.8–4.8)
Alkaline Phosphatase: 59 [IU]/L (ref 44–121)
BUN/Creatinine Ratio: 16 (ref 12–28)
BUN: 13 mg/dL (ref 8–27)
Bilirubin Total: 0.4 mg/dL (ref 0.0–1.2)
CO2: 25 mmol/L (ref 20–29)
Calcium: 9.2 mg/dL (ref 8.7–10.3)
Chloride: 104 mmol/L (ref 96–106)
Creatinine, Ser: 0.81 mg/dL (ref 0.57–1.00)
Globulin, Total: 2.5 g/dL (ref 1.5–4.5)
Glucose: 107 mg/dL — ABNORMAL HIGH (ref 70–99)
Potassium: 3.9 mmol/L (ref 3.5–5.2)
Sodium: 142 mmol/L (ref 134–144)
Total Protein: 6.7 g/dL (ref 6.0–8.5)
eGFR: 75 mL/min/{1.73_m2} (ref >59)

## 2023-08-19 LAB — CBC
Hematocrit: 39.7 % (ref 34.0–46.6)
Hemoglobin: 13.1 g/dL (ref 11.1–15.9)
MCH: 30.8 pg (ref 26.6–33.0)
MCHC: 33 g/dL (ref 31.5–35.7)
MCV: 93 fL (ref 79–97)
Platelets: 188 10*3/uL (ref 150–450)
RBC: 4.26 x10E6/uL (ref 3.77–5.28)
RDW: 12.6 % (ref 11.7–15.4)
WBC: 3.6 10*3/uL (ref 3.4–10.8)

## 2023-08-19 LAB — LIPID PANEL
Chol/HDL Ratio: 4.2 ratio (ref 0.0–4.4)
Cholesterol, Total: 205 mg/dL — ABNORMAL HIGH (ref 100–199)
HDL: 49 mg/dL (ref >39)
LDL Chol Calc (NIH): 142 mg/dL — ABNORMAL HIGH (ref 0–99)
Triglycerides: 76 mg/dL (ref 0–149)
VLDL Cholesterol Cal: 14 mg/dL (ref 5–40)

## 2023-08-19 LAB — VITAMIN D 25 HYDROXY (VIT D DEFICIENCY, FRACTURES): Vit D, 25-Hydroxy: 34.4 ng/mL (ref 30.0–100.0)

## 2023-08-19 LAB — TSH: TSH: 1.61 u[IU]/mL (ref 0.450–4.500)

## 2023-08-19 LAB — HEMOGLOBIN A1C
Est. average glucose Bld gHb Est-mCnc: 128 mg/dL
Hgb A1c MFr Bld: 6.1 % — ABNORMAL HIGH (ref 4.8–5.6)

## 2023-08-26 ENCOUNTER — Ambulatory Visit (HOSPITAL_COMMUNITY)
Admission: RE | Admit: 2023-08-26 | Discharge: 2023-08-26 | Disposition: A | Discharge status: Home / Self Care | Payer: No Typology Code available for payment source | Source: Ambulatory Visit | Attending: Family Medicine | Admitting: Family Medicine

## 2023-08-26 DIAGNOSIS — J449 Chronic obstructive pulmonary disease, unspecified: Secondary | ICD-10-CM | POA: Diagnosis not present

## 2023-08-26 DIAGNOSIS — E785 Hyperlipidemia, unspecified: Secondary | ICD-10-CM | POA: Diagnosis not present

## 2023-08-26 DIAGNOSIS — R55 Syncope and collapse: Secondary | ICD-10-CM | POA: Diagnosis not present

## 2023-08-26 DIAGNOSIS — I10 Essential (primary) hypertension: Secondary | ICD-10-CM | POA: Insufficient documentation

## 2023-08-26 LAB — ECHOCARDIOGRAM COMPLETE
Area-P 1/2: 3.27 cm2
Calc EF: 55.2 %
S' Lateral: 2.7 cm
Single Plane A2C EF: 56.5 %
Single Plane A4C EF: 56.3 %

## 2023-08-28 ENCOUNTER — Ambulatory Visit: Payer: No Typology Code available for payment source | Attending: Family Medicine

## 2023-08-28 ENCOUNTER — Other Ambulatory Visit: Payer: Self-pay | Admitting: Family Medicine

## 2023-08-28 DIAGNOSIS — R55 Syncope and collapse: Secondary | ICD-10-CM

## 2023-08-28 NOTE — Progress Notes (Unsigned)
EP to read

## 2023-08-31 ENCOUNTER — Other Ambulatory Visit: Payer: Self-pay | Admitting: Student

## 2023-08-31 DIAGNOSIS — R55 Syncope and collapse: Secondary | ICD-10-CM

## 2023-08-31 DIAGNOSIS — J4489 Other specified chronic obstructive pulmonary disease: Secondary | ICD-10-CM

## 2023-10-09 DIAGNOSIS — R55 Syncope and collapse: Secondary | ICD-10-CM | POA: Diagnosis not present

## 2023-10-19 NOTE — Progress Notes (Signed)
Called patient LVM on daughter VM to contact the office

## 2023-10-22 ENCOUNTER — Telehealth: Payer: Self-pay | Admitting: *Deleted

## 2023-10-22 ENCOUNTER — Other Ambulatory Visit: Payer: Self-pay | Admitting: Family Medicine

## 2023-10-22 DIAGNOSIS — I4719 Other supraventricular tachycardia: Secondary | ICD-10-CM

## 2023-10-22 DIAGNOSIS — R55 Syncope and collapse: Secondary | ICD-10-CM

## 2023-10-22 NOTE — Progress Notes (Signed)
Referral placed.

## 2023-10-22 NOTE — Telephone Encounter (Signed)
Please advise if there is anything you can do for this or some suggestions we can give patient.      Copied from CRM [id number]. Topic: Clinical - Medication Question >> Oct 22, 2023 10:25 AM Carlatta H wrote: Reason for CRM: Patient has laryngitis and she cannot speak//She would like to know if Dr Constance Goltz can prescribe her some antibiotics//Please call [phone]

## 2023-10-23 NOTE — Telephone Encounter (Signed)
Pt informed of below.  

## 2023-10-23 NOTE — Telephone Encounter (Signed)
Please let the patient know that laryngitis is mostly caused by viruses and antibiotics won't help.  She should avoid straining her voice by talking as little as possible while she recovers.  She should drink plenty of fluids and chew gum to help keep the vocal cords from getting dry and irritated.    If she develops fever for more than one day she should let us know.  If she has trouble swallowing or feels like she is having difficulty breathing she should go to the emergency department.  If symptoms aren't improving after a week she should schedule an appt with [place].

## 2023-10-30 ENCOUNTER — Encounter: Payer: Self-pay | Admitting: Family Medicine

## 2023-10-30 ENCOUNTER — Ambulatory Visit (INDEPENDENT_AMBULATORY_CARE_PROVIDER_SITE_OTHER): Payer: No Typology Code available for payment source | Admitting: Family Medicine

## 2023-10-30 VITALS — BP 115/74 | HR 70 | Ht 65.0 in | Wt 188.8 lb

## 2023-10-30 DIAGNOSIS — J159 Unspecified bacterial pneumonia: Secondary | ICD-10-CM | POA: Diagnosis not present

## 2023-10-30 MED ORDER — CEFDINIR 300 MG PO CAPS
300.0000 mg | ORAL_CAPSULE | Freq: Two times a day (BID) | ORAL | 0 refills | Status: AC
Start: 2023-10-30 — End: 2023-11-04

## 2023-10-30 MED ORDER — AZITHROMYCIN 500 MG PO TABS: 500.0000 mg | ORAL_TABLET | Freq: Every day | ORAL | 0 refills | Status: AC

## 2023-10-30 NOTE — Assessment & Plan Note (Signed)
 2 weeks of productive cough with weakness and shortness of breath.  O2 sats normal.  Exam was normal with exception of some decreased air movement in the right lower base.  Will prescribe antibiotics for presumed bacterial pneumonia given patient's age and other risk factors.  Recommended continue taking guaifenesin  for cough. - Cefdinir  and azithromycin  x 5 days.

## 2023-10-30 NOTE — Patient Instructions (Signed)
 It was nice to see you today,  We addressed the following topics today: -I have prescribed 5 days of antibiotics for you to take.  This is cefdinir  twice a day and azithromycin  once a day. - If you feel like you are not improving after completing the course let us  know - Continue to take Mucinex  as needed for cough.  You can take Mucinex  12-hour twice a day.  Have a great day,  Rolan Slain, MD

## 2023-10-30 NOTE — Progress Notes (Signed)
   Acute Office Visit  Subjective:     Patient ID: Andrea Buck, female    DOB: 17-Oct-1946, 78 y.o.   MRN: [id number]  Chief Complaint  Patient presents with   Cough    HPI Patient is in today for cough and congestion.  Patient states she has had symptoms for at least 2 weeks.  This includes cough that was initially tried but after she was started taking Mucinex  she was producing greenish-gray sputum.  Also with weakness, decreased appetite, nasal congestion, headache with prolonged coughing, and dyspnea on exertion.  Has not taken her temperature at all.  Has a history of getting respiratory infections this time of year that eventually required antibiotics. ROS      Objective:    BP 115/74   Pulse 70   Ht 5' 5 (1.651 m)   Wt 188 lb 12.8 oz (85.6 kg)   SpO2 98%   BMI 31.42 kg/m    Physical Exam General: Alert, oriented CV: Regular rhythm Pulmonary: No wheezing.  Decreased air movement in right lower lung.  No results found for any visits on 10/30/23.      Assessment & Plan:   Community acquired bacterial pneumonia Assessment & Plan: 2 weeks of productive cough with weakness and shortness of breath.  O2 sats normal.  Exam was normal with exception of some decreased air movement in the right lower base.  Will prescribe antibiotics for presumed bacterial pneumonia given patient's age and other risk factors.  Recommended continue taking guaifenesin  for cough. - Cefdinir  and azithromycin  x 5 days.   Other orders -     Cefdinir ; Take 1 capsule (300 mg total) by mouth 2 (two) times daily for 5 days.  Dispense: 10 capsule; Refill: 0 -     Azithromycin ; Take 1 tablet (500 mg total) by mouth daily for 5 days.  Dispense: 5 tablet; Refill: 0     Return if symptoms worsen or fail to improve.  Toribio MARLA Slain, MD

## 2023-11-04 ENCOUNTER — Other Ambulatory Visit: Payer: Self-pay | Admitting: Family Medicine

## 2023-11-04 ENCOUNTER — Other Ambulatory Visit: Payer: Self-pay | Admitting: Student

## 2023-11-04 DIAGNOSIS — J4489 Other specified chronic obstructive pulmonary disease: Secondary | ICD-10-CM

## 2023-11-10 ENCOUNTER — Encounter: Payer: Self-pay | Admitting: Gastroenterology

## 2023-11-18 ENCOUNTER — Ambulatory Visit (INDEPENDENT_AMBULATORY_CARE_PROVIDER_SITE_OTHER): Payer: No Typology Code available for payment source | Admitting: Family Medicine

## 2023-11-18 ENCOUNTER — Encounter: Payer: Self-pay | Admitting: Family Medicine

## 2023-11-18 VITALS — BP 150/80 | HR 72 | Ht 65.0 in | Wt 185.4 lb

## 2023-11-18 DIAGNOSIS — E559 Vitamin D deficiency, unspecified: Secondary | ICD-10-CM | POA: Diagnosis not present

## 2023-11-18 DIAGNOSIS — M85859 Other specified disorders of bone density and structure, unspecified thigh: Secondary | ICD-10-CM

## 2023-11-18 DIAGNOSIS — E785 Hyperlipidemia, unspecified: Secondary | ICD-10-CM | POA: Diagnosis not present

## 2023-11-18 DIAGNOSIS — R55 Syncope and collapse: Secondary | ICD-10-CM | POA: Diagnosis not present

## 2023-11-18 DIAGNOSIS — M25551 Pain in right hip: Secondary | ICD-10-CM | POA: Diagnosis not present

## 2023-11-18 DIAGNOSIS — J3800 Paralysis of vocal cords and larynx, unspecified: Secondary | ICD-10-CM

## 2023-11-18 DIAGNOSIS — M25559 Pain in unspecified hip: Secondary | ICD-10-CM | POA: Insufficient documentation

## 2023-11-18 MED ORDER — ROSUVASTATIN CALCIUM 40 MG PO TABS: 40.0000 mg | ORAL_TABLET | Freq: Every day | ORAL | 1 refills | Status: DC

## 2023-11-18 MED ORDER — CALCIUM CITRATE-VITAMIN D 500-12.5 MG-MCG PO CHEW: 2.0000 | CHEWABLE_TABLET | Freq: Every day | ORAL | 2 refills | Status: DC

## 2023-11-18 MED ORDER — MELOXICAM 15 MG PO TABS: 15.0000 mg | ORAL_TABLET | Freq: Every day | ORAL | 1 refills | Status: DC

## 2023-11-18 NOTE — Progress Notes (Signed)
Established Patient Office Visit  Subjective   Patient ID: Andrea Buck, female    DOB: [birth year]  Age: 78 y.o. MRN: [id number]  Chief Complaint  Patient presents with   Medical Management of Chronic Issues    HPI  Patient states that she has had trouble getting some of her medications.  She mentions meloxicam but is unable to think of other medications at this time.  She has not been taking vitamin D since we recommended switching from weekly to daily vitamin D.  Patient has been taking the 40 mg of Crestor.  She was doubling her dose of 20 mg Crestor after we asked her to.  States her last prescription was for 40 mg, but I do not see any record of her being prescribed a 40 mg tablet.  Syncope-patient has not had any more syncopal episodes or presyncopal episodes.  She states she has not heard from anybody from cardiology yet.  Vocal cord dysfunction-patient goes to Dr. Delford Field with [facility] yearly.  She does not want to have anymore procedures on her vocal cords at this time due to concerns abou getting general anesthesia.  She would like to go to somebody closer for local rehabilitation.  Does not like having to drive to [facility] for this.  Patient has occasional pain radiating down the right hip to the foot.  Believes it occurred after she fell right before Christmas.  Notices the pain is worse when she bends over.  Puts heating pad on it and takes Tylenol as needed.   The 10-year ASCVD risk score (Arnett DK, et al., 2019) is: 20.2%  Health Maintenance Due  Topic Date Due   DTaP/Tdap/Td (2 - Tdap) 05/27/2009   Colonoscopy  07/28/2021   Medicare Annual Wellness (AWV)  12/17/2022   INFLUENZA VACCINE  05/28/2023   COVID-19 Vaccine (7 - 2024-25 season) 06/28/2023      Objective:     BP (!) 150/80   Pulse 72   Ht 5\' 5"  (1.651 m)   Wt 185 lb 6.4 oz (84.1 kg)   SpO2 97%   BMI 30.85 kg/m    Physical Exam General: Alert, oriented HEENT: Patient's voice is  strained, consistent with previous exams CV: Regular in rhythm no murmurs Pulmonary: Lungs clear bilaterally MSK: Normal gait.  No results found for any visits on 11/18/23.      Assessment & Plan:   Syncope, unspecified syncope type Assessment & Plan: No more syncopal episodes since our last visit.  Has not heard from cardiology regarding her palpitations/syncope.  They have tried to call her twice.  Will give the patient contact info so that she can call the office to schedule   Dyslipidemia Assessment & Plan: Rechecking cholesterol level today.  Has been taking double of the 20s of Crestor.  Will send a new prescription for 40 mg Crestor.  Orders: -     Rosuvastatin Calcium; Take 1 tablet (40 mg total) by mouth daily.  Dispense: 90 tablet; Refill: 1 -     Lipid panel  Vitamin D deficiency Assessment & Plan: Switching from weekly vitamin D to daily calcium/vitamin D I will wait  Orders: -     Calcium Citrate-Vitamin D; Chew 2 tablets by mouth daily.  Dispense: 100 tablet; Refill: 2  Osteopenia of neck of femur, unspecified laterality Assessment & Plan: Sending in prescription for daily vitamin D/calcium.  Recheck vitamin D level in 3 months.  Orders: -     Calcium Citrate-Vitamin D;  Chew 2 tablets by mouth daily.  Dispense: 100 tablet; Refill: 2  Vocal cord paralysis Assessment & Plan: Continues to see Dr. Delford Field.  Does not like the long distance she has to travel.  Has been recommended to do local therapy but the long distance required to drive to these appointments as prevented her from pursuing this.  Advised her I can see if we can send in a referral to someone closer who specializes in laryngology such as Dr. Irene Pap.    Orders: -     Ambulatory referral to ENT  Pain of right hip Assessment & Plan: [name] prior to Christmas.  Having episodes of pain rating down right hip to foot.  Uses heating pad and Tylenol as needed.  Gait is normal, fracture unlikely.  SI  joint dysfunction/arthritis more likely.  Refilling her meloxicam today.  Continue conservative management.   Other orders -     Meloxicam; Take 1 tablet (15 mg total) by mouth daily.  Dispense: 30 tablet; Refill: 1     Return in about 3 months (around 02/16/2024) for hld.    Andrea Kitty, MD

## 2023-11-18 NOTE — Assessment & Plan Note (Signed)
Rechecking cholesterol level today.  Has been taking double of the 20s of Crestor.  Will send a new prescription for 40 mg Crestor.

## 2023-11-18 NOTE — Assessment & Plan Note (Signed)
Sending in prescription for daily vitamin D/calcium.  Recheck vitamin D level in 3 months.

## 2023-11-18 NOTE — Assessment & Plan Note (Signed)
[name] prior to Christmas.  Having episodes of pain rating down right hip to foot.  Uses heating pad and Tylenol as needed.  Gait is normal, fracture unlikely.  SI joint dysfunction/arthritis more likely.  Refilling her meloxicam today.  Continue conservative management.

## 2023-11-18 NOTE — Assessment & Plan Note (Signed)
Switching from weekly vitamin D to daily calcium/vitamin D I will wait

## 2023-11-18 NOTE — Assessment & Plan Note (Signed)
No more syncopal episodes since our last visit.  Has not heard from cardiology regarding her palpitations/syncope.  They have tried to call her twice.  Will give the patient contact info so that she can call the office to schedule

## 2023-11-18 NOTE — Assessment & Plan Note (Signed)
Continues to see Dr. Delford Field.  Does not like the long distance she has to travel.  Has been recommended to do local therapy but the long distance required to drive to these appointments as prevented her from pursuing this.  Advised her I can see if we can send in a referral to someone closer who specializes in laryngology such as Dr. Irene Pap.

## 2023-11-18 NOTE — Patient Instructions (Signed)
It was nice to see you today,  We addressed the following topics today: -I have sent in a new prescription for meloxicam.  I have sent in a prescription for daily vitamin D but this is over-the-counter and they may make you by the over-the-counter option. - I will look into local ENT specialist who focused on the vocal cord issues you have - I have sent in a prescription for a higher dose of the Crestor.  You will be taking 40 mg of Crestor daily.  Have a great day,  Frederic Jericho, MD

## 2023-11-19 ENCOUNTER — Encounter: Payer: Self-pay | Admitting: Family Medicine

## 2023-11-19 LAB — LIPID PANEL
Chol/HDL Ratio: 4.8 ratio — ABNORMAL HIGH (ref 0.0–4.4)
Cholesterol, Total: 242 mg/dL — ABNORMAL HIGH (ref 100–199)
HDL: 50 mg/dL (ref >39)
LDL Chol Calc (NIH): 181 mg/dL — ABNORMAL HIGH (ref 0–99)
Triglycerides: 67 mg/dL (ref 0–149)
VLDL Cholesterol Cal: 11 mg/dL (ref 5–40)

## 2023-11-24 ENCOUNTER — Ambulatory Visit: Payer: No Typology Code available for payment source | Admitting: Gastroenterology

## 2023-12-08 ENCOUNTER — Other Ambulatory Visit: Payer: Self-pay | Admitting: Family Medicine

## 2023-12-08 DIAGNOSIS — J301 Allergic rhinitis due to pollen: Secondary | ICD-10-CM

## 2023-12-18 ENCOUNTER — Ambulatory Visit: Payer: No Typology Code available for payment source | Admitting: Family Medicine

## 2023-12-22 ENCOUNTER — Ambulatory Visit: Payer: No Typology Code available for payment source | Admitting: Gastroenterology

## 2023-12-23 ENCOUNTER — Ambulatory Visit (INDEPENDENT_AMBULATORY_CARE_PROVIDER_SITE_OTHER): Payer: No Typology Code available for payment source | Admitting: Family Medicine

## 2023-12-23 ENCOUNTER — Encounter: Payer: Self-pay | Admitting: Family Medicine

## 2023-12-23 VITALS — BP 117/72 | HR 66 | Ht 65.0 in | Wt 188.0 lb

## 2023-12-23 DIAGNOSIS — H259 Unspecified age-related cataract: Secondary | ICD-10-CM | POA: Diagnosis not present

## 2023-12-23 DIAGNOSIS — F331 Major depressive disorder, recurrent, moderate: Secondary | ICD-10-CM | POA: Diagnosis not present

## 2023-12-23 DIAGNOSIS — E785 Hyperlipidemia, unspecified: Secondary | ICD-10-CM

## 2023-12-23 MED ORDER — SERTRALINE HCL 100 MG PO TABS: 150.0000 mg | ORAL_TABLET | Freq: Every day | ORAL | 1 refills | Status: DC

## 2023-12-23 NOTE — Progress Notes (Signed)
   Established Patient Office Visit  Subjective   Patient ID: BAYLEY YARBOROUGH, female    DOB: 04/15/1946  Age: 78 y.o. MRN: [id number]  Chief Complaint  Patient presents with   Medical Management of Chronic Issues    HPI  Patient brought in her medications today so that we can go over them.  She recently increased her rosuvastatin and cholesterol level subsequently worsened  Patient had 3 bottles of rosuvastatin 40 mg as well as a bottle of 20 and a bottle of 10 mg.  We removed the old bottles and disposed of them.  She also had an extra bottle of clonidine that we combined into 1.  Patient would like a refill of her Zoloft.  She has not been taking it for a few months but prior to that was taking 1-1/2 tablets a day.  Feels like she is more stressed out now than before.  Patient had a YAG cataract procedure performed by Dr. Valere Dross.  She was subsequently referred to a another provider for issues with lacrimal duct clogging.  Her insurance did not cover that provider and she wants to know if she can be referred to somebody else.   The 10-year ASCVD risk score (Arnett DK, et al., 2019) is: 17.7%  Health Maintenance Due  Topic Date Due   DTaP/Tdap/Td (2 - Tdap) 05/27/2009   Colonoscopy  07/28/2021   Medicare Annual Wellness (AWV)  12/17/2022   INFLUENZA VACCINE  05/28/2023   COVID-19 Vaccine (7 - 2024-25 season) 06/28/2023      Objective:     BP 117/72   Pulse 66   Ht 5\' 5"  (1.651 m)   Wt 188 lb (85.3 kg)   SpO2 99%   BMI 31.28 kg/m    Physical Exam General: Alert, oriented Pulmonary: No respiratory stress Psych: Pleasant affect   No results found for any visits on 12/23/23.      Assessment & Plan:   Dyslipidemia Assessment & Plan: Patient brought her medications.  We removed the 10 mg and 20 mg tablets and dispose of them.  She has 3 bottles of 40 mg tablets.  Will recheck her cholesterol again in 2 months.   Moderate episode of recurrent major  depressive disorder [facility]) Assessment & Plan: Sending in refill of her Zoloft.  She has not been on it for a few months therefore advised her to take a half a tablet at first and titrate up to the 1-1/2 tablets.  Orders: -     Sertraline HCl; Take 1.5 tablets (150 mg total) by mouth at bedtime.  Dispense: 135 tablet; Refill: 1  Age-related cataract of both eyes, unspecified age-related cataract type Assessment & Plan: Had YAG procedure with Dr. Tomi Bamberger.  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  Was sent to [facility]? Plastic surgery? (Uncertain if this is the practice pt is referring to) and was told by insurance it was not covered at first.  Advised pt to find out from her insurance who is covered and then she can reach out to her ophthalmologist for referral to new provider.        Return in about 3 months (around 03/21/2024).    [name], MD

## 2023-12-23 NOTE — Assessment & Plan Note (Signed)
 Sending in refill of her Zoloft.  She has not been on it for a few months therefore advised her to take a half a tablet at first and titrate up to the 1-1/2 tablets.

## 2023-12-23 NOTE — Patient Instructions (Addendum)
 It was nice to see you today,  We addressed the following topics today: -I have resent your Zoloft.  I would like you to start with just a half a tablet for 2 weeks and then you can increase to 1 tablet for another 2 weeks.  After that if you would like to go back to your 1-1/2 tablet dose you can.  Or stay at 1 tablet. - I have taken out all the old medications.  The dose of rosuvastatin that is currently in there is your current dose. - You have appointments upcoming on March 24 with the ENT doctor and March 25 with the cardiologist. - Please call your insurance company to find out more information about your debit card and also if they can tell you which doctor is covered for the eye procedure you are talking about.  Then your ophthalmologist can send in a referral.  Have a great day,  Frederic Jericho, MD

## 2023-12-23 NOTE — Assessment & Plan Note (Signed)
 Had YAG procedure with Dr. Tomi Bamberger.  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  Was sent to [facility]? Plastic surgery? (Uncertain if this is the practice pt is referring to) and was told by insurance it was not covered at first.  Advised pt to find out from her insurance who is covered and then she can reach out to her ophthalmologist for referral to new provider.

## 2023-12-23 NOTE — Assessment & Plan Note (Signed)
 Patient brought her medications.  We removed the 10 mg and 20 mg tablets and dispose of them.  She has 3 bottles of 40 mg tablets.  Will recheck her cholesterol again in 2 months.

## 2024-01-12 DIAGNOSIS — N3946 Mixed incontinence: Secondary | ICD-10-CM | POA: Diagnosis not present

## 2024-01-12 DIAGNOSIS — R350 Frequency of micturition: Secondary | ICD-10-CM | POA: Diagnosis not present

## 2024-01-18 ENCOUNTER — Ambulatory Visit (INDEPENDENT_AMBULATORY_CARE_PROVIDER_SITE_OTHER): Payer: No Typology Code available for payment source | Admitting: Otolaryngology

## 2024-01-18 ENCOUNTER — Encounter (INDEPENDENT_AMBULATORY_CARE_PROVIDER_SITE_OTHER): Payer: Self-pay | Admitting: Otolaryngology

## 2024-01-18 VITALS — BP 167/83 | HR 66 | Ht 65.0 in | Wt 188.0 lb

## 2024-01-18 DIAGNOSIS — R490 Dysphonia: Secondary | ICD-10-CM

## 2024-01-18 DIAGNOSIS — R0982 Postnasal drip: Secondary | ICD-10-CM

## 2024-01-18 DIAGNOSIS — J3801 Paralysis of vocal cords and larynx, unilateral: Secondary | ICD-10-CM

## 2024-01-18 DIAGNOSIS — R0981 Nasal congestion: Secondary | ICD-10-CM

## 2024-01-18 DIAGNOSIS — R1312 Dysphagia, oropharyngeal phase: Secondary | ICD-10-CM

## 2024-01-18 DIAGNOSIS — G510 Bell's palsy: Secondary | ICD-10-CM

## 2024-01-18 DIAGNOSIS — J3089 Other allergic rhinitis: Secondary | ICD-10-CM | POA: Diagnosis not present

## 2024-01-18 DIAGNOSIS — K148 Other diseases of tongue: Secondary | ICD-10-CM

## 2024-01-18 DIAGNOSIS — G522 Disorders of vagus nerve: Secondary | ICD-10-CM

## 2024-01-18 MED ORDER — LEVOCETIRIZINE DIHYDROCHLORIDE 5 MG PO TABS: 5.0000 mg | ORAL_TABLET | Freq: Every evening | ORAL | 6 refills | Status: DC

## 2024-01-18 MED ORDER — FLUTICASONE PROPIONATE 50 MCG/ACT NA SUSP: 2.0000 | Freq: Two times a day (BID) | NASAL | 6 refills | Status: DC

## 2024-01-18 NOTE — Patient Instructions (Addendum)
 HYALURONIC ACID (RESTYLANE) INJECTION TO THE VOCAL CORD What is Hyaluronic Acid? Hyaluronic Acid is a synthetic substance. It contains a substance that is found in our subcutaneous tissue, or the soft tissue just beneath our skin. It has also been used in plastic surgery to fill in wrinkles or plump lips.    Hyaluronic Acid and the Vocal Cords Vocal cord problems are usually due to a lack of movement, bowing (loss of muscle) or weakness of the vocal cords. This lack of motion makes speech difficult. A lack of vocal cord motion can also cause:  Low, raspy or rough voice  Constant hoarseness  Breathing difficulty  Inability to speak above a whisper  Coughing or choking when swallowing Hyaluronic Acid increases the size or mass of the vocal cord where muscle loss has occurred. It acts like a space filler. Hyaluronic Acid is injected next to the vocal cords and into the muscles supporting the vocal cords. The body slowly reabsorbs it over time, and its effects usually last about 4-6 months, although this range varies for each patient.  Hyaluronic Acid Treatment You will be awake during the treatment and able to talk and breathe normally. Numbing medicines are used before the procedure to decrease the discomfort. The treatment will take about 5-10 minutes.    You should not eat a big meal before the injection.  In the office: You will sit up in a chair for the injection. A fiberoptic camera called a nasoendoscope is placed in a nostril and will go down to the base of your throat. This will allow the doctor to see your vocal cords. An injection of Hyaluronic Acid can be given in several different ways, including via your mouth, directly into the surface of the vocal cord, or through the skin near your "Adam's apple" on your throat. If it is given through the skin, the lidocaine is injected into the skin to numb it before the injection.  If it is given via the mouth, the lining of the throat is numbed  with liquid lidocaine first. We often offer a dose of valium to be taken before the procedure if you have someone who can drive you home afterward. Call the office if you would like this prescribed for you, then pick it up at your pharmacy and take it 1 hour before the procedure. Many patients choose not to take the valium, and they are able to drive themselves to and from the appointment.  In the operating room: You will be asleep under general anesthesia, with a breathing tube in. The procedure takes approximately 10 minutes and is done in a hospital or surgery center.  Risks include (but are not limited to) swelling, infection or allergic reaction, pain, extravasation into a more superficial layer of the vocal cord (making the voice more strained), inflammation, or failure to improve the voice. Rarely, additional procedures are needed to address these complications.  The voice is usually strained for a few days, then settles in by 4-7 days. Some patients may sound very good after the injection but find that their voice fades quickly, much earlier than the usual 4-6 months. These patients may benefit from a second injection right away, and they should call the office.  If you have any trouble breathing or severe pain with swallowing or talking, you should call the office right away. It is not unusual to have a sore throat for a few days afterward, but if this soreness or pain is severe, you should call  the office. You should always call the office for any questions or issues.  After a Hyaluronic Acid Treatment  You can talk right after your treatment.  If you had the procedure done in the office, you should not eat or drink for 1 hour, or until the sensation in your throat completely returns to normal  You may have some discomfort or pain that radiates from your throat to your ear.  You will follow up with your doctor as instructed.  The Hyaluronic Acid procedure results can last from 3-6 months, but  this is different for every patient.     [email or web address] - check out this website to learn more about reflux   -Avoid lying down for at least two hours after a meal or after drinking acidic beverages, like soda, or other caffeinated beverages. This can help to prevent stomach contents from flowing back into the esophagus. -Keep your head elevated while you sleep. Using an extra pillow or two can also help to prevent reflux. -Eat smaller and more frequent meals each day instead of a few large meals. This promotes digestion and can aid in preventing heartburn. -Wear loose-fitting clothes to ease pressure on the stomach, which can worsen heartburn and reflux. -Reduce excess weight around the midsection. This can ease pressure on the stomach. Such pressure can force some stomach contents back up the esophagus - Take Reflux Gourmet (natural supplement available on Amazon) to help with symptoms of chronic throat irritation

## 2024-01-18 NOTE — Progress Notes (Signed)
 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  She has a history of vocal cord paralysis/palate and pharyngeal weakness following surgery for a R glomus tumor approximately 10 to 12 years ago at [facility], which resulted in high-vagal injury. This led to R vocal cord paralysis/soft palate/pharyngeal weakness and slight facial drooping. No feeding tube was required post-surgery, contrary to initial predictions.  She has undergone multiple procedures to address her vocal cord issues, including a Restylane injection in 2013, Prolaren Plus in 2016, and a fat injection in 2024. The fat injection provided more prolonged improvement compared to previous treatments, which caused voice fluctuations and respiratory discomfort.  Currently, she experiences frequent vocal fatigue, coughing when drinking liquids, and accumulation of secretions. Her voice has declined over time, and she has been experiencing unexpected difficulties with swallowing, particularly when not sitting upright. No history of stroke or heart attack and not on blood thinners.  She is currently taking medications for reflux, including pantoprazole and Pepsid. She also reports issues with nasal drainage and has not been using any medication for it.  Records Reviewed:  11/18/23 PCP office visit  Patient states that she has had trouble getting some of her medications.  She mentions meloxicam but is unable to think of other medications at this time.   She has not been taking vitamin D since we  recommended switching from weekly to daily vitamin D.   Patient has been taking the 40 mg of Crestor.  She was doubling her dose of 20 mg Crestor after we asked her to.  States her last prescription was for 40 mg, but I do not see any record of her being prescribed a 40 mg tablet.   Syncope-patient has not had any more syncopal episodes or presyncopal episodes.  She states she has not heard from anybody from cardiology yet.   Vocal cord dysfunction-patient goes to Dr. Delford Field with [facility] yearly.  She does not want to have anymore procedures on her vocal cords at this time due to concerns about getting general anesthesia.  She would like to go to somebody closer for local rehabilitation.  Does not like having to drive to [facility] for this.   Patient has occasional pain radiating down the right hip to the foot.  Believes it occurred after she fell right before Christmas.  Notices the pain is worse when she bends over.  Puts heating pad on it and takes Tylenol as needed.  Dr Delford Field [facility]  Associated Order(s): Laryngoscopy Flexible Diagnostic Post-Procedure Diagnose(s): Dysphagia, oropharyngeal phase; Paralysis of vocal cords and larynx, unilateral Formatting of this note is different from the original. 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 Lendell Caprice (2005). I injected her vocal folds in 2013 (Restylane) and in 2016 (Prolaryn Plus). In 2022 she developed worsening dysphonia and vocal fatigue - a problem with her geriatric patients and her dysarthria.  I took her for an injection  augmentation using autologous fat in March 2022 (Dedo, straightforward, intraop VF hematoma)  Since the last clinic visit: She mentions that her voice has slowly been worsening for the last year. She also has had a  challenging few years her sister has a stroke about 8 months ago and she has been in [facility] taking care of her. About 1 year ago her grandson passed. She presents today for her annual checkup and to see if anything can be done to improve her voice. She notes that she is very quiet and does not have enough vocal power.     Past Medical History:  Diagnosis Date   Acute cystitis with hematuria 07/22/2018   Anxiety and depression 09/06/2018   Blood transfusion    after hysterectomy   Bronchitis, chronic (HCC) 2005   Carpal tunnel syndrome 12/24/2006   Qualifier: Diagnosis of  By: Dewitt Rota     CHOLELITHIASIS 04/25/2009   Qualifier: Diagnosis of  By: Karn Pickler MD, Jessica     COPD (chronic obstructive pulmonary disease) (HCC)    Depression    GASTROESOPHAGEAL REFLUX, NO ESOPHAGITIS 12/24/2006   Qualifier: Diagnosis of  By: Willow Ora TUMOR 2001   Qualifier: Diagnosis of  By: Karn Pickler MD, Jessica     Hyperlipidemia    Hypertension    MENOPAUSAL SYNDROME 12/24/2006   Qualifier: Diagnosis of  By: Dewitt Rota     NECK MASS 12/29/2006   Qualifier: Diagnosis of  By: Christell Constant CMA, [place]     Sciatica 02/10/2017   Screening for hypothyroidism 07/21/2017   Vocal cord paralysis 05/26/2011   RIGHT vocal cord parlaysis Seen by Dr,. Rayford Halsted at [place].( May 03, 2011)  Placed on scopalamine patch and w f/u [facility]     Past Surgical History:  Procedure Laterality Date   ABDOMINAL HYSTERECTOMY     CARPAL TUNNEL RELEASE     CHOLECYSTECTOMY     LUMBAR LAMINECTOMY     (4-5)   palatal adhesion     removal of glomus vagales tumor-non cancerious     TUBAL LIGATION     UPPER GASTROINTESTINAL ENDOSCOPY  2006    Family History  Problem Relation Age of Onset   Cancer Mother        Uterus   Diabetes Mother    Hypertension Mother    Heart disease Father    Stroke Father    Hypertension Father    Diabetes Father    Obesity Sister    Diabetes Sister     Hypertension Sister    Diabetes Sister    Hypertension Sister    Diabetes Sister    Hypertension Sister    Diabetes Brother    Multiple sclerosis Brother    Diabetes Brother    Hypertension Brother    Mental illness Son        PTSD   Diabetes Son    Hypertension Son    Hyperlipidemia Son    Obesity Son    Drug abuse Son    Arthritis Son    Drug abuse Son    Hyperlipidemia Son    Colon cancer Neg Hx    Colon polyps Neg Hx    Stomach cancer Neg Hx    Esophageal cancer Neg Hx    Rectal cancer Neg Hx     Social History:  reports that she has never smoked. She has never been exposed to tobacco smoke. She  has never used smokeless tobacco. She reports that she does not drink alcohol and does not use drugs.  Allergies: No Known Allergies  Medications: I have reviewed the patient's current medications.  The PMH, PSH, Medications, Allergies, and SH were reviewed and updated.  ROS: Constitutional: Negative for fever, weight loss and weight gain. Cardiovascular: Negative for chest pain and dyspnea on exertion. Respiratory: Is not experiencing shortness of breath at rest. Gastrointestinal: Negative for nausea and vomiting. Neurological: Negative for headaches. Psychiatric: The patient is not nervous/anxious  Blood pressure (!) 167/83, pulse 66, height 5\' 5"  (1.651 m), weight 188 lb (85.3 kg), SpO2 98%. Body mass index is 31.28 kg/m.  PHYSICAL EXAM:  Exam: General: Well-developed, well-nourished Communication and Voice: raspy slightly breathy at times Respiratory Respiratory effort: Equal inspiration and expiration without stridor Cardiovascular Peripheral Vascular: Warm extremities with equal color/perfusion Eyes: No nystagmus with equal extraocular motion bilaterally Neuro/Psych/Balance: Patient oriented to person, place, and time; Appropriate mood and affect; Gait is intact with no imbalance; Cranial nerves with evidence decreased soft palate movement pharyngeal  movement on the right, R Facial weakness (HB 2) and R VF paralysis on scope exam R CN XII weakness as well with accompanying tongue atrophy on the right side  Inspection: Normocephalic and atraumatic without mass or lesion Palpation: Facial skeleton intact without bony stepoffs Salivary Glands: No mass or tenderness Facial Strength: Facial motility with mild weakness on the right side HB 2 ENT Pinna: External ear intact and fully developed External canal: Canal is patent with intact skin Tympanic Membrane: Clear and mobile External Nose: No scar or anatomic deformity Internal Nose: Septum is deviated to the left. No polyp, or purulence. Mucosal edema and erythema present.  Bilateral inferior turbinate hypertrophy.  Lips, Teeth, and gums: Mucosa and teeth intact and viable TMJ: No pain to palpation with full mobility Oral cavity/oropharynx: No erythema or exudate, no lesions present Nasopharynx: No mass or lesion with intact mucosa Hypopharynx: Intact mucosa without pooling of secretions Larynx Glottic: Full true vocal cord mobility on the left, right VF paralysis on the right, without lesion or mass There appears to be evidence of incomplete closure with adduction but edges of both VF appear straight and there seems to be evidence of infraglottic augmentation noted on the left side could be from previous fat graft  Supraglottic: AE fold collapse on the right side likely from vagal injury clear secretions scant in hypopharynx/pyriforms R > L Interarytenoid Space: Moderate pachydermia&edema Subglottic Space: Not seen well  Neck Neck and Trachea: Midline trachea without mass or lesion Thyroid: No mass or nodularity Lymphatics: No lymphadenopathy  Procedure: Preoperative diagnosis: dysphonia/dysphagia hx of VF paralysis on the right and right glomus tumor removal  Postoperative diagnosis:   Same + soft palate/pharyngeal weakness on the right   Findings: surgical changes at the soft palate  on the right side likely from prior palatoplasty/pharyngoplasty, right VF paralysis on the right, without lesion or mass There appears to be evidence of incomplete closure with adduction but edges of both VF appear straight and there seems to be evidence of infraglottic augmentation noted on the left side could be from previous fat graft   Procedure: Flexible fiberoptic laryngoscopy  Surgeon: Ashok Croon, MD  Anesthesia: Topical lidocaine and Afrin Complications: None Condition is stable throughout exam  Indications and consent:  The patient presents to the clinic with above symptoms. Indirect laryngoscopy view was incomplete. Thus it was recommended that they undergo a flexible fiberoptic laryngoscopy. All of the risks,  benefits, and potential complications were reviewed with the patient preoperatively and verbal informed consent was obtained.  Procedure: The patient was seated upright in the clinic. Topical lidocaine and Afrin were applied to the nasal cavity. After adequate anesthesia had occurred, I then proceeded to pass the flexible telescope into the nasal cavity. The nasal cavity was patent without rhinorrhea or polyp. The nasopharynx was also patent without mass or lesion. The base of tongue was visualized and was normal. There were no signs of pooling of secretions in the piriform sinuses. The true vocal folds were mobile bilaterally. There were no signs of glottic or supraglottic mucosal lesion or mass. There was moderate interarytenoid pachydermia and post cricoid edema. The telescope was then slowly withdrawn and the patient tolerated the procedure throughout.   Studies Reviewed: CXR 03/24/23 FINDINGS: Cardiomediastinal silhouette unchanged in size and contour. No evidence of central vascular congestion. No interlobular septal thickening.   No pneumothorax or pleural effusion. Coarsened interstitial markings, with no confluent airspace disease.   No acute displaced fracture.  Degenerative changes of the spine.   IMPRESSION: No active cardiopulmonary disease  Assessment/Plan: Encounter Diagnoses  Name Primary?   Dysphonia Yes   Oropharyngeal dysphagia    Paralysis of right vocal fold    Vagus nerve palate paralysis    Tongue paralysis    Facial palsy    Chronic nasal congestion    Post-nasal drip    Environmental and seasonal allergies     Assessment and Plan Assessment & Plan Dysphonia/Vocal cord paralysis following high-vagal injury after right glomus tumor resection 2005 Dr Zadie Cleverly.  Chronic right vocal cord paralysis due to vagal nerve injury from past glomus tumor surgery. Multiple procedures done in the past to rehabilitate voice and swallowing including injection augmentation and fat injection under general anesthesia  12/2020.  She is here 2/2 vocal fatigue and her scope exam demonstrated the following SCOPE FINDINGS: surgical changes at the soft palate on the right side likely from prior palatoplasty/pharyngoplasty, right VF paralysis on the right, without lesion or mass. There appears to be evidence of incomplete closure with adduction but edges of both VF appear straight and there seems to be evidence of infraglottic augmentation noted on the left side could be from previous fat graft. Glottic gap is small. There is a collapse of the R AE fold likely sequelae of high-vagal injury years ago. Scant secretions in pyriforms.   We discussed repeat injection augmentation and thyroplasty. She is hesitant about invasive procedures due to anesthesia risks and age, lack of improvement with prior surgeries. Restylane injection in the office can be considered in the future, but may not significantly improve voice and her altered laryngeal anatomy with collapse of AE fold on the right could potentially make it challenging to complete - Refer to local speech therapy for voice and swallow therapy. - Provide information sheet about Restylane procedure for  consideration. - Prescribe nighttime allergy pill and Flonase twice daily for nasal drainage/phlegm (could potentially improve her voice). - Recommend seaweed paste Reflux Gourmet supplement for reflux control.  Chronic nasal congestion and Post-nasal drip Contributes to throat discomfort and mucus accumulation. - Xyzal 5 mg daily and Flonase twice daily for nasal drainage.  Gastroesophageal reflux disease (GERD) LPR Potential contributor to throat secretions and voice changes. Managed with pantoprazole and Pepsid. - continue current reflux medications -  Reflux Gourmet after meals - diet and lifestyle changes to minimize GERD - Refer to [employer] blog for dietary and lifestyle modifications/reflux cook  book  Nasolacrimal duct obstruction Chronic tear drainage issue with fluid accumulation on the right side. Previous surgical referral not completed due to insurance issues. - Advise seeking care at an academic hospital with an ophthalmology department accepting all insurances.  Thank you for allowing me to participate in the care of this patient. Please do not hesitate to contact me with any questions or concerns.   Ashok Croon, MD Otolaryngology [facility] Phone: [phone] Fax: [phone]    01/19/2024, 12:11 PM

## 2024-01-19 ENCOUNTER — Ambulatory Visit: Payer: No Typology Code available for payment source | Attending: Cardiology | Admitting: Cardiology

## 2024-01-19 ENCOUNTER — Encounter: Payer: Self-pay | Admitting: Cardiology

## 2024-01-19 VITALS — BP 160/62 | HR 70 | Ht 65.0 in | Wt 191.0 lb

## 2024-01-19 DIAGNOSIS — E782 Mixed hyperlipidemia: Secondary | ICD-10-CM | POA: Diagnosis not present

## 2024-01-19 DIAGNOSIS — I10 Essential (primary) hypertension: Secondary | ICD-10-CM | POA: Diagnosis not present

## 2024-01-19 DIAGNOSIS — I471 Supraventricular tachycardia, unspecified: Secondary | ICD-10-CM | POA: Diagnosis not present

## 2024-01-19 DIAGNOSIS — R55 Syncope and collapse: Secondary | ICD-10-CM | POA: Diagnosis not present

## 2024-01-19 MED ORDER — DILTIAZEM HCL ER COATED BEADS 120 MG PO CP24: 120.0000 mg | ORAL_CAPSULE | Freq: Every day | ORAL | 3 refills | Status: DC

## 2024-01-19 NOTE — Patient Instructions (Signed)
 Medication Instructions:  START Diltiazem CD 120 mg take one tablet by mouth daily   *If you need a refill on your cardiac medications before your next appointment, please call your pharmacy*  Follow-Up: At [facility], you and your health needs are our priority.  As part of our continuing mission to provide you with exceptional heart care, we have created designated Provider Care Teams.  These Care Teams include your primary Cardiologist (physician) and Advanced Practice Providers (APPs -  Physician Assistants and Nurse Practitioners) who all work together to provide you with the care you need, when you need it.   Your next appointment:   3 month(s)  Provider:   Elder Negus, MD     Other Instructions   1st Floor: - Lobby - Registration  - Pharmacy  - Lab - Cafe  2nd Floor: - PV Lab - Diagnostic Testing (echo, CT, nuclear med)  3rd Floor: - Vacant  4th Floor: - TCTS (cardiothoracic surgery) - AFib Clinic - Structural Heart Clinic - Vascular Surgery  - Vascular Ultrasound  5th Floor: - HeartCare Cardiology (general and EP) - Clinical Pharmacy for coumadin, hypertension, lipid, weight-loss medications, and med management appointments    Valet parking services will be available as well.

## 2024-01-19 NOTE — Progress Notes (Signed)
 Cardiology Office Note:  .   Date:  01/19/2024  ID:  Andrea Buck, DOB 04-25-1946, MRN [id number] PCP: [name], MD  [facility] Providers Cardiologist:  Truett Mainland, MD PCP: [name], MD  Chief Complaint  Patient presents with   Loss of Consciousness     Andrea Buck is a 78 y.o. female with hypertension, hyperlipidemia, SVT, syncope    Discussed the use of AI scribe software for clinical note transcription with the patient, who gave verbal consent to proceed.  History of Present Illness The patient, a retired Engineer, civil (consulting), presents with two episodes of syncope occurring over the past two to three months. Both episodes were associated with a strong urge to have a bowel movement. The first episode occurred after a meal, during which she felt dizzy and nauseous. She lost consciousness for approximately three to four minutes after standing up from the car. The second episode was similar, occurring after she stood up from the car to use the bathroom. She denies any loss of bowel or bladder control during these episodes.  In addition to the syncope, she has noticed episodes of palpitations, describing her heart as 'racing' and then slowing down. The last episode of palpitations was approximately three weeks ago. She has a history of hypertension and hyperlipidemia, and is currently taking Crestor. She denies any known family history of heart disease, but reports a family history of stroke. She consumes a significant amount of caffeine, but denies any alcohol or tobacco use.  The patient also reports feeling stressed, particularly due to family issues. She is generally active, but admits she may not be as active as she should be.      Vitals:   01/19/24 1450  BP: (!) 160/62  Pulse: 70  SpO2: 90%      Review of Systems  Cardiovascular:  Positive for palpitations and syncope. Negative for chest pain, dyspnea on exertion and leg swelling.        Studies  Reviewed: Marland Kitchen        EKG 01/19/2024: Normal sinus rhythm Indeterminate axis When compared with ECG of 25-Aug-2022 15:07, No significant change was found    Independently interpreted 10/2023: Chol 242, TG 67, HDL 50, LDL 181 HbA1C 6.1% Hb 13.1 Cr 0.81  Independently interpreted Echocardiogram 07/2023: EF 55 to 60%.  Grade 1 diastolic dysfunction.  GLS -17.7%, normal. Normal RV systolic function. Aortic sclerosis.  Long-term monitor 09/2023: HR 51 - 210, average 73 bpm. 252 SVT episodes, longest 26 seconds with an average rate of 148 bpm. Rhythm strip suggests salvos of AT. Occasional supraventricular ectopy, 2.6% Rare ventricular ectopy. No atrial fibrillation.   Physical Exam Vitals and nursing note reviewed.  Constitutional:      General: She is not in acute distress. Neck:     Vascular: No JVD.  Cardiovascular:     Rate and Rhythm: Normal rate and regular rhythm.     Heart sounds: Normal heart sounds. No murmur heard. Pulmonary:     Effort: Pulmonary effort is normal.     Breath sounds: Normal breath sounds. No wheezing or rales.  Musculoskeletal:     Right lower leg: No edema.     Left lower leg: No edema.      VISIT DIAGNOSES:   ICD-10-CM   1. Syncope and collapse  R55 EKG 12-Lead    2. SVT (supraventricular tachycardia) (HCC)  I47.10     3. Mixed hyperlipidemia  E78.2     4.  Primary hypertension  I10        Andrea Buck is a 78 y.o. female with hypertension, hyperlipidemia, SVT, syncope   Assessment & Plan Supraventricular tachycardia (SVT): Episodes of palpitations confirmed as SVT. SVT requires management but is not linked to syncope. - Prescribed diltiazem 120 mg once daily. - Instructed on vagal maneuvers for palpitations. - Consider referral to EP specialists for potential ablation if SVT persists.  Vasovagal syncope: Syncope episodes associated with bowel movement urge, suggestive of vasovagal syncope, unlikely to be related to SVT. -  Advised to avoid delaying bowel movements. - Ensure hydration with 2-3 liters of water daily. - Wear compression stockings. - If faint, sit or lie down, elevate legs, and hydrate.  Mixed hyperlipidemia: High cholesterol managed with Crestor, dosage recently increased to 40 mg daily.. - Check lipid panel during upcoming primary care appointment in May.      Meds ordered this encounter  Medications   diltiazem (CARDIZEM CD) 120 MG 24 hr capsule    Sig: Take 1 capsule (120 mg total) by mouth daily.    Dispense:  90 capsule    Refill:  3     F/u in 3 months  Signed, Elder Negus, MD

## 2024-02-17 ENCOUNTER — Ambulatory Visit: Attending: Otolaryngology | Admitting: Speech Pathology

## 2024-02-29 ENCOUNTER — Other Ambulatory Visit: Payer: Self-pay | Admitting: Family Medicine

## 2024-02-29 DIAGNOSIS — J4489 Other specified chronic obstructive pulmonary disease: Secondary | ICD-10-CM

## 2024-02-29 DIAGNOSIS — J301 Allergic rhinitis due to pollen: Secondary | ICD-10-CM

## 2024-03-08 ENCOUNTER — Other Ambulatory Visit: Payer: Self-pay | Admitting: Family Medicine

## 2024-03-11 ENCOUNTER — Other Ambulatory Visit: Payer: Self-pay | Admitting: Student

## 2024-03-11 DIAGNOSIS — I10 Essential (primary) hypertension: Secondary | ICD-10-CM

## 2024-03-14 ENCOUNTER — Other Ambulatory Visit: Payer: Self-pay | Admitting: *Deleted

## 2024-03-14 DIAGNOSIS — E782 Mixed hyperlipidemia: Secondary | ICD-10-CM

## 2024-03-14 DIAGNOSIS — R7303 Prediabetes: Secondary | ICD-10-CM

## 2024-03-15 ENCOUNTER — Other Ambulatory Visit: Payer: No Typology Code available for payment source

## 2024-03-15 DIAGNOSIS — R7303 Prediabetes: Secondary | ICD-10-CM | POA: Diagnosis not present

## 2024-03-15 DIAGNOSIS — E782 Mixed hyperlipidemia: Secondary | ICD-10-CM

## 2024-03-16 ENCOUNTER — Ambulatory Visit: Payer: Self-pay | Admitting: Family Medicine

## 2024-03-16 LAB — LIPID PANEL
Chol/HDL Ratio: 4 ratio (ref 0.0–4.4)
Cholesterol, Total: 201 mg/dL — ABNORMAL HIGH (ref 100–199)
HDL: 50 mg/dL (ref >39)
LDL Chol Calc (NIH): 139 mg/dL — ABNORMAL HIGH (ref 0–99)
Triglycerides: 68 mg/dL (ref 0–149)
VLDL Cholesterol Cal: 12 mg/dL (ref 5–40)

## 2024-03-16 LAB — HEMOGLOBIN A1C
Est. average glucose Bld gHb Est-mCnc: 126 mg/dL
Hgb A1c MFr Bld: 6 % — ABNORMAL HIGH (ref 4.8–5.6)

## 2024-03-22 ENCOUNTER — Ambulatory Visit (INDEPENDENT_AMBULATORY_CARE_PROVIDER_SITE_OTHER): Payer: No Typology Code available for payment source | Admitting: Family Medicine

## 2024-03-22 ENCOUNTER — Encounter: Payer: Self-pay | Admitting: Family Medicine

## 2024-03-22 VITALS — BP 122/80 | HR 68 | Ht 65.0 in | Wt 188.0 lb

## 2024-03-22 DIAGNOSIS — I10 Essential (primary) hypertension: Secondary | ICD-10-CM | POA: Diagnosis not present

## 2024-03-22 DIAGNOSIS — E782 Mixed hyperlipidemia: Secondary | ICD-10-CM | POA: Diagnosis not present

## 2024-03-22 DIAGNOSIS — R42 Dizziness and giddiness: Secondary | ICD-10-CM | POA: Diagnosis not present

## 2024-03-22 DIAGNOSIS — E785 Hyperlipidemia, unspecified: Secondary | ICD-10-CM

## 2024-03-22 NOTE — Patient Instructions (Signed)
 It was nice to see you today,  We addressed the following topics today: -Continue to take your blood pressure medication as directed. - I am giving you a sheet of paper for you to write your blood pressure readings on, twice a day for the next 2 weeks and then bring it back to us  - If your blood pressure gets elevated again, make sure you have taken your blood pressure medication for the day.  Do not take more than the prescribed amount for that day.  Wait at least an hour before rechecking if you had to take medication. - I think your dizziness is caused by something called benign positional vertigo.  This is related to the inner ear and not necessarily your blood pressure. - Reasons to have someone take you to the emergency department is if the dizziness does not resolve after an hour or is associated with other symptoms or a blood pressure greater than 210/120.  Have a great day,  Etha Henle, MD

## 2024-03-22 NOTE — Progress Notes (Signed)
   Established Patient Office Visit  Subjective   Patient ID: Andrea Buck, female    DOB: 06/01/1946  Age: 78 y.o. MRN: [id number]  Chief Complaint  Patient presents with   Medical Management of Chronic Issues    HPI  Subjective - Vertigo: Woke up last night with severe room-spinning sensation, unable to stand without assistance, unable to see clearly - Dizziness persisted, requiring assistance to ambulate to bathroom - Denies prior episodes of vertigo - Reports feeling unwell throughout the day prior with low energy - Denies ear pain or hearing issues - Reports mild nausea without vomiting - Reports feeling tremulous with shaking sensation - Reports elevated heart rate to 112 (baseline 60-70) - Reports elevated blood pressure readings at home with highest reading 220/120 - Reports improvement in symptoms today, able to ambulate independently  Medications: diltiazem  (Cardizem ) prescribed by cardiologist, rosuvastatin  (Crestor ) 40mg  daily for cholesterol, blood pressure medication taken in evening around 7pm, scopolamine patches for secretions  PMH: hypertension, hyperlipidemia, cardiac history requiring diltiazem , history of syncope (resolved), voice/throat issues with excessive secretions during allergy season, allergies  ROS: Denies ear pain, hearing issues, or vomiting. Reports nausea, dizziness, room-spinning sensation, tremulousness, excessive oral secretions.    The 10-year ASCVD risk score (Arnett DK, et al., 2019) is: 15.9%  Health Maintenance Due  Topic Date Due   DTaP/Tdap/Td (2 - Tdap) 05/27/2009   Colonoscopy  07/28/2021   Medicare Annual Wellness (AWV)  12/17/2022   COVID-19 Vaccine (7 - 2024-25 season) 06/28/2023      Objective:     BP 122/80   Pulse 68   Ht 5\' 5"  (1.651 m)   Wt 188 lb 0.6 oz (85.3 kg)   SpO2 100%   BMI 31.29 kg/m    Physical Exam General: Alert, oriented CV: Regular rate rhythm Pulmonary: Lungs clear bilaterally Neuro: No  nystagmus.  No dysarthria.  No aphasia.  Cranial nerves II through XII grossly intact.  Finger-nose testing intact.   No results found for any visits on 03/22/24.      Assessment & Plan:   HYPERTENSION, BENIGN SYSTEMIC Assessment & Plan: - Monitor Home blood pressure readings for 2 weeks with log - Elevated readings last night possibly related to missed medication dose and anxiety - Current BP readings improved but still elevated - Treat: Continue current regimen, advised against taking extra doses if medication is missed   Mixed hyperlipidemia  Dyslipidemia Assessment & Plan: -  Recheck at next visit - Cholesterol "still a little high" despite maximum dose of rosuvastatin  - Currently on maximum dose of rosuvastatin  40mg  daily - Treat: No changes to current regimen   Vertigo Assessment & Plan: - Home blood pressure monitoring with log - Likely benign positional vertigo vs hypertension-related symptoms - Symptoms improved today, no longer experiencing room-spinning sensation - No medication changes at this time      Return in about 4 months (around 07/23/2024) for HTN.    Laneta Pintos, MD

## 2024-03-22 NOTE — Assessment & Plan Note (Deleted)
 Hyperlipidemia - Monitor: Recheck at next visit - Evaluate: Cholesterol "still a little high" despite maximum dose of rosuvastatin  - Assess: Currently on maximum dose of rosuvastatin  40mg  daily - Treat: No changes to current regimen

## 2024-03-22 NOTE — Assessment & Plan Note (Signed)
-    Recheck at next visit - Cholesterol "still a little high" despite maximum dose of rosuvastatin  - Currently on maximum dose of rosuvastatin  40mg  daily - Treat: No changes to current regimen

## 2024-03-22 NOTE — Assessment & Plan Note (Signed)
-   Monitor Home blood pressure readings for 2 weeks with log - Elevated readings last night possibly related to missed medication dose and anxiety - Current BP readings improved but still elevated - Treat: Continue current regimen, advised against taking extra doses if medication is missed

## 2024-03-22 NOTE — Assessment & Plan Note (Signed)
-   Home blood pressure monitoring with log - Likely benign positional vertigo vs hypertension-related symptoms - Symptoms improved today, no longer experiencing room-spinning sensation - No medication changes at this time

## 2024-03-29 ENCOUNTER — Ambulatory Visit: Attending: Otolaryngology | Admitting: Speech Pathology

## 2024-03-29 NOTE — Therapy (Deleted)
 OUTPATIENT SPEECH LANGUAGE PATHOLOGY VOICE EVALUATION   Patient Name: Andrea Buck MRN: [id number] DOB:August 27, 1946, 78 y.o., female Today's Date: 03/29/2024  PCP: Aaron Aas REFERRING PROVIDER: ***  END OF SESSION:   Past Medical History:  Diagnosis Date   Acute cystitis with hematuria 07/22/2018   Anxiety and depression 09/06/2018   Blood transfusion    after hysterectomy   Bronchitis, chronic (HCC) 2005   Carpal tunnel syndrome 12/24/2006   Qualifier: Diagnosis of  By: Koval Pharm D, Peter     CHOLELITHIASIS 04/25/2009   Qualifier: Diagnosis of  By: Chanetta Comes MD, Ares Cardozo     COPD (chronic obstructive pulmonary disease) (HCC)    Depression    GASTROESOPHAGEAL REFLUX, NO ESOPHAGITIS 12/24/2006   Qualifier: Diagnosis of  By: Shellye Dibble TUMOR 2001   Qualifier: Diagnosis of  By: Chanetta Comes MD, Lindy Pennisi     Hyperlipidemia    Hypertension    MENOPAUSAL SYNDROME 12/24/2006   Qualifier: Diagnosis of  By: Kayleen Party     NECK MASS 12/29/2006   Qualifier: Diagnosis of  By: Sulema Endo CMA, [place]     Sciatica 02/10/2017   Screening for hypothyroidism 07/21/2017   Vocal cord paralysis 05/26/2011   RIGHT vocal cord parlaysis Seen by Dr,. Fanny Homme at [facility].( May 03, 2011)  Placed on scopalamine patch and w f/u [facility]    Past Surgical History:  Procedure Laterality Date   ABDOMINAL HYSTERECTOMY     CARPAL TUNNEL RELEASE     CHOLECYSTECTOMY     LUMBAR LAMINECTOMY     (4-5)   palatal adhesion     removal of glomus vagales tumor-non cancerious     TUBAL LIGATION     UPPER GASTROINTESTINAL ENDOSCOPY  2006   Patient Active Problem List   Diagnosis Date Noted   Vertigo 03/22/2024   SVT (supraventricular tachycardia) (HCC) 01/19/2024   Mixed hyperlipidemia 01/19/2024   Age-related cataract of both eyes 12/23/2023   Hip pain 11/18/2023   Chronic pruritus 08/18/2023   Change in bowel movement 12/15/2022   Syncope 10/15/2022   Healthcare  maintenance 06/05/2022   Osteopenia of neck of femur 07/21/2021   Vitamin D  deficiency 05/29/2021   Flank pain 05/29/2021   History of COVID-19 11/19/2019   Psychosocial stressors 09/06/2018   Asthmatic bronchitis , chronic (HCC)  vs UACS 09/06/2018   Seasonal allergic rhinitis due to pollen 07/22/2018   Age-related osteoporosis without current pathological fracture 07/21/2017   Estrogen deficiency 07/02/2017   Posterior vitreous detachment of both eyes 10/15/2015   Dyslipidemia 06/18/2015   Oropharyngeal dysphagia 05/17/2015   Schatzki's ring 04/18/2015   Bronchitis 09/02/2013   Sleep disorder 09/14/2012   Combined senile cataract 08/09/2012   Myopia with astigmatism and presbyopia 08/09/2012   Chronic right shoulder pain 05/26/2012   Vocal cord paralysis 05/26/2011   GLOMUS TUMOR 04/19/2009   OBESITY, NOS 12/24/2006   Major depressive disorder, recurrent episode (HCC) 12/24/2006   HYPERTENSION, BENIGN SYSTEMIC 12/24/2006   GASTROESOPHAGEAL REFLUX, NO ESOPHAGITIS 12/24/2006   Osteoarthritis 12/24/2006    Onset date: ***  REFERRING DIAG: ***  THERAPY DIAG:  No diagnosis found.  Rationale for Evaluation and Treatment: {HABREHAB:27488}  SUBJECTIVE:   SUBJECTIVE STATEMENT: *** Pt accompanied by: {accompnied:27141}  PERTINENT HISTORY: ***  PAIN:  Are you having pain? {OPRCPAIN:27236}  FALLS: Has patient fallen in last 6 months? {yes/no:20286}, Number of falls: ***  LIVING ENVIRONMENT: Lives with: {OPRC lives with:25569::"lives with their  family"} Lives in: {Lives in:25570}  PLOF:Level of assistance: {VWUJWJX:91478} Employment: {SLPemployment:25674}  PATIENT GOALS: ***  OBJECTIVE:  Note: Objective measures were completed at Evaluation unless otherwise noted.  DIAGNOSTIC FINDINGS: ***  COGNITION: Overall cognitive status: Within functional limits for tasks assessed  Voice Evaluation: SOCIAL HISTORY: Occupation: *** Water intake:  {waterintake:27189} Caffeine/alcohol intake: {intakelevel:27191} Daily voice use: {intakelevel:27191}  On a scale of 1-5 where 1= very little and 5= excessive, how would you characterize your daily average voice use? *** On a scale of 0-5, where 0= never and 5= always, how often do you do the following?  Shout or scream *** Talk loudly *** Talk a lot *** Talk over noise *** Use the phone *** Sing *** Surgical history: ***  PHYSICAL SYMPTOMS: Do you have any burning, soreness, tickling, or irritation in your throat? {yes/no:20286}  If yes, can you describe the sensation? *** Do you sometimes have a sensation of a lump in your throat?  {yes/no:20286} Do you have any aching or tightness in your throat? {yes/no:20286} Do you ever feel tension in your neck area? {yes/no:20286} Does your voice get tired easily? {yes/no:20286} Do you feel as if you have to strain to produce voice? {yes/no:20286} Do you feel as if you need to cough or clear your throat a lot? {yes/no:20286} Do you ever lose your voice completely? {yes/no:20286} Do you ever have difficulty swallowing? {yes/no:20286} Do you have difficulty projecting your voice? {yes/no:20286}  VOCAL ABUSE:  Episodes observed during evaluation: *** Characteristics observed during evaluation: *** Pt report of frequency: *** Pt report of duration: *** Pt description of *** episodes:  Identified triggers: {COUGH TRIGGERS:29247}  PERCEPTUAL VOICE ASSESSMENT: Voice quality: {VQL:27192} Vocal abuse: {VA:27193} Resonance: {resonance:27194} Respiratory function: {respbreathing:27195}  OBJECTIVE VOICE ASSESSMENT: Maximum phonation time for sustained "ah": *** Conversational pitch average: *** Hz Conversational pitch range: *** Hz Conversational loudness average: *** dB Conversational loudness range: *** dB S/z ratio: *** (Suggestive of dysfunction >1.0)  STIMULIBILITY TRIALS FOR IMPROVED VOCAL QUALITY: ***  PATIENT REPORTED OUTCOME  MEASURES (PROM): {SLPPROM:27095}  Dysphagia Evaluation: SUBJECTIVE DYSPHAGIA REPORTS:  Date of onset: *** Reported symptoms: {dysphagia symptoms:29766}  Current diet: {slpdiet:27196}  Co-morbid voice changes: {yes/no:20286}  FACTORS WHICH MAY INCREASE RISK OF ADVERSE EVENT IN PRESENCE OF ASPIRATION:  General health: {CSE general health:29764}  Risk factors: {CSE risk factors:29763}    ORAL MOTOR EXAMINATION: Overall status: {OMESLP2:27645} Comments: ***  CLINICAL SWALLOW ASSESSMENT:   Dentition: {CSE dentition:29769} Vocal quality at baseline: {VQL:27192} Patient directly observed with POs: {POobserved:27199} Feeding: {slp feeding:27200} Liquids provided by: {SLPliquids:27201} [facility] Swallow Protocol: {Pass/Fail (Optional):210140017} Oral phase signs and symptoms: {SLPoralphase:27202} Pharyngeal phase signs and symptoms: {SLPpharyngealphase:27203}  PATIENT REPORTED OUTCOME MEASURES (PROM): {SLPPROM:27095}   TODAY'S TREATMENT:    03/29/24: ***  PATIENT EDUCATION: Education details: *** Person educated: {Person educated:25204} Education method: {Education Method:25205} Education comprehension: {Education Comprehension:25206}  HOME EXERCISE PROGRAM: ***  GOALS: Goals reviewed with patient? {yes/no:20286}  SHORT TERM GOALS: Target date: ***  *** Baseline: Goal status: INITIAL  2.  *** Baseline:  Goal status: INITIAL  3.  *** Baseline:  Goal status: INITIAL  4.  *** Baseline:  Goal status: INITIAL  5.  *** Baseline:  Goal status: INITIAL  6.  *** Baseline:  Goal status: INITIAL  LONG TERM GOALS: Target date: ***  *** Baseline:  Goal status: INITIAL  2.  *** Baseline:  Goal status: INITIAL  3.  *** Baseline:  Goal status: INITIAL  4.  *** Baseline:  Goal status: INITIAL  5.  *** Baseline:  Goal status: INITIAL  6.  *** Baseline:  Goal status: INITIAL  ASSESSMENT:  CLINICAL IMPRESSION: Patient is a *** y.o. *** who was seen  today for voice evaluation. Evaluation reveals {MILD/MOD/MARKED/SEVERE:28894} dysphonia. Pt's voice is c/b ***. Pt reports ***. Pt would benefit from skilled ST to address aforementioned deficits to {CI outcomes:29167}.    OBJECTIVE IMPAIRMENTS: include {SLPOBJIMP:27107}. These impairments are limiting patient from {SLPLIMIT:27108}. Factors affecting potential to achieve goals and functional outcome are {SLP factors:25450}.. Patient will benefit from skilled SLP services to address above impairments and improve overall function.  REHAB POTENTIAL: {rehabpotential:25112}  PLAN:  SLP FREQUENCY: {rehab frequency:25116}  SLP DURATION: {rehab duration:25117}  PLANNED INTERVENTIONS: {SLP treatment/interventions:25449}    Alston Jerry, CCC-SLP 03/29/2024, 7:56 AM

## 2024-03-30 ENCOUNTER — Other Ambulatory Visit: Payer: Self-pay | Admitting: Family Medicine

## 2024-03-30 DIAGNOSIS — J4489 Other specified chronic obstructive pulmonary disease: Secondary | ICD-10-CM

## 2024-04-13 ENCOUNTER — Other Ambulatory Visit: Payer: Self-pay | Admitting: Family Medicine

## 2024-04-13 DIAGNOSIS — I10 Essential (primary) hypertension: Secondary | ICD-10-CM

## 2024-04-13 MED ORDER — LOSARTAN POTASSIUM 25 MG PO TABS
25.0000 mg | ORAL_TABLET | Freq: Every day | ORAL | 2 refills | Status: DC
Start: 2024-04-13 — End: 2024-07-19

## 2024-04-21 ENCOUNTER — Ambulatory Visit: Attending: Cardiology | Admitting: Cardiology

## 2024-04-21 ENCOUNTER — Encounter: Payer: Self-pay | Admitting: Cardiology

## 2024-04-21 VITALS — BP 144/83 | HR 68 | Ht 65.0 in | Wt 189.0 lb

## 2024-04-21 DIAGNOSIS — R55 Syncope and collapse: Secondary | ICD-10-CM

## 2024-04-21 DIAGNOSIS — I471 Supraventricular tachycardia, unspecified: Secondary | ICD-10-CM | POA: Diagnosis not present

## 2024-04-21 DIAGNOSIS — E782 Mixed hyperlipidemia: Secondary | ICD-10-CM

## 2024-04-21 MED ORDER — METOPROLOL SUCCINATE ER 25 MG PO TB24: 25.0000 mg | ORAL_TABLET | Freq: Every day | ORAL | 5 refills | Status: DC

## 2024-04-21 NOTE — Patient Instructions (Addendum)
 Medication Instructions:   STOP TAKING DILTIAZEM  NOW  START TAKING METOPROLOL SUCCINATE (TOPROL XL) 25 MG BY MOUTH DAILY IN THE EVENING   *If you need a refill on your cardiac medications before your next appointment, please call your pharmacy*   You have been referred to OUR LIPID CLINIC TO SEE THE PHARMACIST HERE IN THIS OFFICE--FOR CHOLESTEROL MANAGEMENT   You have been referred to SEE ONE OF OUR ELECTROPHYSIOLOGIST HERE IN THE OFFICE FOR SVT--YOU WILL GET A CALL BACK SOMETIME SOON TO ARRANGE THIS APPOINTMENT   Follow-Up: At [facility], you and your health needs are our priority.  As part of our continuing mission to provide you with exceptional heart care, our providers are all part of one team.  This team includes your primary Cardiologist (physician) and Advanced Practice Providers or APPs (Physician Assistants and Nurse Practitioners) who all work together to provide you with the care you need, when you need it.  Your next appointment:   6 month(s)  Provider:   Newman JINNY Lawrence, MD

## 2024-04-21 NOTE — Progress Notes (Signed)
 Cardiology Office Note:  .   Date:  04/21/2024  ID:  Andrea Buck, DOB 02/14/1946, MRN [id number] PCP: Chandra Toribio POUR, MD  [facility] Providers Cardiologist:  Newman Lawrence, MD PCP: Chandra Toribio POUR, MD  Chief Complaint  Patient presents with   SVT     Andrea Buck is a 78 y.o. female with hypertension, hyperlipidemia, prediabetes, SVT, syncope    History of Present Illness Started on diltiazem  at last visit, did not tolerate due to complaints of dizziness.  She has not had any recurrent syncope, but felt lightheadedness episodes as described.  These have improved after stopping diltiazem .  Reviewed recent lipid panel results with the patient, details below.      Vitals:   04/21/24 1512  BP: (!) 144/83  Pulse: 68  SpO2: 100%       Review of Systems  Cardiovascular:  Positive for palpitations. Negative for chest pain, dyspnea on exertion, leg swelling and syncope.        Studies Reviewed: SABRA        EKG 01/19/2024: Normal sinus rhythm Indeterminate axis When compared with ECG of 25-Aug-2022 15:07, No significant change was found    Labs 02/2024: Chol 201, TG 68, HDL 50, LDL 139 HbA1C 6.0%  07/2023-/2025: Chol 242, TG 67, HDL 50, LDL 181 HbA1C 6.1% Hb 13.1 Cr 0.81  Independently interpreted Echocardiogram 07/2023: EF 55 to 60%.  Grade 1 diastolic dysfunction.  GLS -17.7%, normal. Normal RV systolic function. Aortic sclerosis.  Long-term monitor 09/2023: HR 51 - 210, average 73 bpm. 252 SVT episodes, longest 26 seconds with an average rate of 148 bpm. Rhythm strip suggests salvos of AT. Occasional supraventricular ectopy, 2.6% Rare ventricular ectopy. No atrial fibrillation.   Physical Exam Vitals and nursing note reviewed.  Constitutional:      General: She is not in acute distress. Neck:     Vascular: No JVD.   Cardiovascular:     Rate and Rhythm: Normal rate and regular rhythm.     Heart sounds: Normal heart sounds. No  murmur heard. Pulmonary:     Effort: Pulmonary effort is normal.     Breath sounds: Normal breath sounds. No wheezing or rales.   Musculoskeletal:     Right lower leg: No edema.     Left lower leg: No edema.      VISIT DIAGNOSES:   ICD-10-CM   1. SVT (supraventricular tachycardia) (HCC)  I47.10 metoprolol  succinate (TOPROL -XL) 25 MG 24 hr tablet    Ambulatory referral to Cardiac Electrophysiology    2. Mixed hyperlipidemia  E78.2 AMB Referral to [facility] Pharm-D    3. Syncope and collapse  R55         Andrea Buck is a 78 y.o. female with hypertension, hyperlipidemia, prediabetes, SVT, syncope   Assessment & Plan Supraventricular tachycardia (SVT): Episodes of palpitations confirmed as SVT. SVT requires management but is not linked to syncope. She did not tolerate diltiazem  due to lightheadedness. Started metoprolol  succinate 25 mg daily.  In addition, will refer to EP for consideration for ablation.   Vasovagal syncope: Syncope episodes associated with bowel movement urge, suggestive of vasovagal syncope, unlikely to be related to SVT. Recommend hydration, complaints of, counterpressure maneuvers.  Mixed hyperlipidemia: LDL down from 180 to 140 on Crestor  5 mg daily.  Given prediabetes and very high LDL, referred to lipid clinic for evaluation for injectable agents.        Meds ordered this encounter  Medications  metoprolol  succinate (TOPROL -XL) 25 MG 24 hr tablet    Sig: Take 1 tablet (25 mg total) by mouth daily. Take with or immediately following a meal.    Dispense:  30 tablet    Refill:  5     F/u in 6 months  Signed, Newman JINNY Lawrence, MD

## 2024-04-22 ENCOUNTER — Ambulatory Visit: Admitting: Cardiology

## 2024-04-22 ENCOUNTER — Encounter: Payer: Self-pay | Admitting: Cardiology

## 2024-04-28 ENCOUNTER — Other Ambulatory Visit: Payer: Self-pay | Admitting: Family Medicine

## 2024-04-28 DIAGNOSIS — J4489 Other specified chronic obstructive pulmonary disease: Secondary | ICD-10-CM

## 2024-05-06 ENCOUNTER — Ambulatory Visit: Attending: Internal Medicine | Admitting: Internal Medicine

## 2024-05-06 VITALS — BP 164/90 | HR 52 | Ht 65.0 in | Wt 194.0 lb

## 2024-05-06 DIAGNOSIS — I471 Supraventricular tachycardia, unspecified: Secondary | ICD-10-CM | POA: Diagnosis not present

## 2024-05-06 MED ORDER — FLECAINIDE ACETATE 50 MG PO TABS: 50.0000 mg | ORAL_TABLET | Freq: Two times a day (BID) | ORAL | 3 refills | Status: DC

## 2024-05-06 NOTE — Progress Notes (Signed)
 HPI Andrea Buck is referred by Andrea Buck for evaluation of SVT. She is a pleasant obese 78 yo woman with HTN, who has palpitations. She describes 2 episodes of frank syncope which occurred when she finished eating, got abdominal pain, nausea, and diaphoresis and passed out. She noted some associated palpitations. She wore a cardiac monitor and she was noted to have several hundred episodes of SVT, the longest less than a minute. She feels palpitations when she goes to bed.  No Known Allergies   Current Outpatient Medications  Medication Sig Dispense Refill   albuterol  (VENTOLIN  HFA) 108 (90 Base) MCG/ACT inhaler Inhale 2 puffs into the lungs every 4 (four) hours as needed for wheezing or shortness of breath. 18 g 0   EQ ALLERGY RELIEF, CETIRIZINE , 10 MG tablet Take 1 tablet by mouth once daily 90 tablet 0   famotidine  (PEPCID ) 20 MG tablet TAKE 1 TABLET BY MOUTH ONCE DAILY AFTER SUPPER 30 tablet 1   fluticasone  (FLONASE ) 50 MCG/ACT nasal spray Place 2 sprays into both nostrils 2 (two) times daily. 16 g 6   levocetirizine (XYZAL ) 5 MG tablet Take 5 mg by mouth daily.     losartan  (COZAAR ) 25 MG tablet Take 1 tablet (25 mg total) by mouth at bedtime. 100 tablet 2   meloxicam  (MOBIC ) 15 MG tablet Take 1 tablet by mouth once daily 30 tablet 0   metoprolol  succinate (TOPROL -XL) 25 MG 24 hr tablet Take 1 tablet (25 mg total) by mouth daily. Take with or immediately following a meal. 30 tablet 5   mometasone  (NASONEX ) 50 MCG/ACT nasal spray Place 1 spray into the nose daily. 17 g 3   Multiple Vitamin (MULTI-VITAMINS) TABS Take by mouth.     pantoprazole  (PROTONIX ) 40 MG tablet TAKE 1 TABLET BY MOUTH ONCE DAILY 30-60  MINUTES  BEFORE  FIRST  MEAL  OF  THE  DAY 30 tablet 0   rosuvastatin  (CRESTOR ) 40 MG tablet Take 1 tablet (40 mg total) by mouth daily. 90 tablet 1   sertraline  (ZOLOFT ) 100 MG tablet Take 1.5 tablets (150 mg total) by mouth at bedtime. 135 tablet 1   traZODone  (DESYREL ) 50  MG tablet Take 1 tablet (50 mg total) by mouth at bedtime. 90 tablet 1   Vibegron (GEMTESA) 75 MG TABS Take 1 mg by mouth.     No current facility-administered medications for this visit.     Past Medical History:  Diagnosis Date   Acute cystitis with hematuria 07/22/2018   Anxiety and depression 09/06/2018   Blood transfusion    after hysterectomy   Bronchitis, chronic (HCC) 2005   Carpal tunnel syndrome 12/24/2006   Qualifier: Diagnosis of  By: Buck Pharm D, Andrea     CHOLELITHIASIS 04/25/2009   Qualifier: Diagnosis of  By: Andrea Buck     COPD (chronic obstructive pulmonary disease) (HCC)    Depression    GASTROESOPHAGEAL REFLUX, NO ESOPHAGITIS 12/24/2006   Qualifier: Diagnosis of  By: Andrea Buck LAURIE TUMOR 2001   Qualifier: Diagnosis of  By: Andrea Buck     Hyperlipidemia    Hypertension    MENOPAUSAL SYNDROME 12/24/2006   Qualifier: Diagnosis of  By: Andrea Buck     NECK MASS 12/29/2006   Qualifier: Diagnosis of  By: Andrea Buck     Sciatica 02/10/2017   Screening for hypothyroidism 07/21/2017   Vocal cord paralysis 05/26/2011   RIGHT  vocal cord parlaysis Seen by Dr,. CANDIE Franchot Buck at [facility] May 03, 2011)  Placed on scopalamine patch and w f/u [facility]     ROS:   All systems reviewed and negative except as noted in the HPI.   Past Surgical History:  Procedure Laterality Date   ABDOMINAL HYSTERECTOMY     CARPAL TUNNEL RELEASE     CHOLECYSTECTOMY     LUMBAR LAMINECTOMY     (4-5)   palatal adhesion     removal of glomus vagales tumor-non cancerious     TUBAL LIGATION     UPPER GASTROINTESTINAL ENDOSCOPY  2006     Family History  Problem Relation Age of Onset   Cancer Mother        Uterus   Diabetes Mother    Hypertension Mother    Heart disease Father    Stroke Father    Hypertension Father    Diabetes Father    Obesity Sister    Diabetes Sister    Hypertension Sister    Diabetes Sister     Hypertension Sister    Diabetes Sister    Hypertension Sister    Diabetes Brother    Multiple sclerosis Brother    Diabetes Brother    Hypertension Brother    Mental illness Son        PTSD   Diabetes Son    Hypertension Son    Hyperlipidemia Son    Obesity Son    Drug abuse Son    Arthritis Son    Drug abuse Son    Hyperlipidemia Son    Colon cancer Neg Hx    Colon polyps Neg Hx    Stomach cancer Neg Hx    Esophageal cancer Neg Hx    Rectal cancer Neg Hx      Social History   Socioeconomic History   Marital status: Widowed    Spouse name: Not on file   Number of children: 5   Years of education: 14   Highest education level: Associate Buck: occupational, Scientist, product/process development, or vocational program  Occupational History   Occupation: LPN    Employer: [employer]  Tobacco Use   Smoking status: Never    Passive exposure: Never   Smokeless tobacco: Never  Vaping Use   Vaping status: Never Used  Substance and Sexual Activity   Alcohol use: No   Drug use: No   Sexual activity: Yes    Birth control/protection: Post-menopausal    Comment: Hysterectomy  Other Topics Concern   Not on file  Social History Narrative   Emergency Contact: Daughter, Andrea Buck (c) [phone]   End of Life Plan: Info on Advanced Directives given   Who lives with you: Patient lives with daughter and son in Social [place].    Any pets: None   Exercise:Tries to exercise twice weekly   Seatbelts: Patient reports wearing her seatbelt when in vehicle.    Religous: Patient is very active in her church.          Patient has stressors with her sons. Patient reports one has had multiple strokes and one is homeless.       Patient is happy with her current living situation as this has been an issue in the past. Patient encouraged to reach out if/when she would like to change this.    Social Drivers of Health   Financial Resource Strain: Low Risk  (12/17/2021)   Overall Financial Resource Strain (CARDIA)  Difficulty of Paying Living Expenses: Not hard at all  Food Insecurity: Food Insecurity Present (12/17/2021)   Hunger Vital Sign    Worried About Running Out of Food in the Last Year: Sometimes true    Ran Out of Food in the Last Year: Sometimes true  Transportation Needs: No Transportation Needs (12/17/2021)   PRAPARE - Administrator, Civil Service (Medical): No    Lack of Transportation (Non-Medical): No  Physical Activity: Insufficiently Active (12/17/2021)   Exercise Vital Sign    Days of Exercise per Week: 2 days    Minutes of Exercise per Session: 40 min  Stress: No Stress Concern Present (12/17/2021)   [employer] of Occupational Health - Occupational Stress Questionnaire    Feeling of Stress : Only a little  Social Connections: Moderately Integrated (12/17/2021)   Social Connection and Isolation Panel    Frequency of Communication with Friends and Family: More than three times a week    Frequency of Social Gatherings with Friends and Family: More than three times a week    Attends Religious Services: More than 4 times per year    Active Member of [employer] or Organizations: Yes    Attends Banker Meetings: More than 4 times per year    Marital Status: Widowed  Intimate Partner Violence: Not At Risk (12/17/2021)   Humiliation, Afraid, Rape, and Kick questionnaire    Fear of Current or Ex-Partner: No    Emotionally Abused: No    Physically Abused: No    Sexually Abused: No     BP (!) 164/90   Pulse (!) 52   Ht 5' 5 (1.651 m)   Wt 194 lb (88 kg)   SpO2 100%   BMI 32.28 kg/m   Physical Exam:  obese appearing 78 yo woman, NAD HEENT: Unremarkable Neck:  No JVD, no thyromegally Lymphatics:  No adenopathy Back:  No CVA tenderness Lungs:  Clear with no wheezes HEART:  Regular rate rhythm, no murmurs, no rubs, no clicks Abd:  soft, positive bowel sounds, no organomegally, no rebound, no guarding Ext:  2 plus pulses, no edema, no cyanosis, no  clubbing Skin:  No rashes no nodules Neuro:  CN II through XII intact, motor grossly intact  Assess/Plan: SVT - the episodes are very brief and are likely due to atrial tachycardia. I have recommended a trial of low dose flecainide  in conjunction with low dose toprol . She is not a good candidate for ablation due to the non-sustained nature of her arrhythmias. Syncope - these appear neurally mediated. She will undergo watchful waiting.   Danelle Zan Orlick,MD

## 2024-05-06 NOTE — Patient Instructions (Addendum)
 Medication Instructions:  Your physician has recommended you make the following change in your medication:  Start Flecainide  50 mg twice daily.  Lab Work: None ordered.  You may go to any [facility] Location for your lab work:  [facility] - [address] ([facility]) - [address] [phone]  [place] - [address]   [place]  - [address]   [place] - [address] - [address] [facility] [facility]  - [address] [place] - [address] ([facility]   If you have labs (blood work) drawn today and your tests are completely normal, you will receive your results only by: [employer] (if you have MyChart)  If you have any lab test that is abnormal or we need to change your treatment, we will call you or send a MyChart message to review the results.  Testing/Procedures: None ordered.  Follow-Up: At [facility], you and your health needs are our priority.  As part of our continuing mission to provide you with exceptional heart care, we have created designated Provider Care Teams.  These Care Teams include your primary Cardiologist (physician) and Advanced Practice Providers (APPs -  Physician Assistants and Nurse Practitioners) who all work together to provide you with the care you need, when you need it.  We recommend signing up for the patient portal called MyChart.  Sign up information is provided on this After Visit Summary.  MyChart is used to connect with patients for Virtual Visits (Telemedicine).  Patients are able to view lab/test results, encounter notes, upcoming appointments, etc.  Non-urgent messages can be sent to your provider as well.   To learn more about what you can do with MyChart, go to [email or web address].    Your next appointment:   EKG nurse visit in 3 weeks Follow up with Dr Waddell or APP in 2 months  The format for your next  appointment:   In Person  Provider:   Danelle Waddell, MD{or one of the following Advanced Practice Providers on your designated Care Team:   Charlies Arthur, PA-C Michael Andy Tillery, PA-C Brandy Ollis, NP

## 2024-05-30 ENCOUNTER — Telehealth: Payer: Self-pay | Admitting: Pharmacist

## 2024-05-30 ENCOUNTER — Other Ambulatory Visit (HOSPITAL_COMMUNITY): Payer: Self-pay

## 2024-05-30 ENCOUNTER — Telehealth: Payer: Self-pay

## 2024-05-30 NOTE — Telephone Encounter (Signed)
 Pharmacy Patient Advocate Encounter  Received notification from [facility] that Prior Authorization for REPATHA  has been APPROVED from 03/01/24 to 05/30/25. Ran test claim, Copay is $133.60. This test claim was processed through [facility]- copay amounts may vary at other pharmacies due to pharmacy/plan contracts, or as the patient moves through the different stages of their insurance plan.

## 2024-05-30 NOTE — Progress Notes (Unsigned)
 Patient ID: Andrea Buck                 DOB: February 03, 1946                    MRN: [id number]      HPI: Andrea Buck is a 78 y.o. female patient referred to lipid clinic by Dr.Patwardhan. PMH is significant for hypertension, hyperlipidemia, prediabetes, SVT, syncope   The patient presented today for a lipid clinic follow-up. Her LDL decreased from 181 to 860 mg/dL on Crestor  40 mg daily. She reports tolerating the 40 mg dose well without any adverse effects. Previously, she was on Crestor  20 mg, which did not adequately lower her LDL; the increase to 40 mg has resulted in modest improvement. The patient notes her appetite is generally small--she occasionally snacks and follows a healthy diet on some days. She is not currently exercising but is motivated to begin going to the gym. We reviewed additional LDL-lowering options including ezetimibe, PCSK9 inhibitors, bempedoic acid, and inclisiran. Mechanisms of action, dosing schedules, side effects, and expected LDL-C reduction were discussed in detail. Cost and potential patient assistance programs were also reviewed. Current Medications: Crestor  40 mg daily  Intolerances: Crestor  higher dose  Risk Factors: hypertension, hyperlipidemia, prediabetes LDL goal: <70 mg/dl  Recent lab 01/7973: Chol 201, TG 68, HDL 50, LDL 139 while on Crestor  5 mg baseline LDL off of therapy was 181  HbA1C 6.0  Diet: eats very little  Eats only once a day - healthy one meal per day  Snack: chips and dip some days   Exercise:  None - motivated to start walking and will join gym   Family History:  Relation Problem Comments  Mother (Deceased at age 18) Cancer Uterus  Diabetes   Hypertension     Father (Deceased at age 92) Diabetes   Heart disease   Hypertension   Stroke     Sister (Deceased) Diabetes   Hypertension   Obesity     Sister Metallurgist) Diabetes   Hypertension     Sister Metallurgist) Diabetes   Hypertension     Sister Metallurgist)   Brother Metallurgist)  Diabetes     Social History:  Alcohol:none  Smoking: never  Labs:  Lipid Panel     Component Value Date/Time   CHOL 201 (H) 03/15/2024 1048   TRIG 68 03/15/2024 1048   HDL 50 03/15/2024 1048   CHOLHDL 4.0 03/15/2024 1048   CHOLHDL 2.9 10/29/2016 1601   VLDL 21 10/29/2016 1601   LDLCALC 139 (H) 03/15/2024 1048   LABVLDL 12 03/15/2024 1048    Past Medical History:  Diagnosis Date   Acute cystitis with hematuria 07/22/2018   Anxiety and depression 09/06/2018   Blood transfusion    after hysterectomy   Bronchitis, chronic (HCC) 2005   Carpal tunnel syndrome 12/24/2006   Qualifier: Diagnosis of  By: Koval Pharm D, Peter     CHOLELITHIASIS 04/25/2009   Qualifier: Diagnosis of  By: Joyice MD, Jessica     COPD (chronic obstructive pulmonary disease) (HCC)    Depression    GASTROESOPHAGEAL REFLUX, NO ESOPHAGITIS 12/24/2006   Qualifier: Diagnosis of  By: Koval Pharm D, Peter     GLOMUS TUMOR 2001   Qualifier: Diagnosis of  By: Joyice MD, Jessica     Hyperlipidemia    Hypertension    MENOPAUSAL SYNDROME 12/24/2006   Qualifier: Diagnosis of  By: Koval Pharm D, Peter     NECK MASS  12/29/2006   Qualifier: Diagnosis of  By: Georgina CMA, [place]     Sciatica 02/10/2017   Screening for hypothyroidism 07/21/2017   Vocal cord paralysis 05/26/2011   RIGHT vocal cord parlaysis Seen by Dr,. CANDIE Franchot silvan at [facility].( May 03, 2011)  Placed on scopalamine patch and w f/u [facility]     Current Outpatient Medications on File Prior to Visit  Medication Sig Dispense Refill   albuterol  (VENTOLIN  HFA) 108 (90 Base) MCG/ACT inhaler Inhale 2 puffs into the lungs every 4 (four) hours as needed for wheezing or shortness of breath. 18 g 0   famotidine  (PEPCID ) 20 MG tablet TAKE 1 TABLET BY MOUTH ONCE DAILY AFTER SUPPER 30 tablet 1   flecainide  (TAMBOCOR ) 50 MG tablet Take 1 tablet (50 mg total) by mouth 2 (two) times daily. 180 tablet 3   fluticasone  (FLONASE ) 50 MCG/ACT nasal spray Place  2 sprays into both nostrils 2 (two) times daily. 16 g 6   levocetirizine (XYZAL ) 5 MG tablet Take 5 mg by mouth daily.     losartan  (COZAAR ) 25 MG tablet Take 1 tablet (25 mg total) by mouth at bedtime. 100 tablet 2   meloxicam  (MOBIC ) 15 MG tablet Take 1 tablet by mouth once daily 30 tablet 0   metoprolol  succinate (TOPROL -XL) 25 MG 24 hr tablet Take 1 tablet (25 mg total) by mouth daily. Take with or immediately following a meal. 30 tablet 5   Multiple Vitamin (MULTI-VITAMINS) TABS Take by mouth.     pantoprazole  (PROTONIX ) 40 MG tablet TAKE 1 TABLET BY MOUTH ONCE DAILY 30-60  MINUTES  BEFORE  FIRST  MEAL  OF  THE  DAY 30 tablet 0   rosuvastatin  (CRESTOR ) 40 MG tablet Take 1 tablet (40 mg total) by mouth daily. 90 tablet 1   sertraline  (ZOLOFT ) 100 MG tablet Take 1.5 tablets (150 mg total) by mouth at bedtime. 135 tablet 1   traZODone  (DESYREL ) 50 MG tablet Take 1 tablet (50 mg total) by mouth at bedtime. 90 tablet 1   Vibegron (GEMTESA) 75 MG TABS Take 1 mg by mouth.     EQ ALLERGY RELIEF, CETIRIZINE , 10 MG tablet Take 1 tablet by mouth once daily (Patient not taking: Reported on 05/31/2024) 90 tablet 0   mometasone  (NASONEX ) 50 MCG/ACT nasal spray Place 1 spray into the nose daily. 17 g 3   No current facility-administered medications on file prior to visit.    No Known Allergies  Assessment/Plan:  1. Hyperlipidemia -  Problem  Mixed Hyperlipidemia   Mixed hyperlipidemia Assessment and Plan:  LDL is not at goal on Crestor  40 gm daily dose. Tolerates statins well. Risk factors includes hypertension, hyperlipidemia, prediabetes, family hx of ASCVD so LDL goal <70 mg/dl. Last LDL on high intensity statin 139 mg/dl reasonable to go for PCSK9i agent over Zetia given only ~20% LDL lowering effect of Zetia. PA for Repatha  has been approved, patient to start using Repatha  140 gm every 14 days and continue taking Crestor  40 mg daily. Will repeat lab end of Oct. In future can add Zetia and/or  bempedoic acid if LDL still remains above the goal     Thank you,  Robbi Blanch, Pharm.D [facility]. [facility] [address], [place], [place] [address] Phone: [phone]; Fax: [phone]

## 2024-05-30 NOTE — Telephone Encounter (Signed)
 PA request has been Submitted. New Encounter has been or will be created for follow up. For additional info see Pharmacy Prior Auth telephone encounter from 05/30/24.

## 2024-05-30 NOTE — Telephone Encounter (Signed)
 Pharmacy Patient Advocate Encounter   Received notification from Physician's Office that prior authorization for REPATHA  is required/requested.   Insurance verification completed.   The patient is insured through [facility] .   Per test claim: PA required; PA submitted to above mentioned insurance via CoverMyMeds Key/confirmation #/[id number] [facility] Status is pending

## 2024-05-31 ENCOUNTER — Encounter: Payer: Self-pay | Admitting: Pharmacist

## 2024-05-31 ENCOUNTER — Other Ambulatory Visit (HOSPITAL_COMMUNITY): Payer: Self-pay

## 2024-05-31 ENCOUNTER — Ambulatory Visit: Attending: Cardiovascular Disease | Admitting: Pharmacist

## 2024-05-31 ENCOUNTER — Telehealth: Payer: Self-pay | Admitting: Pharmacy Technician

## 2024-05-31 ENCOUNTER — Ambulatory Visit

## 2024-05-31 VITALS — HR 68 | Ht 65.0 in | Wt 193.2 lb

## 2024-05-31 DIAGNOSIS — Z79899 Other long term (current) drug therapy: Secondary | ICD-10-CM

## 2024-05-31 DIAGNOSIS — Z5181 Encounter for therapeutic drug level monitoring: Secondary | ICD-10-CM

## 2024-05-31 DIAGNOSIS — E782 Mixed hyperlipidemia: Secondary | ICD-10-CM | POA: Diagnosis not present

## 2024-05-31 MED ORDER — REPATHA SURECLICK 140 MG/ML ~~LOC~~ SOAJ
140.0000 mg | SUBCUTANEOUS | 3 refills | Status: DC
Start: 2024-05-31 — End: 2024-07-27
  Filled 2024-05-31: qty 6, 84d supply, fill #0

## 2024-05-31 NOTE — Patient Instructions (Signed)
 Your Results:             Your most recent labs Goal  Total Cholesterol 201 < 200  Triglycerides 68 < 150  HDL (happy/good cholesterol) 50 > 40  LDL (lousy/bad cholesterol 139 < 70   Medication changes: continue taking rosuvastatin  40 mg daily and start taking Repatha  140 mg every 14 days     Repatha  is a cholesterol medication that improved your body's ability to get rid of bad cholesterol known as LDL. It can lower your LDL up to 60%! It is an injection that is given under the skin every 2 weeks. The medication often requires a prior authorization from your insurance company.  The most common side effects of Repatha  include runny nose, symptoms of the common cold, rarely flu or flu-like symptoms, back/muscle pain in about 3-4% of the patients, and redness, pain, or bruising at the injection site.   Lab orders: We want to repeat labs after 2-3 months.  We will send you a lab order to remind you once we get closer to that time.

## 2024-05-31 NOTE — Assessment & Plan Note (Signed)
 Assessment and Plan:  LDL is not at goal on Crestor  40 gm daily dose. Tolerates statins well. Risk factors includes hypertension, hyperlipidemia, prediabetes, family hx of ASCVD so LDL goal <70 mg/dl. Last LDL on high intensity statin 139 mg/dl reasonable to go for PCSK9i agent over Zetia given only ~20% LDL lowering effect of Zetia. PA for Repatha  has been approved, patient to start using Repatha  140 gm every 14 days and continue taking Crestor  40 mg daily. Will repeat lab end of Oct. In future can add Zetia and/or bempedoic acid if LDL still remains above the goal

## 2024-05-31 NOTE — Addendum Note (Signed)
 Addended by: Candida Vetter C on: 05/31/2024 03:37 PM   Modules accepted: Level of Service

## 2024-05-31 NOTE — Telephone Encounter (Signed)
 Patient Advocate Encounter   The patient was approved for a [employer] grant that will help cover the cost of repatha  Total amount awarded, 2500.00.  Effective: 05/01/24 - 04/30/25   APW:[id number] ERW:[id number] Hmnle:[id number] PI:[id number]  [employer] ID: [id number]   Pharmacy provided with approval and processing information.

## 2024-05-31 NOTE — Patient Instructions (Signed)
 Medication Instructions:  Your physician recommends that you continue on your current medications as directed. Please refer to the Current Medication list given to you today.  *If you need a refill on your cardiac medications before your next appointment, please call your pharmacy*    Follow-Up: At [facility], you and your health needs are our priority.  As part of our continuing mission to provide you with exceptional heart care, our providers are all part of one team.  This team includes your primary Cardiologist (physician) and Advanced Practice Providers or APPs (Physician Assistants and Nurse Practitioners) who all work together to provide you with the care you need, when you need it.  Your next appointment:    As scheduled   Provider:   Newman JINNY Lawrence, MD

## 2024-05-31 NOTE — Progress Notes (Signed)
   Nurse Visit   Date of Encounter: 05/31/2024 ID: GEOVANNA SIMKO, DOB 09/17/1946, MRN [id number]  PCP:  Chandra Toribio POUR, MD   [facility] Providers Cardiologist:  Newman JINNY Lawrence, MD      Visit Details   VS:  Pulse 68   Ht 5' 5 (1.651 m)   Wt 193 lb 4 oz (87.7 kg)   SpO2 97%   BMI 32.16 kg/m  , BMI Body mass index is 32.16 kg/m.  Wt Readings from Last 3 Encounters:  05/31/24 193 lb 4 oz (87.7 kg)  05/06/24 194 lb (88 kg)  04/21/24 189 lb (85.7 kg)     Reason for visit: Follow up EKG Performed today: Vitals, EKG, Provider consulted and education given. Changes (medications, testing, etc.) : None Length of Visit: 10 minutes    Medications Adjustments/Labs and Tests Ordered: Orders Placed This Encounter  Procedures   EKG 12-Lead   No orders of the defined types were placed in this encounter.    Signed, Rolin CHRISTELLA Qualia, RN  05/31/2024 2:16 PM

## 2024-06-01 ENCOUNTER — Other Ambulatory Visit: Payer: Self-pay | Admitting: Family Medicine

## 2024-06-01 DIAGNOSIS — E785 Hyperlipidemia, unspecified: Secondary | ICD-10-CM

## 2024-06-01 MED ORDER — ROSUVASTATIN CALCIUM 40 MG PO TABS: 40.0000 mg | ORAL_TABLET | Freq: Every day | ORAL | 3 refills | Status: DC

## 2024-06-09 ENCOUNTER — Other Ambulatory Visit: Payer: Self-pay | Admitting: Family Medicine

## 2024-07-19 ENCOUNTER — Other Ambulatory Visit

## 2024-07-19 ENCOUNTER — Other Ambulatory Visit: Payer: Self-pay | Admitting: Family Medicine

## 2024-07-19 DIAGNOSIS — E782 Mixed hyperlipidemia: Secondary | ICD-10-CM | POA: Diagnosis not present

## 2024-07-19 DIAGNOSIS — R7303 Prediabetes: Secondary | ICD-10-CM

## 2024-07-19 DIAGNOSIS — E785 Hyperlipidemia, unspecified: Secondary | ICD-10-CM | POA: Diagnosis not present

## 2024-07-19 DIAGNOSIS — I10 Essential (primary) hypertension: Secondary | ICD-10-CM

## 2024-07-19 DIAGNOSIS — J4489 Other specified chronic obstructive pulmonary disease: Secondary | ICD-10-CM

## 2024-07-19 DIAGNOSIS — F331 Major depressive disorder, recurrent, moderate: Secondary | ICD-10-CM

## 2024-07-19 DIAGNOSIS — G479 Sleep disorder, unspecified: Secondary | ICD-10-CM

## 2024-07-19 NOTE — Telephone Encounter (Unsigned)
 Copied from CRM [id number]. Topic: Clinical - Medication Refill >> Jul 19, 2024  9:49 AM Wess RAMAN wrote: Medication: rosuvastatin  (CRESTOR ) 40 MG tablet  pantoprazole  (PROTONIX ) 40 MG tablet  famotidine  (PEPCID ) 20 MG tablet  meloxicam  (MOBIC ) 15 MG tablet  losartan  (COZAAR ) 25 MG tablet  sertraline  (ZOLOFT ) 100 MG tablet  traZODone  (DESYREL ) 50 MG tablet  fluticasone  (FLONASE ) 50 MCG/ACT nasal spray   Has the patient contacted their pharmacy? Yes (Agent: If no, request that the patient contact the pharmacy for the refill. If patient does not wish to contact the pharmacy document the reason why and proceed with request.) (Agent: If yes, when and what did the pharmacy advise?)  This is the patient's preferred pharmacy:  [facility] [address] ([place]) - [place], [place] - [address] [place] [address] Phone: [phone] Fax: [phone] Hours: Not open 24 hours   Is this the correct pharmacy for this prescription? Yes If no, delete pharmacy and type the correct one.   Has the prescription been filled recently? Yes  Is the patient out of the medication? No  Has the patient been seen for an appointment in the last year OR does the patient have an upcoming appointment? Yes  Can we respond through MyChart? Yes  Agent: Please be advised that Rx refills may take up to 3 business days. We ask that you follow-up with your pharmacy.

## 2024-07-19 NOTE — Addendum Note (Signed)
 Addended by: CHANDRA TORIBIO POUR on: 07/19/2024 09:33 AM   Modules accepted: Orders

## 2024-07-20 ENCOUNTER — Ambulatory Visit (INDEPENDENT_AMBULATORY_CARE_PROVIDER_SITE_OTHER): Admitting: Otolaryngology

## 2024-07-20 ENCOUNTER — Encounter (INDEPENDENT_AMBULATORY_CARE_PROVIDER_SITE_OTHER): Payer: Self-pay | Admitting: Otolaryngology

## 2024-07-20 ENCOUNTER — Other Ambulatory Visit (HOSPITAL_COMMUNITY): Payer: Self-pay

## 2024-07-20 VITALS — BP 148/80 | HR 57 | Temp 98.3°F | Ht 65.0 in | Wt 190.0 lb

## 2024-07-20 DIAGNOSIS — G510 Bell's palsy: Secondary | ICD-10-CM

## 2024-07-20 DIAGNOSIS — R1312 Dysphagia, oropharyngeal phase: Secondary | ICD-10-CM

## 2024-07-20 DIAGNOSIS — R0982 Postnasal drip: Secondary | ICD-10-CM

## 2024-07-20 DIAGNOSIS — K148 Other diseases of tongue: Secondary | ICD-10-CM

## 2024-07-20 DIAGNOSIS — G522 Disorders of vagus nerve: Secondary | ICD-10-CM

## 2024-07-20 DIAGNOSIS — J3801 Paralysis of vocal cords and larynx, unilateral: Secondary | ICD-10-CM | POA: Diagnosis not present

## 2024-07-20 DIAGNOSIS — R490 Dysphonia: Secondary | ICD-10-CM

## 2024-07-20 DIAGNOSIS — J3089 Other allergic rhinitis: Secondary | ICD-10-CM

## 2024-07-20 DIAGNOSIS — R0981 Nasal congestion: Secondary | ICD-10-CM

## 2024-07-20 LAB — COMPREHENSIVE METABOLIC PANEL WITH GFR
ALT: 6 IU/L (ref 0–32)
AST: 15 IU/L (ref 0–40)
Albumin: 3.8 g/dL (ref 3.8–4.8)
Alkaline Phosphatase: 63 IU/L (ref 49–135)
BUN/Creatinine Ratio: 15 (ref 12–28)
BUN: 14 mg/dL (ref 8–27)
Bilirubin Total: 0.4 mg/dL (ref 0.0–1.2)
CO2: 22 mmol/L (ref 20–29)
Calcium: 9 mg/dL (ref 8.7–10.3)
Chloride: 106 mmol/L (ref 96–106)
Creatinine, Ser: 0.96 mg/dL (ref 0.57–1.00)
Globulin, Total: 2.2 g/dL (ref 1.5–4.5)
Glucose: 85 mg/dL (ref 70–99)
Potassium: 3.9 mmol/L (ref 3.5–5.2)
Sodium: 142 mmol/L (ref 134–144)
Total Protein: 6 g/dL (ref 6.0–8.5)
eGFR: 61 mL/min/1.73 (ref >59)

## 2024-07-20 LAB — LIPID PANEL
Chol/HDL Ratio: 1.7 ratio (ref 0.0–4.4)
Cholesterol, Total: 74 mg/dL — ABNORMAL LOW (ref 100–199)
HDL: 44 mg/dL (ref >39)
LDL Chol Calc (NIH): 17 mg/dL (ref 0–99)
Triglycerides: 46 mg/dL (ref 0–149)
VLDL Cholesterol Cal: 13 mg/dL (ref 5–40)

## 2024-07-20 LAB — HEMOGLOBIN A1C
Est. average glucose Bld gHb Est-mCnc: 126 mg/dL
Hgb A1c MFr Bld: 6 % — ABNORMAL HIGH (ref 4.8–5.6)

## 2024-07-20 MED ORDER — FLUTICASONE PROPIONATE 50 MCG/ACT NA SUSP: 2.0000 | Freq: Two times a day (BID) | NASAL | 6 refills | Status: DC

## 2024-07-20 MED ORDER — MELOXICAM 15 MG PO TABS: 15.0000 mg | ORAL_TABLET | Freq: Every day | ORAL | 3 refills | Status: DC

## 2024-07-20 MED ORDER — PANTOPRAZOLE SODIUM 40 MG PO TBEC: DELAYED_RELEASE_TABLET | ORAL | 1 refills | Status: DC

## 2024-07-20 MED ORDER — FAMOTIDINE 20 MG PO TABS: ORAL_TABLET | ORAL | 1 refills | Status: DC

## 2024-07-20 MED ORDER — TRAZODONE HCL 50 MG PO TABS: 50.0000 mg | ORAL_TABLET | Freq: Every day | ORAL | 1 refills | Status: DC

## 2024-07-20 MED ORDER — LOSARTAN POTASSIUM 25 MG PO TABS: 25.0000 mg | ORAL_TABLET | Freq: Every day | ORAL | 1 refills | Status: DC

## 2024-07-20 MED ORDER — SERTRALINE HCL 100 MG PO TABS: 150.0000 mg | ORAL_TABLET | Freq: Every day | ORAL | 1 refills | Status: DC

## 2024-07-20 MED ORDER — ROSUVASTATIN CALCIUM 40 MG PO TABS: 40.0000 mg | ORAL_TABLET | Freq: Every day | ORAL | 3 refills | Status: DC

## 2024-07-20 NOTE — Addendum Note (Signed)
 Addended by: Janos Shampine on: 07/20/2024 02:45 PM   Modules accepted: Orders

## 2024-07-20 NOTE — Patient Instructions (Signed)

## 2024-07-20 NOTE — Progress Notes (Signed)
 ENT Progress Note:   Update 07/20/2024  Discussed the use of AI scribe software for clinical note transcription with the patient, who gave verbal consent to proceed.  History of Present Illness Andrea Buck is a 78 year old female who presents for follow-up regarding voice treatment options.  She has not attended speech therapy due to other health issues, specifically heart problems, and multiple doctor appointments. She is interested in pursuing a previously suggested treatment for her voice but wants to wait until her heart issues are resolved.  She notes variability in her voice, stating that sometimes it sounds good, but at other times she is 'whispering'. She is considering a procedure that involves a nebulizer treatment and injection of gel into the vocal cords.  Her past treatments included fat injections under general anesthesia approximately fifteen years ago, with four or five injections in total. She experienced reactions to the first two injections. The fat injection was intended to be a more permanent solution, but it did not fully resolve her voice issues.  Her voice problems tend to worsen in the fall, and she has not had a chance to relax due to frequent medical appointments.  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  She has a history of vocal cord paralysis/palate and pharyngeal weakness following surgery for a R glomus tumor approximately 10 to 12 years ago at [facility], which resulted in  high-vagal injury. This led to R vocal cord paralysis/soft palate/pharyngeal weakness and slight facial drooping. No feeding tube was required post-surgery, contrary to initial predictions.  She has undergone multiple procedures to address her vocal cord issues, including a Restylane injection in 2013, Prolaren Plus in 2016, and a fat injection in 2024. The fat injection provided more prolonged improvement compared to previous treatments, which caused voice fluctuations and respiratory discomfort.  Currently, she experiences frequent vocal fatigue, coughing when drinking liquids, and accumulation of secretions. Her voice has declined over time, and she has been experiencing unexpected difficulties with swallowing, particularly when not sitting upright. No history of stroke or heart attack and not on blood thinners.  She is currently taking medications for reflux, including pantoprazole  and Pepsid. She also reports issues with nasal drainage and has not been using any medication for it.     Records Reviewed:  11/18/23 PCP office visit  Patient states that she has had trouble getting some of her medications.  She mentions meloxicam  but is unable to think of other medications at this time.   She has not been taking vitamin D  since we recommended switching from weekly to daily vitamin D .   Patient has been taking the 40 mg of Crestor .  She was doubling her dose of 20 mg Crestor  after we asked her to.  States her last prescription was for 40 mg, but I do not see any record of her being prescribed a 40 mg tablet.   Syncope-patient has not had any more syncopal episodes or presyncopal episodes.  She states she has not heard from anybody from cardiology yet.   Vocal cord dysfunction-patient  goes to Dr. Brien with [facility] yearly.  She does not want to have anymore procedures on her vocal cords at this time due to concerns about getting general anesthesia.  She would like to go to somebody closer for  local rehabilitation.  Does not like having to drive to [facility] for this.   Patient has occasional pain radiating down the right hip to the foot.  Believes it occurred after she fell right before Christmas.  Notices the pain is worse when she bends over.  Puts heating pad on it and takes Tylenol as needed.  Dr Brien [facility]  Associated Order(s): Laryngoscopy Flexible Diagnostic Post-Procedure Diagnose(s): Dysphagia, oropharyngeal phase; Paralysis of vocal cords and larynx, unilateral Formatting of this note is different from the original. 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 Floretta (2005). I injected her vocal folds in 2013 (Restylane) and in 2016 (Prolaryn Plus). In 2022 she developed worsening dysphonia and vocal fatigue - a problem with her geriatric patients and her dysarthria.  I took her for an injection augmentation using autologous fat in March 2022 (Dedo, straightforward, intraop VF hematoma)  Since the last clinic visit: She mentions that her voice has slowly been worsening for the last year. She also has had a challenging few years her sister has a stroke about 8 months ago and she has been in [facility] taking care of her. About 1 year ago her grandson passed. She presents today for her annual checkup and to see if anything can be done to improve her voice. She notes that she is very quiet and does not have enough vocal power.     Past Medical History:  Diagnosis Date   Acute cystitis with hematuria 07/22/2018   Anxiety and depression 09/06/2018   Blood transfusion    after hysterectomy   Bronchitis, chronic (HCC) 2005   Carpal tunnel syndrome 12/24/2006   Qualifier: Diagnosis of  By: Koval Pharm D, Peter     CHOLELITHIASIS 04/25/2009   Qualifier: Diagnosis of  By: Joyice MD, Jessica      COPD (chronic obstructive pulmonary disease) (HCC)    Depression    GASTROESOPHAGEAL REFLUX, NO ESOPHAGITIS 12/24/2006   Qualifier: Diagnosis of  By: Amalia Berdine JONETTA Maude LAURIE TUMOR 2001   Qualifier: Diagnosis of  By: Joyice MD, Jessica     Hyperlipidemia    Hypertension    MENOPAUSAL SYNDROME 12/24/2006   Qualifier: Diagnosis of  By: Amalia Berdine JONETTA Maude     NECK MASS 12/29/2006   Qualifier: Diagnosis of  By: Georgina CMA, Neeton     Sciatica 02/10/2017   Screening for hypothyroidism 07/21/2017   Vocal cord paralysis 05/26/2011   RIGHT vocal cord parlaysis Seen by Dr,. CANDIE Franchot brien at [place].( May 03, 2011)  Placed on scopalamine patch and w f/u [facility]     Past Surgical History:  Procedure Laterality Date   ABDOMINAL HYSTERECTOMY     CARPAL TUNNEL RELEASE     CHOLECYSTECTOMY     LUMBAR LAMINECTOMY     (4-5)   palatal adhesion     removal of glomus vagales tumor-non cancerious     TUBAL LIGATION     UPPER GASTROINTESTINAL ENDOSCOPY  2006    Family History  Problem Relation Age of Onset  Cancer Mother        Uterus   Diabetes Mother    Hypertension Mother    Heart disease Father    Stroke Father    Hypertension Father    Diabetes Father    Obesity Sister    Diabetes Sister    Hypertension Sister    Diabetes Sister    Hypertension Sister    Diabetes Sister    Hypertension Sister    Diabetes Brother    Multiple sclerosis Brother    Diabetes Brother    Hypertension Brother    Mental illness Son        PTSD   Diabetes Son    Hypertension Son    Hyperlipidemia Son    Obesity Son    Drug abuse Son    Arthritis Son    Drug abuse Son    Hyperlipidemia Son    Colon cancer Neg Hx    Colon polyps Neg Hx    Stomach cancer Neg Hx    Esophageal cancer Neg Hx    Rectal cancer Neg Hx     Social History:  reports that she has never smoked. She has never been exposed to tobacco smoke. She has never used smokeless tobacco. She reports that  she does not drink alcohol and does not use drugs.  Allergies: No Known Allergies  Medications: I have reviewed the patient's current medications.  The PMH, PSH, Medications, Allergies, and SH were reviewed and updated.  ROS: Constitutional: Negative for fever, weight loss and weight gain. Cardiovascular: Negative for chest pain and dyspnea on exertion. Respiratory: Is not experiencing shortness of breath at rest. Gastrointestinal: Negative for nausea and vomiting. Neurological: Negative for headaches. Psychiatric: The patient is not nervous/anxious  Blood pressure (!) 148/80, pulse (!) 57, temperature 98.3 F (36.8 C), temperature source Oral, height 5' 5 (1.651 m), weight 190 lb (86.2 kg), SpO2 97%. Body mass index is 31.62 kg/m.  PHYSICAL EXAM:  Exam: General: Well-developed, well-nourished Communication and Voice: raspy slightly breathy at times Respiratory Respiratory effort: Equal inspiration and expiration without stridor Cardiovascular Peripheral Vascular: Warm extremities with equal color/perfusion Eyes: No nystagmus with equal extraocular motion bilaterally Neuro/Psych/Balance: Patient oriented to person, place, and time; Appropriate mood and affect; Gait is intact with no imbalance; Cranial nerves with evidence decreased soft palate movement pharyngeal movement on the right, R Facial weakness (HB 2) and R VF paralysis on scope exam R CN XII weakness as well with accompanying tongue atrophy on the right side  Inspection: Normocephalic and atraumatic without mass or lesion Palpation: Facial skeleton intact without bony stepoffs Salivary Glands: No mass or tenderness Facial Strength: Facial motility with mild weakness on the right side HB 2 ENT Pinna: External ear intact and fully developed External canal: Canal is patent with intact skin Tympanic Membrane: Clear and mobile External Nose: No scar or anatomic deformity Internal Nose: Septum is deviated to the left. No  polyp, or purulence. Mucosal edema and erythema present.  Bilateral inferior turbinate hypertrophy.  Lips, Teeth, and gums: Mucosa and teeth intact and viable TMJ: No pain to palpation with full mobility Oral cavity/oropharynx: No erythema or exudate, no lesions present Neck Neck and Trachea: Midline trachea without mass or lesion Thyroid : No mass or nodularity Lymphatics: No lymphadenopathy Studies Reviewed: CXR 03/24/23 FINDINGS: Cardiomediastinal silhouette unchanged in size and contour. No evidence of central vascular congestion. No interlobular septal thickening.   No pneumothorax or pleural effusion. Coarsened interstitial markings, with no confluent airspace disease.  No acute displaced fracture. Degenerative changes of the spine.   IMPRESSION: No active cardiopulmonary disease  Assessment/Plan: Encounter Diagnoses  Name Primary?   Dysphonia Yes   Oropharyngeal dysphagia    Paralysis of right vocal fold    Facial palsy    Tongue paralysis    Vagus nerve palate paralysis    Chronic nasal congestion    Post-nasal drip    Environmental and seasonal allergies      Assessment and Plan Assessment & Plan Dysphonia/Vocal cord paralysis following high-vagal injury after right glomus tumor resection 2005 Dr Joyceann.  Chronic right vocal cord paralysis due to vagal nerve injury from past glomus tumor surgery. Multiple procedures done in the past to rehabilitate voice and swallowing including injection augmentation and fat injection under general anesthesia  12/2020.  She is here 2/2 vocal fatigue and her scope exam demonstrated the following SCOPE FINDINGS: surgical changes at the soft palate on the right side likely from prior palatoplasty/pharyngoplasty, right VF paralysis on the right, without lesion or mass. There appears to be evidence of incomplete closure with adduction but edges of both VF appear straight and there seems to be evidence of infraglottic  augmentation noted on the left side could be from previous fat graft. Glottic gap is small. There is a collapse of the R AE fold likely sequelae of high-vagal injury years ago. Scant secretions in pyriforms.   We discussed repeat injection augmentation and thyroplasty. She is hesitant about invasive procedures due to anesthesia risks and age, lack of improvement with prior surgeries. Restylane injection in the office can be considered in the future, but may not significantly improve voice and her altered laryngeal anatomy with collapse of AE fold on the right could potentially make it challenging to complete - Refer to local speech therapy for voice and swallow therapy. - Provide information sheet about Restylane procedure for consideration. - Prescribe nighttime allergy pill and Flonase  twice daily for nasal drainage/phlegm (could potentially improve her voice). - Recommend seaweed paste Reflux Gourmet supplement for reflux control.  Chronic nasal congestion and Post-nasal drip Contributes to throat discomfort and mucus accumulation. - Xyzal  5 mg daily and Flonase  twice daily for nasal drainage.  Gastroesophageal reflux disease (GERD) LPR Potential contributor to throat secretions and voice changes. Managed with pantoprazole  and Pepsid. - continue current reflux medications -  Reflux Gourmet after meals - diet and lifestyle changes to minimize GERD - Refer to [employer] blog for dietary and lifestyle modifications/reflux cook book  Nasolacrimal duct obstruction Chronic tear drainage issue with fluid accumulation on the right side. Previous surgical referral not completed due to insurance issues. - Advise seeking care at an academic hospital with an ophthalmology department accepting all insurances.  Update 07/20/24 High Vagal injury R VF paralysis  We discussed injection augmentation today and she would like to try it early next year after addressing other health issues.  - Schedule  office-based Restylane injection augmentation for January. - Provided handout on procedure. - Discussed potential for permanent implant if needed post-procedure.   Elena Larry, MD Otolaryngology [facility] Phone: [phone] Fax: [phone]    07/20/2024, 2:19 PM

## 2024-07-21 ENCOUNTER — Other Ambulatory Visit: Payer: Self-pay

## 2024-07-21 DIAGNOSIS — I471 Supraventricular tachycardia, unspecified: Secondary | ICD-10-CM

## 2024-07-21 MED ORDER — FLECAINIDE ACETATE 50 MG PO TABS: 50.0000 mg | ORAL_TABLET | Freq: Two times a day (BID) | ORAL | 1 refills | Status: AC

## 2024-07-21 MED ORDER — METOPROLOL SUCCINATE ER 25 MG PO TB24: 25.0000 mg | ORAL_TABLET | Freq: Every day | ORAL | 5 refills | Status: AC

## 2024-07-26 ENCOUNTER — Ambulatory Visit: Admitting: Family Medicine

## 2024-07-26 ENCOUNTER — Other Ambulatory Visit (HOSPITAL_COMMUNITY): Payer: Self-pay

## 2024-07-27 ENCOUNTER — Other Ambulatory Visit: Payer: Self-pay | Admitting: Pharmacist

## 2024-07-27 ENCOUNTER — Ambulatory Visit: Admitting: Family Medicine

## 2024-07-27 ENCOUNTER — Encounter: Payer: Self-pay | Admitting: Family Medicine

## 2024-07-27 VITALS — BP 130/70 | HR 55 | Ht 65.0 in | Wt 193.1 lb

## 2024-07-27 DIAGNOSIS — Z23 Encounter for immunization: Secondary | ICD-10-CM | POA: Diagnosis not present

## 2024-07-27 DIAGNOSIS — E66811 Obesity, class 1: Secondary | ICD-10-CM

## 2024-07-27 DIAGNOSIS — M19011 Primary osteoarthritis, right shoulder: Secondary | ICD-10-CM

## 2024-07-27 DIAGNOSIS — E559 Vitamin D deficiency, unspecified: Secondary | ICD-10-CM | POA: Diagnosis not present

## 2024-07-27 DIAGNOSIS — I10 Essential (primary) hypertension: Secondary | ICD-10-CM

## 2024-07-27 DIAGNOSIS — I471 Supraventricular tachycardia, unspecified: Secondary | ICD-10-CM

## 2024-07-27 DIAGNOSIS — E782 Mixed hyperlipidemia: Secondary | ICD-10-CM | POA: Diagnosis not present

## 2024-07-27 DIAGNOSIS — K219 Gastro-esophageal reflux disease without esophagitis: Secondary | ICD-10-CM | POA: Diagnosis not present

## 2024-07-27 DIAGNOSIS — Z0000 Encounter for general adult medical examination without abnormal findings: Secondary | ICD-10-CM

## 2024-07-27 DIAGNOSIS — Z6832 Body mass index (BMI) 32.0-32.9, adult: Secondary | ICD-10-CM

## 2024-07-27 MED ORDER — REPATHA SURECLICK 140 MG/ML ~~LOC~~ SOAJ: 140.0000 mg | SUBCUTANEOUS | 1 refills | Status: DC

## 2024-07-27 NOTE — Assessment & Plan Note (Signed)
-   Started on flecainide  by cardiology. Reports resolution of fainting and significant improvement in dizziness. - Plan: Continue flecainide  and metoprolol  as prescribed by cardiology.

## 2024-07-27 NOTE — Patient Instructions (Signed)
 It was nice to see you today,  We addressed the following topics today: -I will [facility] - No changes to your medications today. - We will see you back in 6 months  Have a great day,  Rolan Slain, MD

## 2024-07-27 NOTE — Assessment & Plan Note (Signed)
-   Colonoscopy is now overdue (last 2019). Discussed need for repeat. Advised that a referral is not needed and can call [facility] directly to schedule when ready. Will revisit discussion at next appointment. - Received influenza vaccine today.

## 2024-07-27 NOTE — Progress Notes (Unsigned)
   Established Patient Office Visit  Subjective   Patient ID: Andrea Buck, female    DOB: 24-Mar-1946  Age: 78 y.o. MRN: [id number]  Chief Complaint  Patient presents with   Medical Management of Chronic Issues    HPI Subjective - Follow-up for medication management and general check-in. Reports doing well. - No new fainting episodes. Reports significant improvement in dizzy spells, now only occurring occasionally. - Last colonoscopy was in 2019. Understands the need for a repeat but feels overwhelmed with appointments currently. Defers scheduling at this time. - Received flu shot today.  Medications Taking Repatha  injections, flecainide , Flonase  nasal spray, famotidine , losartan , meloxicam  daily, pantoprazole , Crestor , sertraline , and trazodone  as needed at night. Also taking Gemtessa. No longer taking diltiazem . Repatha  and Gemtessa are obtained directly from the manufacturer. Other medications are being switched to [facility] mail-in pharmacy; has not yet received medications from them.  PMH, PSH, FH, Social Hx PMHx: Hyperlipidemia, pre-diabetes, acid reflux, history of fainting and dizzy spells. PSH: Colonoscopy (2019).  ROS Pertinent positives: Occasional dizziness, heartburn. Pertinent negatives: No fainting.     The ASCVD Risk score (Arnett DK, et al., 2019) failed to calculate for the following reasons:   The valid total cholesterol range is 130 to 320 mg/dL  Health Maintenance Due  Topic Date Due   DTaP/Tdap/Td (2 - Tdap) 05/27/2009   Colonoscopy  07/28/2021   Medicare Annual Wellness (AWV)  12/17/2022   COVID-19 Vaccine (7 - 2025-26 season) 06/27/2024      Objective:     BP 130/70   Pulse (!) 55   Ht 5' 5 (1.651 m)   Wt 193 lb 1.9 oz (87.6 kg)   SpO2 98%   BMI 32.14 kg/m  {Vitals History (Optional):23777}  Physical Exam General: Well-appearing. In no acute distress. CV: Regular rate and rhythm, no murmurs, rubs, or gallops. PULM: Lungs clear to  auscultation bilaterally. Psych: pleasant affect   No results found for any visits on 07/27/24.      Assessment & Plan:   Gastroesophageal reflux disease, unspecified whether esophagitis present Assessment & Plan: - Continues on famotidine  and pantoprazole . A note from Dr. Okey mentioned a seaweed paste supplement (Reflux gourmet), but reports no recollection of this discussion. - Plan: Continue current medications.   SVT (supraventricular tachycardia) Assessment & Plan: - Started on flecainide  by cardiology. Reports resolution of fainting and significant improvement in dizziness. - Plan: Continue flecainide  and metoprolol  as prescribed by cardiology.   Healthcare maintenance Assessment & Plan: - Colonoscopy is now overdue (last 2019). Discussed need for repeat. Advised that a referral is not needed and can call [facility] directly to schedule when ready. Will revisit discussion at next appointment. - Received influenza vaccine today.   Mixed hyperlipidemia Assessment & Plan: Hyperlipidemia - Started Repatha  since last visit. Recent labs show LDL cholesterol is now 17. - Plan: Continue current Repatha  dose. Will defer to cardiologist for any changes.   Immunization due -     Flu vaccine HIGH DOSE PF(Fluzone Trivalent)     Return in about 6 months (around 01/25/2025) for physical.    Toribio MARLA Slain, MD

## 2024-07-27 NOTE — Assessment & Plan Note (Signed)
 Hyperlipidemia - Started Repatha  since last visit. Recent labs show LDL cholesterol is now 17. - Plan: Continue current Repatha  dose. Will defer to cardiologist for any changes.

## 2024-07-27 NOTE — Assessment & Plan Note (Signed)
-   Continues on famotidine  and pantoprazole . A note from Dr. Okey mentioned a seaweed paste supplement (Reflux gourmet), but reports no recollection of this discussion. - Plan: Continue current medications.

## 2024-07-28 ENCOUNTER — Ambulatory Visit: Admitting: Internal Medicine

## 2024-07-28 ENCOUNTER — Other Ambulatory Visit: Payer: Self-pay | Admitting: Family Medicine

## 2024-07-28 ENCOUNTER — Telehealth: Payer: Self-pay | Admitting: Internal Medicine

## 2024-07-28 ENCOUNTER — Telehealth: Payer: Self-pay | Admitting: Family Medicine

## 2024-07-28 DIAGNOSIS — J4489 Other specified chronic obstructive pulmonary disease: Secondary | ICD-10-CM

## 2024-07-28 DIAGNOSIS — G479 Sleep disorder, unspecified: Secondary | ICD-10-CM

## 2024-07-28 DIAGNOSIS — F331 Major depressive disorder, recurrent, moderate: Secondary | ICD-10-CM

## 2024-07-28 DIAGNOSIS — E785 Hyperlipidemia, unspecified: Secondary | ICD-10-CM

## 2024-07-28 DIAGNOSIS — I10 Essential (primary) hypertension: Secondary | ICD-10-CM

## 2024-07-28 NOTE — Telephone Encounter (Unsigned)
 Copied from CRM [id number]. Topic: Clinical - Medication Refill >> Jul 28, 2024 11:56 AM Kendralyn S wrote: Medication: rosuvastatin  (CRESTOR ) 40 MG tablet  pantoprazole  (PROTONIX ) 40 MG tablet  famotidine  (PEPCID ) 20 MG tablet  losartan  (COZAAR ) 25 MG tablet  sertraline  (ZOLOFT ) 100 MG tablet  traZODone  (DESYREL ) 50 MG tablet  fluticasone  (FLONASE ) 50 MCG/ACT nasal spray  meloxicam  (MOBIC ) 15 MG tablet   Has the patient contacted their pharmacy? Yes (Agent: If no, request that the patient contact the pharmacy for the refill. If patient does not wish to contact the pharmacy document the reason why and proceed with request.) (Agent: If yes, when and what did the pharmacy advise?)  This is the patient's preferred pharmacy:   [facility] - [place] [place] - [address] [place] [address] Phone: [phone] Fax: [phone]  Is this the correct pharmacy for this prescription? Yes If no, delete pharmacy and type the correct one.   Has the prescription been filled recently? No  Is the patient out of the medication? No  Has the patient been seen for an appointment in the last year OR does the patient have an upcoming appointment? Yes  Can we respond through MyChart? No  Agent: Please be advised that Rx refills may take up to 3 business days. We ask that you follow-up with your pharmacy.

## 2024-07-28 NOTE — Telephone Encounter (Signed)
 Pt of Dr. Waddell. Does Dr. Waddell want to refill this RX? Please advise.

## 2024-07-28 NOTE — Telephone Encounter (Signed)
 Copied from CRM [id number]. Topic: Clinical - Medication Refill >> Jul 28, 2024 11:56 AM Kendralyn S wrote: Medication: rosuvastatin  (CRESTOR ) 40 MG tablet  pantoprazole  (PROTONIX ) 40 MG tablet  famotidine  (PEPCID ) 20 MG tablet  losartan  (COZAAR ) 25 MG tablet  sertraline  (ZOLOFT ) 100 MG tablet  traZODone  (DESYREL ) 50 MG tablet  fluticasone  (FLONASE ) 50 MCG/ACT nasal spray  meloxicam  (MOBIC ) 15 MG tablet   Has the patient contacted their pharmacy? Yes (Agent: If no, request that the patient contact the pharmacy for the refill. If patient does not wish to contact the pharmacy document the reason why and proceed with request.) (Agent: If yes, when and what did the pharmacy advise?)  This is the patient's preferred pharmacy:   [facility] - [place], [place] - [address] [place] [address] Phone: [phone] Fax: [phone]  Is this the correct pharmacy for this prescription? Yes If no, delete pharmacy and type the correct one.   Has the prescription been filled recently? No  Is the patient out of the medication? No  Has the patient been seen for an appointment in the last year OR does the patient have an upcoming appointment? Yes  Can we respond through MyChart? No  Agent: Please be advised that Rx refills may take up to 3 business days. We ask that you follow-up with your pharmacy.

## 2024-07-28 NOTE — Telephone Encounter (Signed)
*  STAT* If patient is at the pharmacy, call can be transferred to refill team.   1. Which medications need to be refilled? (please list name of each medication and dose if known)   levocetirizine (XYZAL ) 5 MG tablet     2. Would you like to learn more about the convenience, safety, & potential cost savings by using the [facility]? No    3. Are you open to using the [facility] (Type [facility]. No    4. Which pharmacy/location (including street and city if local pharmacy) is medication to be sent to?[facility] - [place] [address]    5. Do they need a 30 day or 90 day supply? 90 day

## 2024-07-29 ENCOUNTER — Other Ambulatory Visit: Payer: Self-pay | Admitting: Family Medicine

## 2024-07-29 DIAGNOSIS — J4489 Other specified chronic obstructive pulmonary disease: Secondary | ICD-10-CM

## 2024-07-29 DIAGNOSIS — E785 Hyperlipidemia, unspecified: Secondary | ICD-10-CM

## 2024-07-29 DIAGNOSIS — G479 Sleep disorder, unspecified: Secondary | ICD-10-CM

## 2024-07-29 DIAGNOSIS — F331 Major depressive disorder, recurrent, moderate: Secondary | ICD-10-CM

## 2024-07-29 DIAGNOSIS — I10 Essential (primary) hypertension: Secondary | ICD-10-CM

## 2024-07-29 MED ORDER — FLUTICASONE PROPIONATE 50 MCG/ACT NA SUSP: 2.0000 | Freq: Two times a day (BID) | NASAL | 11 refills | Status: DC

## 2024-07-29 MED ORDER — FAMOTIDINE 20 MG PO TABS: ORAL_TABLET | ORAL | 3 refills | Status: AC

## 2024-07-29 MED ORDER — LOSARTAN POTASSIUM 25 MG PO TABS: 25.0000 mg | ORAL_TABLET | Freq: Every day | ORAL | 3 refills | Status: AC

## 2024-07-29 MED ORDER — ROSUVASTATIN CALCIUM 40 MG PO TABS: 40.0000 mg | ORAL_TABLET | Freq: Every day | ORAL | 3 refills | Status: DC

## 2024-07-29 MED ORDER — PANTOPRAZOLE SODIUM 40 MG PO TBEC: DELAYED_RELEASE_TABLET | ORAL | 3 refills | Status: AC

## 2024-07-29 MED ORDER — SERTRALINE HCL 100 MG PO TABS: 150.0000 mg | ORAL_TABLET | Freq: Every day | ORAL | 3 refills | Status: AC

## 2024-07-29 MED ORDER — TRAZODONE HCL 50 MG PO TABS: 50.0000 mg | ORAL_TABLET | Freq: Every day | ORAL | 3 refills | Status: AC

## 2024-07-29 NOTE — Telephone Encounter (Signed)
 RX must come from PCP

## 2024-08-11 ENCOUNTER — Encounter: Payer: Self-pay | Admitting: Internal Medicine

## 2024-08-11 ENCOUNTER — Other Ambulatory Visit (HOSPITAL_COMMUNITY): Payer: Self-pay

## 2024-08-11 ENCOUNTER — Ambulatory Visit: Attending: Internal Medicine | Admitting: Internal Medicine

## 2024-08-11 ENCOUNTER — Telehealth: Payer: Self-pay | Admitting: Pharmacy Technician

## 2024-08-11 VITALS — BP 120/66 | HR 60 | Ht 65.0 in | Wt 197.0 lb

## 2024-08-11 DIAGNOSIS — I471 Supraventricular tachycardia, unspecified: Secondary | ICD-10-CM | POA: Diagnosis not present

## 2024-08-11 MED ORDER — REPATHA SURECLICK 140 MG/ML ~~LOC~~ SOAJ
140.0000 mg | SUBCUTANEOUS | 1 refills | Status: AC
  Filled 2024-08-11 – 2024-08-18 (×2): qty 6, 84d supply, fill #0

## 2024-08-11 NOTE — Patient Instructions (Signed)
 Medication Instructions:  Your physician recommends that you continue on your current medications as directed. Please refer to the Current Medication list given to you today.  *If you need a refill on your cardiac medications before your next appointment, please call your pharmacy*  Lab Work: None ordered.  You may go to any [facility] Location for your lab work:  [facility] - [address] ([facility]) - [address] [phone] [address]  [place] - [address]   [place]  - [address]   [place] - [address] - [address] [employer]  - [address] ([facility]   If you have labs (blood work) drawn today and your tests are completely normal, you will receive your results only by: [employer] (if you have MyChart)  If you have any lab test that is abnormal or we need to change your treatment, we will call you or send a MyChart message to review the results.  Testing/Procedures: None ordered.  Follow-Up: At [facility], you and your health needs are our priority.  As part of our continuing mission to provide you with exceptional heart care, we have created designated Provider Care Teams.  These Care Teams include your primary Cardiologist (physician) and Advanced Practice Providers (APPs -  Physician Assistants and Nurse Practitioners) who all work together to provide you with the care you need, when you need it.  Your next appointment:   1 year(s)  The format for your next appointment:   In Person  Provider:   Donnice Primus, MD{or one of the following Advanced Practice Providers on your designated Care Team:   Charlies Arthur, PA-C Michael Andy Tillery, PA-C Brandy Ollis, NP

## 2024-08-11 NOTE — Telephone Encounter (Signed)
 Pharmacy Patient Advocate Encounter   Received notification from [facility] that prior authorization for repatha  is required/requested.   Insurance verification completed.   The patient is insured through [place].   Per test claim: PA required; PA submitted to above mentioned insurance via Latent Key/confirmation #/EOC [facility] Status is pending

## 2024-08-11 NOTE — Progress Notes (Signed)
 HPI Ms. Ripp returns for followup of SVT. She is a pleasant obese 78 yo woman with HTN, who has palpitations. She describes 2 episodes of frank syncope which occurred when she finished eating, got abdominal pain, nausea, and diaphoresis and passed out. She noted some associated palpitations. She wore a cardiac monitor and she was noted to have several hundred episodes of SVT, the longest less than a minute. She feels palpitations when she goes to bed. Since I saw her and we started low dose toprol  and flecainide , her symptoms have resolved. She feels well.  No Known Allergies   Current Outpatient Medications  Medication Sig Dispense Refill   albuterol  (VENTOLIN  HFA) 108 (90 Base) MCG/ACT inhaler Inhale 2 puffs into the lungs every 4 (four) hours as needed for wheezing or shortness of breath. 18 g 0   Evolocumab  (REPATHA  SURECLICK) 140 MG/ML SOAJ Inject 140 mg into the skin every 14 (fourteen) days. 6 mL 1   famotidine  (PEPCID ) 20 MG tablet TAKE 1 TABLET BY MOUTH ONCE DAILY AFTER SUPPER 90 tablet 3   flecainide  (TAMBOCOR ) 50 MG tablet Take 1 tablet (50 mg total) by mouth 2 (two) times daily. 180 tablet 1   fluticasone  (FLONASE ) 50 MCG/ACT nasal spray Place 2 sprays into both nostrils 2 (two) times daily. 16 g 11   ibuprofen (ADVIL) 200 MG tablet Take 400 mg by mouth.     levocetirizine (XYZAL ) 5 MG tablet Take 5 mg by mouth daily.     losartan  (COZAAR ) 25 MG tablet Take 1 tablet (25 mg total) by mouth at bedtime. 90 tablet 3   meloxicam  (MOBIC ) 15 MG tablet Take 1 tablet (15 mg total) by mouth daily. 30 tablet 3   metoprolol  succinate (TOPROL -XL) 25 MG 24 hr tablet Take 1 tablet (25 mg total) by mouth daily. Take with or immediately following a meal. 30 tablet 5   Multiple Vitamin (MULTI-VITAMINS) TABS Take by mouth.     pantoprazole  (PROTONIX ) 40 MG tablet TAKE 1 TABLET BY MOUTH ONCE DAILY 30-60  MINUTES  BEFORE  FIRST  MEAL  OF  THE  DAY 90 tablet 3   rosuvastatin  (CRESTOR ) 40 MG  tablet Take 1 tablet (40 mg total) by mouth daily. 90 tablet 3   sertraline  (ZOLOFT ) 100 MG tablet Take 1.5 tablets (150 mg total) by mouth at bedtime. 135 tablet 3   traZODone  (DESYREL ) 50 MG tablet Take 1 tablet (50 mg total) by mouth at bedtime. 90 tablet 3   Vibegron (GEMTESA) 75 MG TABS Take 1 mg by mouth.     No current facility-administered medications for this visit.     Past Medical History:  Diagnosis Date   Acute cystitis with hematuria 07/22/2018   Anxiety and depression 09/06/2018   Blood transfusion    after hysterectomy   Bronchitis, chronic (HCC) 2005   Carpal tunnel syndrome 12/24/2006   Qualifier: Diagnosis of  By: Koval Pharm D, Peter     CHOLELITHIASIS 04/25/2009   Qualifier: Diagnosis of  By: Joyice MD, Jessica     COPD (chronic obstructive pulmonary disease) (HCC)    Depression    GASTROESOPHAGEAL REFLUX, NO ESOPHAGITIS 12/24/2006   Qualifier: Diagnosis of  By: Koval Pharm D, Peter     GLOMUS TUMOR 2001   Qualifier: Diagnosis of  By: Joyice MD, Jessica     Hyperlipidemia    Hypertension    MENOPAUSAL SYNDROME 12/24/2006   Qualifier: Diagnosis of  By: Koval Pharm D, Peter  NECK MASS 12/29/2006   Qualifier: Diagnosis of  By: Georgina CMA, Ne[place]     Sciatica 02/10/2017   Screening for hypothyroidism 07/21/2017   Vocal cord paralysis 05/26/2011   RIGHT vocal cord parlaysis Seen by Dr,. CANDIE Franchot silvan at [facility].( May 03, 2011)  Placed on scopalamine patch and w f/u [facility]     ROS:   All systems reviewed and negative except as noted in the HPI.   Past Surgical History:  Procedure Laterality Date   ABDOMINAL HYSTERECTOMY     CARPAL TUNNEL RELEASE     CHOLECYSTECTOMY     LUMBAR LAMINECTOMY     (4-5)   palatal adhesion     removal of glomus vagales tumor-non cancerious     TUBAL LIGATION     UPPER GASTROINTESTINAL ENDOSCOPY  2006     Family History  Problem Relation Age of Onset   Cancer Mother        Uterus   Diabetes Mother     Hypertension Mother    Heart disease Father    Stroke Father    Hypertension Father    Diabetes Father    Obesity Sister    Diabetes Sister    Hypertension Sister    Diabetes Sister    Hypertension Sister    Diabetes Sister    Hypertension Sister    Diabetes Brother    Multiple sclerosis Brother    Diabetes Brother    Hypertension Brother    Mental illness Son        PTSD   Diabetes Son    Hypertension Son    Hyperlipidemia Son    Obesity Son    Drug abuse Son    Arthritis Son    Drug abuse Son    Hyperlipidemia Son    Colon cancer Neg Hx    Colon polyps Neg Hx    Stomach cancer Neg Hx    Esophageal cancer Neg Hx    Rectal cancer Neg Hx      Social History   Socioeconomic History   Marital status: Widowed    Spouse name: Not on file   Number of children: 5   Years of education: 14   Highest education level: Associate degree: occupational, Scientist, product/process development, or vocational program  Occupational History   Occupation: LPN    Employer: [employer]  Tobacco Use   Smoking status: Never    Passive exposure: Never   Smokeless tobacco: Never  Vaping Use   Vaping status: Never Used  Substance and Sexual Activity   Alcohol use: No   Drug use: No   Sexual activity: Yes    Birth control/protection: Post-menopausal    Comment: Hysterectomy  Other Topics Concern   Not on file  Social History Narrative   Emergency Contact: Daughter, [name] (c) [phone]   End of Life Plan: Info on Advanced Directives given   Who lives with you: Patient lives with daughter and son in [place].    Any pets: None   Exercise:Tries to exercise twice weekly   Seatbelts: Patient reports wearing her seatbelt when in vehicle.    Religous: Patient is very active in her church.          Patient has stressors with her sons. Patient reports one has had multiple strokes and one is homeless.       Patient is happy with her current living situation as this has been an issue in the past. Patient  encouraged to reach  out if/when she would like to change this.    Social Drivers of Corporate investment banker Strain: Low Risk  (12/17/2021)   Overall Financial Resource Strain (CARDIA)    Difficulty of Paying Living Expenses: Not hard at all  Food Insecurity: Food Insecurity Present (12/17/2021)   Hunger Vital Sign    Worried About Running Out of Food in the Last Year: Sometimes true    Ran Out of Food in the Last Year: Sometimes true  Transportation Needs: No Transportation Needs (12/17/2021)   PRAPARE - Administrator, Civil Service (Medical): No    Lack of Transportation (Non-Medical): No  Physical Activity: Insufficiently Active (12/17/2021)   Exercise Vital Sign    Days of Exercise per Week: 2 days    Minutes of Exercise per Session: 40 min  Stress: No Stress Concern Present (12/17/2021)   [employer] of Occupational Health - Occupational Stress Questionnaire    Feeling of Stress : Only a little  Social Connections: Moderately Integrated (12/17/2021)   Social Connection and Isolation Panel    Frequency of Communication with Friends and Family: More than three times a week    Frequency of Social Gatherings with Friends and Family: More than three times a week    Attends Religious Services: More than 4 times per year    Active Member of [employer] or Organizations: Yes    Attends Banker Meetings: More than 4 times per year    Marital Status: Widowed  Intimate Partner Violence: Not At Risk (12/17/2021)   Humiliation, Afraid, Rape, and Kick questionnaire    Fear of Current or Ex-Partner: No    Emotionally Abused: No    Physically Abused: No    Sexually Abused: No     BP 120/66 (BP Location: Left Arm, Patient Position: Sitting, Cuff Size: Large)   Pulse 60   Ht 5' 5 (1.651 m)   Wt 197 lb (89.4 kg)   SpO2 94%   BMI 32.78 kg/m   Physical Exam:  Well appearing obese woman, NAD HEENT: Unremarkable Neck:  No JVD, no thyromegally Lymphatics:  No  adenopathy Back:  No CVA tenderness Lungs:  Clear with no wheezes HEART:  Regular rate rhythm, no murmurs, no rubs, no clicks Abd:  soft, positive bowel sounds, no organomegally, no rebound, no guarding Ext:  2 plus pulses, no edema, no cyanosis, no clubbing Skin:  No rashes no nodules Neuro:  CN II through XII intact, motor grossly intact   DEVICE  Normal device function.  See PaceArt for details.   Assess/Plan:  SVT -her symptoms have resolved on low dose toprol  and flecainide . Continue. No change in meds.  Syncope - these appear neurally mediated. She will undergo watchful waiting. None since her last visit. Obesity - I have encouraged her to lose weight.    Danelle Rhyann Berton,MD

## 2024-08-11 NOTE — Telephone Encounter (Signed)
 Pharmacy Patient Advocate Encounter  Received notification from [facility] that Prior Authorization for repatha  has been APPROVED from 08/11/24 to 02/09/25   PA #/Case ID/Reference #: [id number]

## 2024-08-11 NOTE — Addendum Note (Signed)
 Addended by: DARRELL BRUCKNER on: 08/11/2024 01:45 PM   Modules accepted: Orders

## 2024-08-16 ENCOUNTER — Other Ambulatory Visit (HOSPITAL_COMMUNITY): Payer: Self-pay

## 2024-08-18 ENCOUNTER — Other Ambulatory Visit (HOSPITAL_COMMUNITY): Payer: Self-pay

## 2024-08-24 ENCOUNTER — Ambulatory Visit: Payer: Self-pay | Admitting: Pharmacist

## 2024-08-24 LAB — LIPID PANEL
Chol/HDL Ratio: 1.4 ratio (ref 0.0–4.4)
Cholesterol, Total: 63 mg/dL — ABNORMAL LOW (ref 100–199)
HDL: 44 mg/dL (ref >39)
LDL Chol Calc (NIH): 7 mg/dL (ref 0–99)
Triglycerides: 39 mg/dL (ref 0–149)
VLDL Cholesterol Cal: 12 mg/dL (ref 5–40)

## 2024-08-24 MED ORDER — ROSUVASTATIN CALCIUM 10 MG PO TABS: 10.0000 mg | ORAL_TABLET | Freq: Every day | ORAL | 3 refills | Status: DC

## 2024-08-30 ENCOUNTER — Other Ambulatory Visit: Payer: Self-pay | Admitting: Cardiology

## 2024-08-31 NOTE — Telephone Encounter (Signed)
 [facility] requesting clarification for medication rosuvastatin . Pt was taking rosuvastatin  40 mg and now pt has been prescribed rosuvastatin  10 mg. Please clarify   Ph3 [phone]

## 2024-09-08 ENCOUNTER — Ambulatory Visit

## 2024-09-08 DIAGNOSIS — Z0000 Encounter for general adult medical examination without abnormal findings: Secondary | ICD-10-CM | POA: Diagnosis not present

## 2024-09-08 NOTE — Patient Instructions (Signed)
 Ms. Sherley,  Thank you for taking the time for your Medicare Wellness Visit. I appreciate your continued commitment to your health goals. Please review the care plan we discussed, and feel free to reach out if I can assist you further.  Please note that Annual Wellness Visits do not include a physical exam. Some assessments may be limited, especially if the visit was conducted virtually. If needed, we may recommend an in-person follow-up with your provider.  Ongoing Care Seeing your primary care provider every 3 to 6 months helps us  monitor your health and provide consistent, personalized care.   Referrals If a referral was made during today's visit and you haven't received any updates within two weeks, please contact the referred provider directly to check on the status.  Recommended Screenings:  Health Maintenance  Topic Date Due   DTaP/Tdap/Td vaccine (2 - Tdap) 05/27/2009   Colon Cancer Screening  07/28/2021   Medicare Annual Wellness Visit  12/17/2022   COVID-19 Vaccine (7 - 2025-26 season) 06/27/2024   Pneumococcal Vaccine for age over 4  Completed   Flu Shot  Completed   DEXA scan (bone density measurement)  Completed   Hepatitis C Screening  Completed   Zoster (Shingles) Vaccine  Completed   Meningitis B Vaccine  Aged Out   Breast Cancer Screening  Discontinued       09/08/2024    2:29 PM  Advanced Directives  Does Patient Have a Medical Advance Directive? No  Would patient like information on creating a medical advance directive? No - Patient declined    Vision: Annual vision screenings are recommended for early detection of glaucoma, cataracts, and diabetic retinopathy. These exams can also reveal signs of chronic conditions such as diabetes and high blood pressure.  Dental: Annual dental screenings help detect early signs of oral cancer, gum disease, and other conditions linked to overall health, including heart disease and diabetes.  Please see the attached  documents for additional preventive care recommendations.

## 2024-09-08 NOTE — Progress Notes (Signed)
 Chief Complaint  Patient presents with   Medicare Wellness     Subjective:   Andrea Buck is a 78 y.o. female who presents for a Medicare Annual Wellness Visit.  Allergies (verified) Patient has no known allergies.   History: Past Medical History:  Diagnosis Date   Acute cystitis with hematuria 07/22/2018   Anxiety and depression 09/06/2018   Blood transfusion    after hysterectomy   Bronchitis, chronic (HCC) 2005   Carpal tunnel syndrome 12/24/2006   Qualifier: Diagnosis of  By: Koval Pharm D, Peter     CHOLELITHIASIS 04/25/2009   Qualifier: Diagnosis of  By: Joyice MD, Jessica     COPD (chronic obstructive pulmonary disease) (HCC)    Depression    GASTROESOPHAGEAL REFLUX, NO ESOPHAGITIS 12/24/2006   Qualifier: Diagnosis of  By: Amalia Berdine JONETTA Maude LAURIE TUMOR 2001   Qualifier: Diagnosis of  By: Joyice MD, Jessica     Hyperlipidemia    Hypertension    MENOPAUSAL SYNDROME 12/24/2006   Qualifier: Diagnosis of  By: Amalia Berdine JONETTA Maude     NECK MASS 12/29/2006   Qualifier: Diagnosis of  By: Georgina CMA, [place]     Sciatica 02/10/2017   Screening for hypothyroidism 07/21/2017   Vocal cord paralysis 05/26/2011   RIGHT vocal cord parlaysis Seen by Dr,. CANDIE Franchot silvan at [facility].( May 03, 2011)  Placed on scopalamine patch and w f/u [facility]    Past Surgical History:  Procedure Laterality Date   ABDOMINAL HYSTERECTOMY     CARPAL TUNNEL RELEASE     CHOLECYSTECTOMY     LUMBAR LAMINECTOMY     (4-5)   palatal adhesion     removal of glomus vagales tumor-non cancerious     TUBAL LIGATION     UPPER GASTROINTESTINAL ENDOSCOPY  2006   Family History  Problem Relation Age of Onset   Cancer Mother        Uterus   Diabetes Mother    Hypertension Mother    Heart disease Father    Stroke Father    Hypertension Father    Diabetes Father    Obesity Sister    Diabetes Sister    Hypertension Sister    Diabetes Sister    Hypertension Sister    Diabetes  Sister    Hypertension Sister    Diabetes Brother    Multiple sclerosis Brother    Diabetes Brother    Hypertension Brother    Mental illness Son        PTSD   Diabetes Son    Hypertension Son    Hyperlipidemia Son    Obesity Son    Drug abuse Son    Arthritis Son    Drug abuse Son    Hyperlipidemia Son    Colon cancer Neg Hx    Colon polyps Neg Hx    Stomach cancer Neg Hx    Esophageal cancer Neg Hx    Rectal cancer Neg Hx    Social History   Occupational History   Occupation: LPN    Employer: [employer]  Tobacco Use   Smoking status: Never    Passive exposure: Never   Smokeless tobacco: Never  Vaping Use   Vaping status: Never Used  Substance and Sexual Activity   Alcohol use: No   Drug use: No   Sexual activity: Yes    Birth control/protection: Post-menopausal    Comment: Hysterectomy   Tobacco Counseling Counseling  given: Not Answered  SDOH Screenings   Food Insecurity: Food Insecurity Present (09/08/2024)  Housing: Unknown (09/08/2024)  Transportation Needs: No Transportation Needs (09/08/2024)  Utilities: Not At Risk (09/08/2024)  Alcohol Screen: Low Risk  (09/08/2024)  Depression (PHQ2-9): Medium Risk (09/08/2024)  Financial Resource Strain: Low Risk  (09/08/2024)  Physical Activity: Insufficiently Active (09/08/2024)  Social Connections: Moderately Isolated (09/08/2024)  Stress: Stress Concern Present (09/08/2024)  Tobacco Use: Low Risk  (09/08/2024)  Health Literacy: Adequate Health Literacy (09/08/2024)   See flowsheets for full screening details  Depression Screen PHQ 2 & 9 Depression Scale- Over the past 2 weeks, how often have you been bothered by any of the following problems? Little interest or pleasure in doing things: 1 Feeling down, depressed, or hopeless (PHQ Adolescent also includes...irritable): 3 PHQ-2 Total Score: 4 Trouble falling or staying asleep, or sleeping too much: 2 Feeling tired or having little energy: 2 Poor appetite  or overeating (PHQ Adolescent also includes...weight loss): 0 Feeling bad about yourself - or that you are a failure or have let yourself or your family down: 0 Trouble concentrating on things, such as reading the newspaper or watching television (PHQ Adolescent also includes...like school work): 0 Moving or speaking so slowly that other people could have noticed. Or the opposite - being so fidgety or restless that you have been moving around a lot more than usual: 0 Thoughts that you would be better off dead, or of hurting yourself in some way: 0 PHQ-9 Total Score: 8 If you checked off any problems, how difficult have these problems made it for you to do your work, take care of things at home, or get along with other people?: Somewhat difficult  Depression Treatment Depression Interventions/Treatment : Currently on Treatment     Goals Addressed             This Visit's Progress    Patient Stated       09/08/2024, wants to lose weight and be more active       Visit info / Clinical Intake: Medicare Wellness Visit Type:: Subsequent Annual Wellness Visit Persons participating in visit:: patient Medicare Wellness Visit Mode:: Telephone If telephone:: video declined Because this visit was a virtual/telehealth visit:: unable to obtan vitals due to lack of equipment If Telephone or Video please confirm:: I connected with the patient using audio enabled telemedicine application and verified that I am speaking with the correct person using two identifiers; I discussed the limitations of evaluation and management by telemedicine; The patient expressed understanding and agreed to proceed Patient Location:: home Provider Location:: home office Information given by:: patient Interpreter Needed?: No Pre-visit prep was completed: yes AWV questionnaire completed by patient prior to visit?: no Living arrangements:: with family/others Patient's Overall Health Status Rating: very good Typical  amount of pain: some Does pain affect daily life?: no Are you currently prescribed opioids?: no  Dietary Habits and Nutritional Risks How many meals a day?: 2 Eats fruit and vegetables daily?: yes Most meals are obtained by: preparing own meals; eating out In the last 2 weeks, have you had any of the following?: none Diabetic:: no  Functional Status Activities of Daily Living (to include ambulation/medication): Independent Ambulation: Independent Medication Administration: Independent Home Management: Independent Manage your own finances?: yes Primary transportation is: driving Concerns about vision?: (!) yes (has blocked tear ducts) Concerns about hearing?: no  Fall Screening Falls in the past year?: 0 Number of falls in past year: 0 Was there an injury with  Fall?: 0 Fall Risk Category Calculator: 0 Patient Fall Risk Level: Low Fall Risk  Fall Risk Patient at Risk for Falls Due to: Medication side effect Fall risk Follow up: Falls prevention discussed; Falls evaluation completed  Home and Transportation Safety: All rugs have non-skid backing?: yes All stairs or steps have railings?: N/A, no stairs Grab bars in the bathtub or shower?: (!) no Have non-skid surface in bathtub or shower?: yes Good home lighting?: yes Regular seat belt use?: yes Hospital stays in the last year:: no  Cognitive Assessment Difficulty concentrating, remembering, or making decisions? : yes (trouble concentrating sometimes) Will 6CIT or Mini Cog be Completed: yes What year is it?: 0 points What month is it?: 0 points Give patient an address phrase to remember (5 components): [address] About what time is it?: 0 points Count backwards from 20 to 1: 2 points Say the months of the year in reverse: 0 points Repeat the address phrase from earlier: 0 points 6 CIT Score: 2 points  Advance Directives (For Healthcare) Does Patient Have a Medical Advance Directive?: No Would patient like  information on creating a medical advance directive?: No - Patient declined  Reviewed/Updated  Reviewed/Updated: Reviewed All (Medical, Surgical, Family, Medications, Allergies, Care Teams, Patient Goals)        Objective:    Today's Vitals   There is no height or weight on file to calculate BMI.  Current Medications (verified) Outpatient Encounter Medications as of 09/08/2024  Medication Sig   albuterol  (VENTOLIN  HFA) 108 (90 Base) MCG/ACT inhaler Inhale 2 puffs into the lungs every 4 (four) hours as needed for wheezing or shortness of breath.   Evolocumab  (REPATHA  SURECLICK) 140 MG/ML SOAJ Inject 140 mg into the skin every 14 (fourteen) days.   famotidine  (PEPCID ) 20 MG tablet TAKE 1 TABLET BY MOUTH ONCE DAILY AFTER SUPPER   flecainide  (TAMBOCOR ) 50 MG tablet Take 1 tablet (50 mg total) by mouth 2 (two) times daily.   fluticasone  (FLONASE ) 50 MCG/ACT nasal spray Place 2 sprays into both nostrils 2 (two) times daily.   ibuprofen (ADVIL) 200 MG tablet Take 400 mg by mouth.   levocetirizine (XYZAL ) 5 MG tablet Take 5 mg by mouth daily.   losartan  (COZAAR ) 25 MG tablet Take 1 tablet (25 mg total) by mouth at bedtime.   meloxicam  (MOBIC ) 15 MG tablet Take 1 tablet (15 mg total) by mouth daily.   metoprolol  succinate (TOPROL -XL) 25 MG 24 hr tablet Take 1 tablet (25 mg total) by mouth daily. Take with or immediately following a meal.   Multiple Vitamin (MULTI-VITAMINS) TABS Take by mouth.   pantoprazole  (PROTONIX ) 40 MG tablet TAKE 1 TABLET BY MOUTH ONCE DAILY 30-60  MINUTES  BEFORE  FIRST  MEAL  OF  THE  DAY   rosuvastatin  (CRESTOR ) 10 MG tablet Take 1 tablet (10 mg total) by mouth daily.   sertraline  (ZOLOFT ) 100 MG tablet Take 1.5 tablets (150 mg total) by mouth at bedtime.   traZODone  (DESYREL ) 50 MG tablet Take 1 tablet (50 mg total) by mouth at bedtime.   Vibegron (GEMTESA) 75 MG TABS Take 1 mg by mouth.   No facility-administered encounter medications on file as of 09/08/2024.    Hearing/Vision screen Hearing Screening - Comments:: Denies hearing issues Vision Screening - Comments:: regular eye exams, Dr. Scarlet Immunizations and Health Maintenance Health Maintenance  Topic Date Due   DTaP/Tdap/Td (2 - Tdap) 05/27/2009   Colonoscopy  07/28/2021   COVID-19 Vaccine (7 -  2025-26 season) 06/27/2024   Medicare Annual Wellness (AWV)  09/08/2025   Pneumococcal Vaccine: 50+ Years  Completed   Influenza Vaccine  Completed   DEXA SCAN  Completed   Hepatitis C Screening  Completed   Zoster Vaccines- Shingrix   Completed   Meningococcal B Vaccine  Aged Out   Mammogram  Discontinued        Assessment/Plan:  This is a routine wellness examination for Sophi.  Patient Care Team: Chandra Toribio POUR, MD as PCP - General (Family Medicine) Elmira Newman PARAS, MD as PCP - Cardiology (Cardiology) Brien Garnette Pepper, MD (Otolaryngology) Darlean Ozell NOVAK, MD as Consulting Physician (Pulmonary Disease) Leila Bound, OD (Optometry)  I have personally reviewed and noted the following in the patient's chart:   Medical and social history Use of alcohol, tobacco or illicit drugs  Current medications and supplements including opioid prescriptions. Functional ability and status Nutritional status Physical activity Advanced directives List of other physicians Hospitalizations, surgeries, and ER visits in previous 12 months Vitals Screenings to include cognitive, depression, and falls Referrals and appointments  No orders of the defined types were placed in this encounter.  In addition, I have reviewed and discussed with patient certain preventive protocols, quality metrics, and best practice recommendations. A written personalized care plan for preventive services as well as general preventive health recommendations were provided to patient.   Ardella FORBES Dawn, LPN   88/86/7974   Return in 1 year (on 09/08/2025).  After Visit Summary: (MyChart) Due to this being a  telephonic visit, the after visit summary with patients personalized plan was offered to patient via MyChart   Nurse Notes: Patient says she is going to call to schedule colonoscopy. Due for TDAP and covid vaccines. Gave some information about food pantries in her area. Concerned about stress, but is going out of town and will call to make an appointment to discuss medication change or update.

## 2024-10-05 ENCOUNTER — Other Ambulatory Visit: Payer: Self-pay | Admitting: Family Medicine

## 2024-10-05 NOTE — Telephone Encounter (Signed)
 Copied from CRM #[id number]. Topic: Clinical - Medication Refill >> Oct 05, 2024 12:33 PM Zy'onna H wrote: Medication: Sam from [facility] called to request a medication refill for the patient Fluticasone  (FLONASE ) 50 MCG/ACT nasal spray  Has the patient contacted their pharmacy? Yes (Agent: If no, request that the patient contact the pharmacy for the refill. If patient does not wish to contact the pharmacy document the reason why and proceed with request.) (Agent: If yes, when and what did the pharmacy advise?)  This is the patient's preferred pharmacy:   [facility] - [place], [place] - [address] [place] [address] Phone: [phone] Fax: [phone]  Is this the correct pharmacy for this prescription? Yes If no, delete pharmacy and type the correct one.   Has the prescription been filled recently? Yes  Is the patient out of the medication? No  Has the patient been seen for an appointment in the last year OR does the patient have an upcoming appointment? Yes  Can we respond through MyChart? Yes  Agent: Please be advised that Rx refills may take up to 3 business days. We ask that you follow-up with your pharmacy.

## 2024-10-06 MED ORDER — FLUTICASONE PROPIONATE 50 MCG/ACT NA SUSP: 2.0000 | Freq: Two times a day (BID) | NASAL | 11 refills | Status: AC

## 2024-10-12 ENCOUNTER — Ambulatory Visit: Attending: Cardiovascular Disease | Admitting: Cardiology

## 2024-10-12 ENCOUNTER — Other Ambulatory Visit: Payer: Self-pay | Admitting: Family Medicine

## 2024-10-12 NOTE — Telephone Encounter (Signed)
 Copied from CRM #[id number]. Topic: Clinical - Medication Refill >> Oct 12, 2024  2:45 PM Sophia H wrote: Medication: meloxicam  (MOBIC ) 15 MG tablet   Has the patient contacted their pharmacy? Yes, pharmacy calling on behalf of patient to request   This is the patient's preferred pharmacy:   [facility] - [place], [place] - [address] [place] [address] Phone: [phone] Fax: [phone]  Is this the correct pharmacy for this prescription? Yes If no, delete pharmacy and type the correct one.   Has the prescription been filled recently? Yes  Is the patient out of the medication? Yes  Has the patient been seen for an appointment in the last year OR does the patient have an upcoming appointment? Yes, seen October 1   Can we respond through MyChart? Yes  Agent: Please be advised that Rx refills may take up to 3 business days. We ask that you follow-up with your pharmacy.

## 2024-10-12 NOTE — Progress Notes (Deleted)
°  Cardiology Office Note:  .   Date:  10/12/2024  ID:  KISSA CAMPOY, DOB September 23, 1946, MRN [id number] PCP: Chandra Toribio POUR, MD  [facility] Providers Cardiologist:  Newman Lawrence, MD PCP: Chandra Toribio POUR, MD  No chief complaint on file.    Andrea Buck is a 78 y.o. female with hypertension, hyperlipidemia, prediabetes, SVT, syncope    History of Present Illness Started on diltiazem  at last visit, did not tolerate due to complaints of dizziness.  She has not had any recurrent syncope, but felt lightheadedness episodes as described.  These have improved after stopping diltiazem .  Reviewed recent lipid panel results with the patient, details below.      There were no vitals filed for this visit.      Review of Systems  Cardiovascular:  Positive for palpitations. Negative for chest pain, dyspnea on exertion, leg swelling and syncope.        Studies Reviewed: SABRA        EKG 01/19/2024: Normal sinus rhythm Indeterminate axis When compared with ECG of 25-Aug-2022 15:07, No significant change was found    Labs 02/2024: Chol 201, TG 68, HDL 50, LDL 139 HbA1C 6.0%  07/2023-/2025: Chol 242, TG 67, HDL 50, LDL 181 HbA1C 6.1% Hb 13.1 Cr 0.81  Independently interpreted Echocardiogram 07/2023: EF 55 to 60%.  Grade 1 diastolic dysfunction.  GLS -17.7%, normal. Normal RV systolic function. Aortic sclerosis.  Long-term monitor 09/2023: HR 51 - 210, average 73 bpm. 252 SVT episodes, longest 26 seconds with an average rate of 148 bpm. Rhythm strip suggests salvos of AT. Occasional supraventricular ectopy, 2.6% Rare ventricular ectopy. No atrial fibrillation.   Physical Exam Vitals and nursing note reviewed.  Constitutional:      General: She is not in acute distress. Neck:     Vascular: No JVD.  Cardiovascular:     Rate and Rhythm: Normal rate and regular rhythm.     Heart sounds: Normal heart sounds. No murmur heard. Pulmonary:     Effort:  Pulmonary effort is normal.     Breath sounds: Normal breath sounds. No wheezing or rales.  Musculoskeletal:     Right lower leg: No edema.     Left lower leg: No edema.      VISIT DIAGNOSES: No diagnosis found.     Andrea Buck is a 78 y.o. female with hypertension, hyperlipidemia, prediabetes, SVT, syncope   Assessment & Plan Supraventricular tachycardia (SVT): Episodes of palpitations confirmed as SVT. SVT requires management but is not linked to syncope. She did not tolerate diltiazem  due to lightheadedness. Started metoprolol  succinate 25 mg daily.  In addition, will refer to EP for consideration for ablation.   Vasovagal syncope: Syncope episodes associated with bowel movement urge, suggestive of vasovagal syncope, unlikely to be related to SVT. Recommend hydration, complaints of, counterpressure maneuvers.  Mixed hyperlipidemia: LDL down from 180 to 140 on Crestor  5 mg daily.  Given prediabetes and very high LDL, referred to lipid clinic for evaluation for injectable agents.        No orders of the defined types were placed in this encounter.    F/u in 6 months  Signed, Newman JINNY Lawrence, MD

## 2024-10-26 ENCOUNTER — Other Ambulatory Visit: Payer: Self-pay | Admitting: Family Medicine

## 2024-10-26 NOTE — Telephone Encounter (Signed)
 Copied from CRM #[id number]. Topic: Clinical - Medication Refill >> Oct 26, 2024  1:17 PM Pinkey ORN wrote: Medication: meloxicam  (MOBIC ) 15 MG tablet  Has the patient contacted their pharmacy? Yes (Agent: If no, request that the patient contact the pharmacy for the refill. If patient does not wish to contact the pharmacy document the reason why and proceed with request.) (Agent: If yes, when and what did the pharmacy advise?)  This is the patient's preferred pharmacy:   [facility] - [place], [place] - [address] [place] [address] Phone: [phone] Fax: [phone]  Is this the correct pharmacy for this prescription? Yes If no, delete pharmacy and type the correct one.   Has the prescription been filled recently? No  Is the patient out of the medication? No  Has the patient been seen for an appointment in the last year OR does the patient have an upcoming appointment? Yes  Can we respond through MyChart? Yes  Agent: Please be advised that Rx refills may take up to 3 business days. We ask that you follow-up with your pharmacy.

## 2024-11-02 ENCOUNTER — Other Ambulatory Visit: Payer: Self-pay | Admitting: Family Medicine

## 2024-11-02 MED ORDER — MELOXICAM 15 MG PO TABS: 15.0000 mg | ORAL_TABLET | Freq: Every day | ORAL | 11 refills | Status: AC

## 2024-11-02 NOTE — Telephone Encounter (Signed)
 Copied from CRM #[id number]. Topic: Clinical - Medication Refill >> Nov 02, 2024 10:44 AM Zy'onna H wrote: Medication: meloxicam  (MOBIC ) 15 MG tablet  Has the patient contacted their pharmacy? Yes (Agent: If no, request that the patient contact the pharmacy for the refill. If patient does not wish to contact the pharmacy document the reason why and proceed with request.) (Agent: If yes, when and what did the pharmacy advise?)  This is the patient's preferred pharmacy:  [facility] - [place], [place] - [address] [place] [address] Phone: [phone] Fax: [phone]  Is this the correct pharmacy for this prescription? Yes If no, delete pharmacy and type the correct one.   Has the prescription been filled recently? Yes  Is the patient out of the medication? No  Has the patient been seen for an appointment in the last year OR does the patient have an upcoming appointment? Yes  Can we respond through MyChart? Yes  Agent: Please be advised that Rx refills may take up to 3 business days. We ask that you follow-up with your pharmacy.

## 2024-11-02 NOTE — Telephone Encounter (Signed)
 Copied from CRM #[id number]. Topic: Clinical - Medication Refill >> Nov 02, 2024 10:44 AM Zy'onna H wrote: Medication: meloxicam  (MOBIC ) 15 MG tablet  Has the patient contacted their pharmacy? Yes (Agent: If no, request that the patient contact the pharmacy for the refill. If patient does not wish to contact the pharmacy document the reason why and proceed with request.) (Agent: If yes, when and what did the pharmacy advise?)  This is the patient's preferred pharmacy:  [facility] [address], [place] - [address] [place] [address] Phone: [phone] Fax: [phone]  [facility] - [place], [place] - [address] [place] [address] Phone: [phone] Fax: [phone]  Is this the correct pharmacy for this prescription? Yes If no, delete pharmacy and type the correct one.   Has the prescription been filled recently? Yes  Is the patient out of the medication? No  Has the patient been seen for an appointment in the last year OR does the patient have an upcoming appointment? Yes  Can we respond through MyChart? Yes  Agent: Please be advised that Rx refills may take up to 3 business days. We ask that you follow-up with your pharmacy.

## 2024-11-08 ENCOUNTER — Encounter: Payer: Self-pay | Admitting: Family Medicine

## 2024-11-08 ENCOUNTER — Ambulatory Visit: Admitting: Family Medicine

## 2024-11-08 VITALS — BP 114/68 | HR 64 | Ht 65.0 in | Wt 188.0 lb

## 2024-11-08 DIAGNOSIS — I471 Supraventricular tachycardia, unspecified: Secondary | ICD-10-CM | POA: Diagnosis not present

## 2024-11-08 DIAGNOSIS — M25461 Effusion, right knee: Secondary | ICD-10-CM | POA: Diagnosis not present

## 2024-11-08 DIAGNOSIS — Z6832 Body mass index (BMI) 32.0-32.9, adult: Secondary | ICD-10-CM | POA: Diagnosis not present

## 2024-11-08 DIAGNOSIS — M1711 Unilateral primary osteoarthritis, right knee: Secondary | ICD-10-CM

## 2024-11-08 DIAGNOSIS — M25561 Pain in right knee: Secondary | ICD-10-CM | POA: Diagnosis not present

## 2024-11-08 DIAGNOSIS — E782 Mixed hyperlipidemia: Secondary | ICD-10-CM | POA: Diagnosis not present

## 2024-11-08 DIAGNOSIS — E559 Vitamin D deficiency, unspecified: Secondary | ICD-10-CM

## 2024-11-08 DIAGNOSIS — E66811 Obesity, class 1: Secondary | ICD-10-CM | POA: Diagnosis not present

## 2024-11-08 MED ORDER — OXYCODONE HCL 5 MG PO TABS: 2.5000 mg | ORAL_TABLET | Freq: Three times a day (TID) | ORAL | 0 refills | Status: AC | PRN

## 2024-11-08 NOTE — Progress Notes (Unsigned)
" ° °  Established Patient Office Visit  Subjective   Patient ID: Andrea Buck, female    DOB: Aug 06, 1946  Age: 79 y.o. MRN: [id number]  Chief Complaint  Patient presents with   Knee Pain    Discussed the use of AI scribe software for clinical note transcription with the patient, who gave verbal consent to proceed.  History of Present Illness   Andrea Buck is a 79 year old female with knee arthritis who presents with right knee pain and swelling.  She reports sudden onset of severe right knee pain and swelling. Pain sometimes causes locking or buckling of the knee. She recalls a fall 3 to 4 months ago when the knee buckled while going down from a storage building, which worsened symptoms.  She has chronic right knee arthritis. Current pain is localized to the medial knee and described as burning with an abnormal sensation to touch.  She is using Tylenol and an unidentified topical medication with partial relief. She is also taking meloxicam , which she finds ineffective. She previously used tramadol and oxycodone  for postoperative pain but not recently.  Her medications are otherwise unchanged. She continues flecainide  for heart rate management with good control and no recent fainting episodes.          The ASCVD Risk score (Arnett DK, et al., 2019) failed to calculate for the following reasons:   The valid total cholesterol range is 130 to 320 mg/dL  Health Maintenance Due  Topic Date Due   DTaP/Tdap/Td (2 - Tdap) 05/27/2009   Colonoscopy  07/28/2021   COVID-19 Vaccine (7 - 2025-26 season) 06/27/2024      Objective:     BP 114/68   Pulse 64   Ht 5' 5 (1.651 m)   Wt 188 lb (85.3 kg)   SpO2 99%   BMI 31.28 kg/m  {Vitals History (Optional):23777}  Physical Exam   MSK: Small cystic structure on right knee, non-tender to palpation. Possible meniscus wear or tear in right knee.        No results found for any visits on 11/08/24.      Assessment & Plan:    Primary osteoarthritis of right knee Assessment & Plan: Chronic right knee pain with recent exacerbation, swelling, and burning sensation. Possible bursitis indicated by fluid pocket. Differential includes arthritis, nerve impingement, and meniscal tear. Previous steroid injection was poorly tolerated. Current pain management with Tylenol and meloxicam  is insufficient. - Ordered x-rays of the right knee. - Ordered uric acid level to rule out gout. - Prescribed oxycodone  for short-term pain management. - Continue current pain management with Tylenol and meloxicam . - Consider referral to sports medicine for ultrasound-guided injection if needed.  Orders: -     DG Knee Bilateral Standing AP; Future -     DG Knee 1-2 Views Right; Future  Other orders -     oxyCODONE  HCl; Take 0.5-1 tablets (2.5-5 mg total) by mouth every 8 (eight) hours as needed for severe pain (pain score 7-10) or breakthrough pain.  Dispense: 10 tablet; Refill: 0       Return in about 4 weeks (around 12/06/2024) for f/u knee pain and chronic issues.    Toribio MARLA Slain, MD  "

## 2024-11-08 NOTE — Patient Instructions (Signed)
 It was nice to see you today,  We addressed the following topics today: - go to [facility] on [address] to get your knee xrays.  - continue tylenol, meloxicam , and topical pain relievers - ues the oxycodone  for severe pain.  Try using a half of a tablet at first.    Have a great day,  Rolan Slain, MD

## 2024-11-08 NOTE — Assessment & Plan Note (Signed)
 Chronic right knee pain with recent exacerbation, swelling, and burning sensation. Possible bursitis indicated by fluid pocket. Differential includes arthritis, nerve impingement, and meniscal tear. Previous steroid injection was poorly tolerated. Current pain management with Tylenol and meloxicam  is insufficient. - Ordered x-rays of the right knee. - Ordered uric acid level to rule out gout. - Prescribed oxycodone  for short-term pain management. - Continue current pain management with Tylenol and meloxicam . - Consider referral to sports medicine for ultrasound-guided injection if needed.

## 2024-11-11 ENCOUNTER — Ambulatory Visit (INDEPENDENT_AMBULATORY_CARE_PROVIDER_SITE_OTHER): Admitting: Otolaryngology

## 2024-11-16 ENCOUNTER — Ambulatory Visit
Admission: RE | Admit: 2024-11-16 | Discharge: 2024-11-16 | Disposition: A | Source: Ambulatory Visit | Attending: Family Medicine

## 2024-11-16 DIAGNOSIS — M1711 Unilateral primary osteoarthritis, right knee: Secondary | ICD-10-CM

## 2024-11-18 ENCOUNTER — Other Ambulatory Visit

## 2024-11-18 DIAGNOSIS — E782 Mixed hyperlipidemia: Secondary | ICD-10-CM

## 2024-11-18 DIAGNOSIS — E66811 Obesity, class 1: Secondary | ICD-10-CM

## 2024-11-18 DIAGNOSIS — M25561 Pain in right knee: Secondary | ICD-10-CM

## 2024-11-18 DIAGNOSIS — E559 Vitamin D deficiency, unspecified: Secondary | ICD-10-CM

## 2024-11-18 DIAGNOSIS — I471 Supraventricular tachycardia, unspecified: Secondary | ICD-10-CM

## 2024-11-19 LAB — COMPREHENSIVE METABOLIC PANEL WITH GFR
ALT: 10 [IU]/L (ref 0–32)
AST: 14 [IU]/L (ref 0–40)
Albumin: 3.9 g/dL (ref 3.8–4.8)
Alkaline Phosphatase: 71 [IU]/L (ref 49–135)
BUN/Creatinine Ratio: 22 (ref 12–28)
BUN: 18 mg/dL (ref 8–27)
Bilirubin Total: 0.3 mg/dL (ref 0.0–1.2)
CO2: 23 mmol/L (ref 20–29)
Calcium: 9.4 mg/dL (ref 8.7–10.3)
Chloride: 104 mmol/L (ref 96–106)
Creatinine, Ser: 0.81 mg/dL (ref 0.57–1.00)
Globulin, Total: 2.3 g/dL (ref 1.5–4.5)
Glucose: 102 mg/dL — ABNORMAL HIGH (ref 70–99)
Potassium: 4.1 mmol/L (ref 3.5–5.2)
Sodium: 139 mmol/L (ref 134–144)
Total Protein: 6.2 g/dL (ref 6.0–8.5)
eGFR: 74 mL/min/{1.73_m2}

## 2024-11-19 LAB — CBC WITH DIFFERENTIAL/PLATELET
Basophils Absolute: 0 10*3/uL (ref 0.0–0.2)
Basos: 1 %
EOS (ABSOLUTE): 0.3 10*3/uL (ref 0.0–0.4)
Eos: 8 %
Hematocrit: 39.3 % (ref 34.0–46.6)
Hemoglobin: 12.4 g/dL (ref 11.1–15.9)
Immature Grans (Abs): 0 10*3/uL (ref 0.0–0.1)
Immature Granulocytes: 0 %
Lymphocytes Absolute: 1.7 10*3/uL (ref 0.7–3.1)
Lymphs: 46 %
MCH: 30.2 pg (ref 26.6–33.0)
MCHC: 31.6 g/dL (ref 31.5–35.7)
MCV: 96 fL (ref 79–97)
Monocytes Absolute: 0.3 10*3/uL (ref 0.1–0.9)
Monocytes: 8 %
Neutrophils Absolute: 1.4 10*3/uL (ref 1.4–7.0)
Neutrophils: 37 %
Platelets: 193 10*3/uL (ref 150–450)
RBC: 4.1 x10E6/uL (ref 3.77–5.28)
RDW: 12.9 % (ref 11.7–15.4)
WBC: 3.8 10*3/uL (ref 3.4–10.8)

## 2024-11-19 LAB — HEMOGLOBIN A1C
Est. average glucose Bld gHb Est-mCnc: 126 mg/dL
Hgb A1c MFr Bld: 6 % — ABNORMAL HIGH (ref 4.8–5.6)

## 2024-11-19 LAB — LIPID PANEL
Chol/HDL Ratio: 1.6 ratio (ref 0.0–4.4)
Cholesterol, Total: 94 mg/dL — ABNORMAL LOW (ref 100–199)
HDL: 57 mg/dL
LDL Chol Calc (NIH): 24 mg/dL (ref 0–99)
Triglycerides: 53 mg/dL (ref 0–149)
VLDL Cholesterol Cal: 13 mg/dL (ref 5–40)

## 2024-11-19 LAB — VITAMIN D 25 HYDROXY (VIT D DEFICIENCY, FRACTURES): Vit D, 25-Hydroxy: 22.9 ng/mL — ABNORMAL LOW (ref 30.0–100.0)

## 2024-11-19 LAB — TSH: TSH: 1.79 u[IU]/mL (ref 0.450–4.500)

## 2024-11-21 ENCOUNTER — Ambulatory Visit: Payer: Self-pay | Admitting: Family Medicine

## 2024-11-22 ENCOUNTER — Other Ambulatory Visit: Payer: Self-pay | Admitting: Family Medicine

## 2024-11-22 MED ORDER — PREDNISONE 10 MG PO TABS: 30.0000 mg | ORAL_TABLET | Freq: Every day | ORAL | 0 refills | Status: AC

## 2024-11-22 NOTE — Progress Notes (Signed)
 Called patient LVM to contact the office if pt calls please advise

## 2024-12-06 ENCOUNTER — Ambulatory Visit: Admitting: Family Medicine
